# Patient Record
Sex: Male | Born: 1945
Health system: Southern US, Community
[De-identification: ages and names within clinical notes are randomized; demographics above are authoritative.]

## PROBLEM LIST (undated history)

## (undated) DIAGNOSIS — J449 Chronic obstructive pulmonary disease, unspecified: Secondary | ICD-10-CM

## (undated) DIAGNOSIS — I700 Atherosclerosis of aorta: Secondary | ICD-10-CM

## (undated) DIAGNOSIS — M1990 Unspecified osteoarthritis, unspecified site: Secondary | ICD-10-CM

## (undated) DIAGNOSIS — I639 Cerebral infarction, unspecified: Secondary | ICD-10-CM

## (undated) DIAGNOSIS — N189 Chronic kidney disease, unspecified: Secondary | ICD-10-CM

## (undated) DIAGNOSIS — F32A Depression, unspecified: Secondary | ICD-10-CM

## (undated) DIAGNOSIS — E785 Hyperlipidemia, unspecified: Secondary | ICD-10-CM

## (undated) DIAGNOSIS — C801 Malignant (primary) neoplasm, unspecified: Secondary | ICD-10-CM

## (undated) DIAGNOSIS — M549 Dorsalgia, unspecified: Secondary | ICD-10-CM

## (undated) DIAGNOSIS — J189 Pneumonia, unspecified organism: Secondary | ICD-10-CM

## (undated) DIAGNOSIS — M109 Gout, unspecified: Secondary | ICD-10-CM

## (undated) DIAGNOSIS — I10 Essential (primary) hypertension: Secondary | ICD-10-CM

## (undated) HISTORY — PX: CATARACT EXTRACTION W/ INTRAOCULAR LENS IMPLANT: SHX1309

## (undated) HISTORY — PX: OTHER SURGICAL HISTORY: SHX169

## (undated) HISTORY — PX: FRACTURE SURGERY: SHX138

## (undated) HISTORY — DX: Cerebral infarction, unspecified: I63.9

---

## 1999-09-06 ENCOUNTER — Ambulatory Visit (HOSPITAL_COMMUNITY): Admission: RE | Admit: 1999-09-06 | Discharge: 1999-09-06 | Payer: Self-pay | Admitting: *Deleted

## 2003-02-12 ENCOUNTER — Ambulatory Visit: Admission: RE | Admit: 2003-02-12 | Discharge: 2003-02-12 | Payer: Self-pay | Admitting: Family Medicine

## 2003-11-27 ENCOUNTER — Inpatient Hospital Stay (HOSPITAL_COMMUNITY): Admission: EM | Admit: 2003-11-27 | Discharge: 2003-12-06 | Payer: Self-pay

## 2003-12-03 ENCOUNTER — Ambulatory Visit: Payer: Self-pay | Admitting: Physical Medicine & Rehabilitation

## 2004-01-14 ENCOUNTER — Encounter: Admission: RE | Admit: 2004-01-14 | Discharge: 2004-01-14 | Payer: Self-pay | Admitting: Family Medicine

## 2005-07-08 IMAGING — CR DG CHEST 1V PORT
1 series · 1 of 1 positions shown · non-contrast
Comparison: 27 November, 2003.

CLINICAL DATA: Question pneumonia.
 CHEST PORTABLE ? 11/29/03 AT 4942 HOURS

[view not recorded]
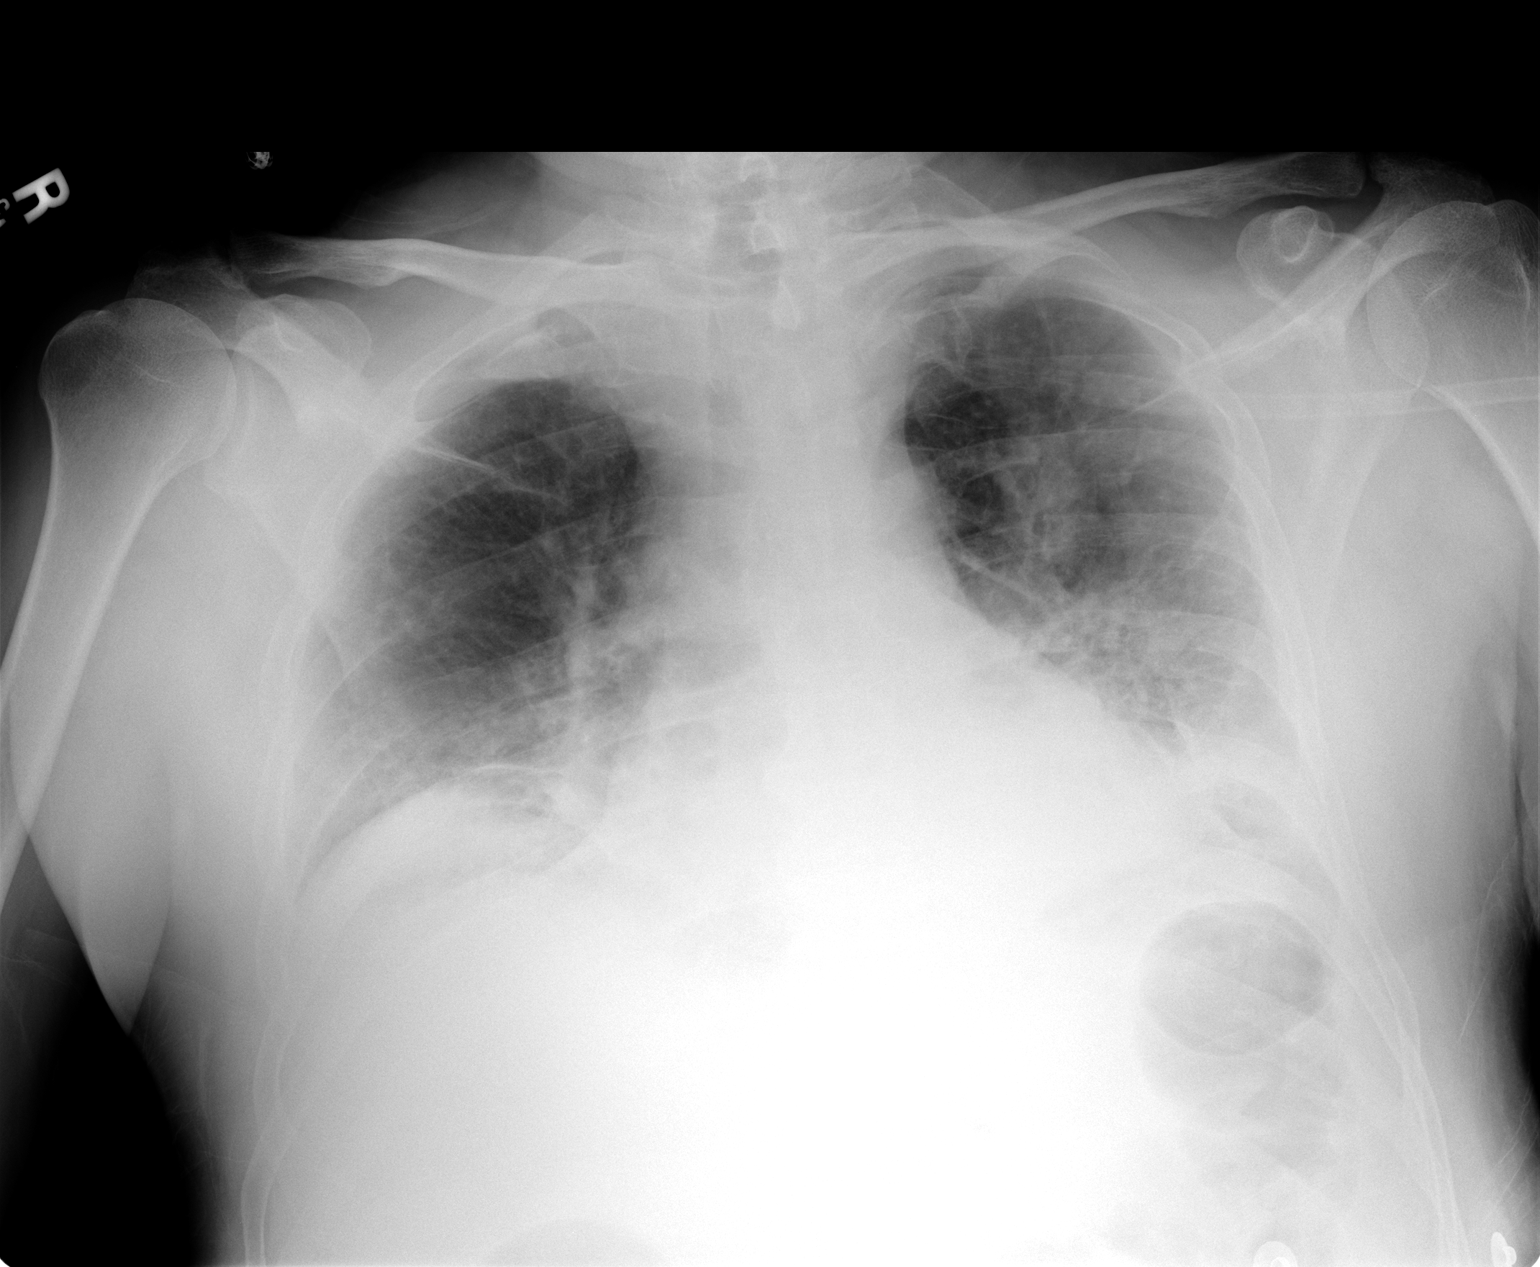

[1 of 1 positions shown; findings below may reference images not displayed]

Decreased lung volume.  Bibasilar atelectasis/infiltrate has developed since the prior study.  There is no heart failure.
 IMPRESSION
 Low lung volume with increase in bibasilar atelectasis/infiltrate.  Pneumonia is a possibility particularly on the left.

## 2005-07-09 IMAGING — CR DG CHEST 1V PORT
1 series · 1 of 1 positions shown · non-contrast
Comparison: none

CLINICAL DATA: 58-year-old with pneumonia. 
 PORTABLE CHEST  
 A single portable view of the chest is compared with prior films from 11/27/03 and 11/29/03.  Persistent low lung volumes with vascular crowding, bibasilar atelectasis, and probable small effusions.  I couldn?t exclude infiltrates.  No pneumothorax is seen. 
 IMPRESSION
 1.  Stable chest x-ray since yesterday?s study with low lung volumes and bibasilar opacity.

[view not recorded]
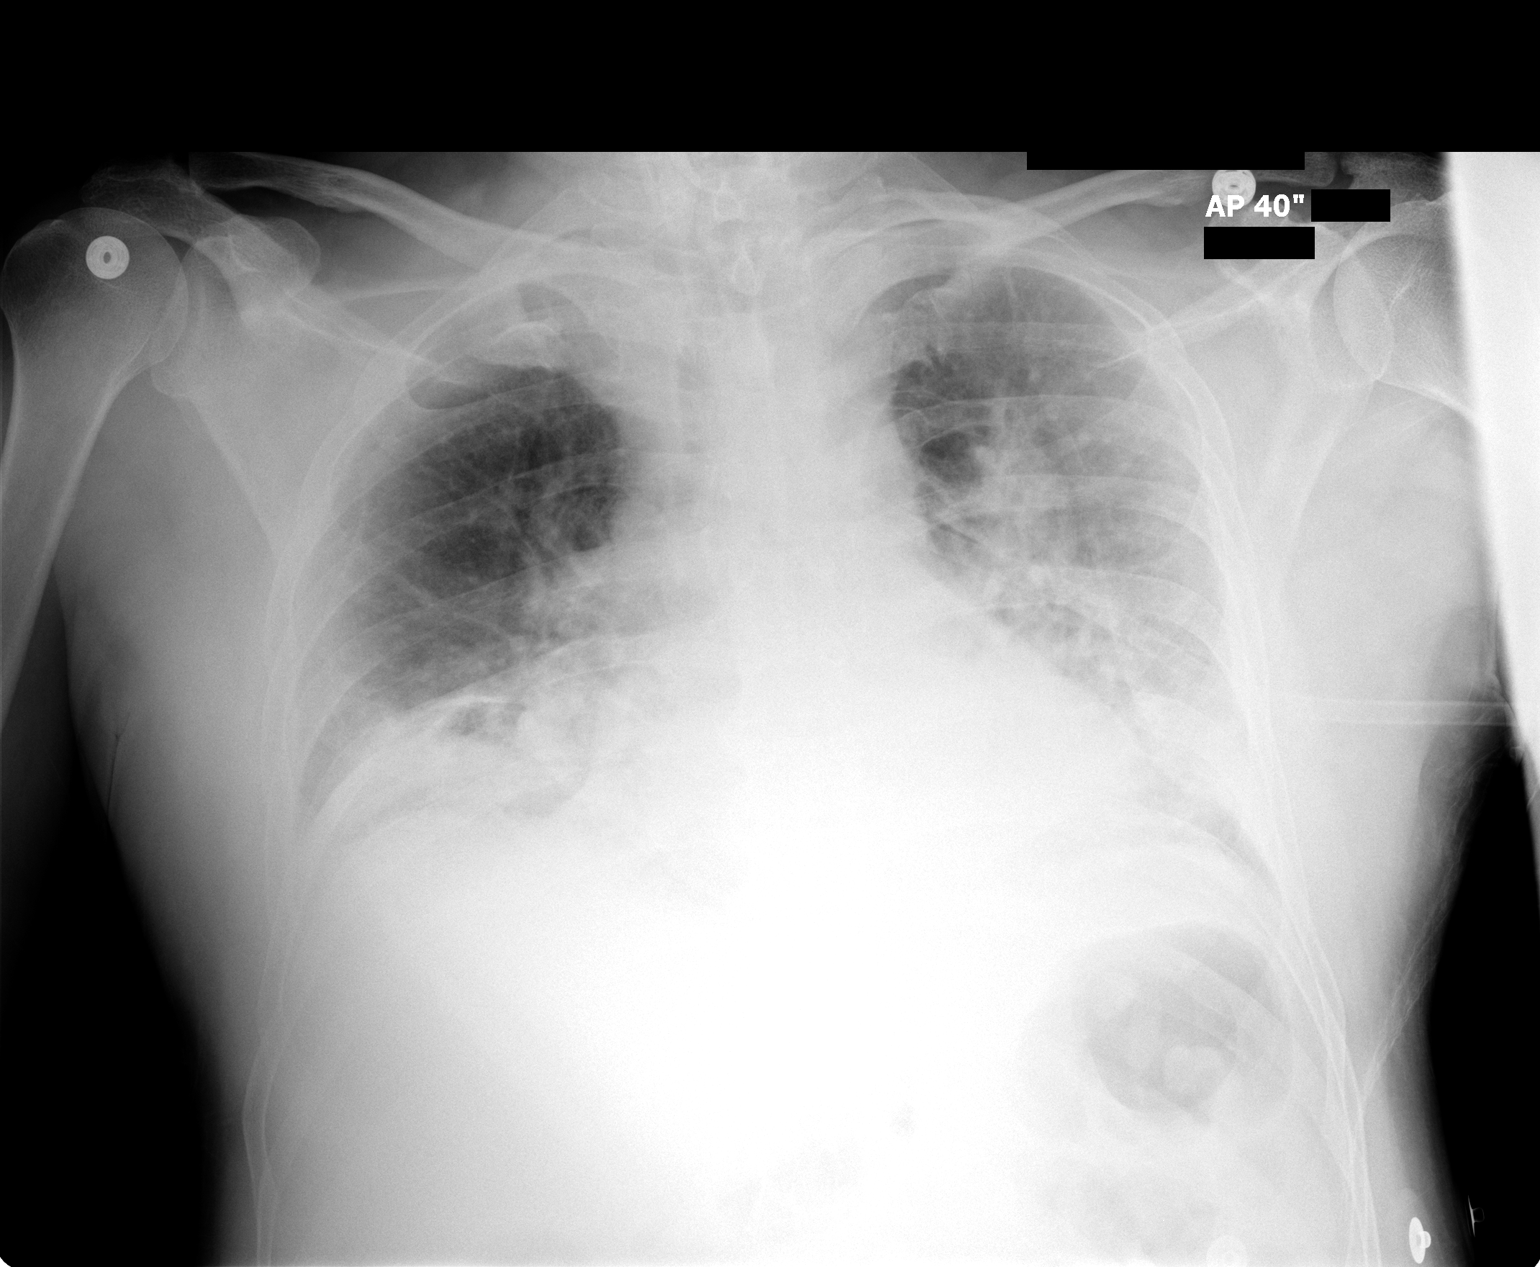

[1 of 1 positions shown; findings below may reference images not displayed]

## 2007-06-19 ENCOUNTER — Emergency Department: Payer: Self-pay | Admitting: Emergency Medicine

## 2008-03-28 HISTORY — PX: BACK SURGERY: SHX140

## 2014-09-16 DIAGNOSIS — I10 Essential (primary) hypertension: Secondary | ICD-10-CM | POA: Diagnosis not present

## 2014-09-16 DIAGNOSIS — M542 Cervicalgia: Secondary | ICD-10-CM | POA: Diagnosis not present

## 2014-09-16 DIAGNOSIS — Z79899 Other long term (current) drug therapy: Secondary | ICD-10-CM | POA: Diagnosis not present

## 2014-09-16 DIAGNOSIS — Z1389 Encounter for screening for other disorder: Secondary | ICD-10-CM | POA: Diagnosis not present

## 2014-09-16 DIAGNOSIS — Z9181 History of falling: Secondary | ICD-10-CM | POA: Diagnosis not present

## 2014-09-16 DIAGNOSIS — M109 Gout, unspecified: Secondary | ICD-10-CM | POA: Diagnosis not present

## 2014-09-16 DIAGNOSIS — E782 Mixed hyperlipidemia: Secondary | ICD-10-CM | POA: Diagnosis not present

## 2014-09-16 DIAGNOSIS — Z125 Encounter for screening for malignant neoplasm of prostate: Secondary | ICD-10-CM | POA: Diagnosis not present

## 2015-04-02 DIAGNOSIS — I10 Essential (primary) hypertension: Secondary | ICD-10-CM | POA: Diagnosis not present

## 2015-04-02 DIAGNOSIS — M542 Cervicalgia: Secondary | ICD-10-CM | POA: Diagnosis not present

## 2015-04-02 DIAGNOSIS — M109 Gout, unspecified: Secondary | ICD-10-CM | POA: Diagnosis not present

## 2015-04-02 DIAGNOSIS — E782 Mixed hyperlipidemia: Secondary | ICD-10-CM | POA: Diagnosis not present

## 2015-04-02 DIAGNOSIS — Z79899 Other long term (current) drug therapy: Secondary | ICD-10-CM | POA: Diagnosis not present

## 2015-04-02 DIAGNOSIS — Z1389 Encounter for screening for other disorder: Secondary | ICD-10-CM | POA: Diagnosis not present

## 2015-07-01 DIAGNOSIS — Z720 Tobacco use: Secondary | ICD-10-CM | POA: Diagnosis not present

## 2015-07-01 DIAGNOSIS — Z1389 Encounter for screening for other disorder: Secondary | ICD-10-CM | POA: Diagnosis not present

## 2015-07-01 DIAGNOSIS — F17200 Nicotine dependence, unspecified, uncomplicated: Secondary | ICD-10-CM | POA: Diagnosis not present

## 2015-07-01 DIAGNOSIS — M542 Cervicalgia: Secondary | ICD-10-CM | POA: Diagnosis not present

## 2015-10-01 DIAGNOSIS — Z79899 Other long term (current) drug therapy: Secondary | ICD-10-CM | POA: Diagnosis not present

## 2015-10-01 DIAGNOSIS — Z9181 History of falling: Secondary | ICD-10-CM | POA: Diagnosis not present

## 2015-10-01 DIAGNOSIS — Z125 Encounter for screening for malignant neoplasm of prostate: Secondary | ICD-10-CM | POA: Diagnosis not present

## 2015-10-01 DIAGNOSIS — I10 Essential (primary) hypertension: Secondary | ICD-10-CM | POA: Diagnosis not present

## 2015-10-01 DIAGNOSIS — M542 Cervicalgia: Secondary | ICD-10-CM | POA: Diagnosis not present

## 2015-10-01 DIAGNOSIS — H6120 Impacted cerumen, unspecified ear: Secondary | ICD-10-CM | POA: Diagnosis not present

## 2015-10-01 DIAGNOSIS — M109 Gout, unspecified: Secondary | ICD-10-CM | POA: Diagnosis not present

## 2015-10-01 DIAGNOSIS — E782 Mixed hyperlipidemia: Secondary | ICD-10-CM | POA: Diagnosis not present

## 2015-10-04 ENCOUNTER — Encounter (HOSPITAL_COMMUNITY): Payer: Self-pay | Admitting: Adult Health

## 2015-10-04 ENCOUNTER — Emergency Department (HOSPITAL_COMMUNITY): Payer: Medicare Other

## 2015-10-04 ENCOUNTER — Inpatient Hospital Stay (HOSPITAL_COMMUNITY)
Admission: EM | Admit: 2015-10-04 | Discharge: 2015-10-06 | DRG: 041 | Disposition: A | Discharge status: Home / Self Care | Payer: Medicare Other | Attending: Family Medicine | Admitting: Family Medicine

## 2015-10-04 DIAGNOSIS — R270 Ataxia, unspecified: Secondary | ICD-10-CM | POA: Diagnosis not present

## 2015-10-04 DIAGNOSIS — R7989 Other specified abnormal findings of blood chemistry: Secondary | ICD-10-CM | POA: Diagnosis present

## 2015-10-04 DIAGNOSIS — R29706 NIHSS score 6: Secondary | ICD-10-CM | POA: Diagnosis present

## 2015-10-04 DIAGNOSIS — F17210 Nicotine dependence, cigarettes, uncomplicated: Secondary | ICD-10-CM | POA: Diagnosis present

## 2015-10-04 DIAGNOSIS — Z981 Arthrodesis status: Secondary | ICD-10-CM | POA: Diagnosis not present

## 2015-10-04 DIAGNOSIS — I10 Essential (primary) hypertension: Secondary | ICD-10-CM | POA: Diagnosis not present

## 2015-10-04 DIAGNOSIS — N289 Disorder of kidney and ureter, unspecified: Secondary | ICD-10-CM | POA: Diagnosis present

## 2015-10-04 DIAGNOSIS — Z538 Procedure and treatment not carried out for other reasons: Secondary | ICD-10-CM | POA: Diagnosis not present

## 2015-10-04 DIAGNOSIS — E785 Hyperlipidemia, unspecified: Secondary | ICD-10-CM | POA: Diagnosis present

## 2015-10-04 DIAGNOSIS — I6340 Cerebral infarction due to embolism of unspecified cerebral artery: Secondary | ICD-10-CM

## 2015-10-04 DIAGNOSIS — R29810 Facial weakness: Secondary | ICD-10-CM | POA: Diagnosis present

## 2015-10-04 DIAGNOSIS — Z8673 Personal history of transient ischemic attack (TIA), and cerebral infarction without residual deficits: Secondary | ICD-10-CM | POA: Diagnosis not present

## 2015-10-04 DIAGNOSIS — G8194 Hemiplegia, unspecified affecting left nondominant side: Secondary | ICD-10-CM | POA: Diagnosis present

## 2015-10-04 DIAGNOSIS — R001 Bradycardia, unspecified: Secondary | ICD-10-CM | POA: Diagnosis present

## 2015-10-04 DIAGNOSIS — I6789 Other cerebrovascular disease: Secondary | ICD-10-CM | POA: Diagnosis not present

## 2015-10-04 DIAGNOSIS — G459 Transient cerebral ischemic attack, unspecified: Secondary | ICD-10-CM | POA: Diagnosis present

## 2015-10-04 DIAGNOSIS — I63511 Cerebral infarction due to unspecified occlusion or stenosis of right middle cerebral artery: Secondary | ICD-10-CM | POA: Diagnosis not present

## 2015-10-04 DIAGNOSIS — G8929 Other chronic pain: Secondary | ICD-10-CM | POA: Diagnosis present

## 2015-10-04 DIAGNOSIS — I63411 Cerebral infarction due to embolism of right middle cerebral artery: Secondary | ICD-10-CM | POA: Diagnosis not present

## 2015-10-04 DIAGNOSIS — R471 Dysarthria and anarthria: Secondary | ICD-10-CM | POA: Diagnosis not present

## 2015-10-04 DIAGNOSIS — I6359 Cerebral infarction due to unspecified occlusion or stenosis of other cerebral artery: Secondary | ICD-10-CM | POA: Diagnosis not present

## 2015-10-04 DIAGNOSIS — M109 Gout, unspecified: Secondary | ICD-10-CM | POA: Diagnosis not present

## 2015-10-04 DIAGNOSIS — R4781 Slurred speech: Secondary | ICD-10-CM | POA: Diagnosis not present

## 2015-10-04 DIAGNOSIS — I639 Cerebral infarction, unspecified: Secondary | ICD-10-CM | POA: Diagnosis not present

## 2015-10-04 HISTORY — DX: Essential (primary) hypertension: I10

## 2015-10-04 HISTORY — DX: Gout, unspecified: M10.9

## 2015-10-04 HISTORY — DX: Hyperlipidemia, unspecified: E78.5

## 2015-10-04 HISTORY — DX: Dorsalgia, unspecified: M54.9

## 2015-10-04 LAB — COMPREHENSIVE METABOLIC PANEL
ALBUMIN: 3.8 g/dL (ref 3.5–5.0)
ALK PHOS: 41 U/L (ref 38–126)
ALT: 8 U/L — ABNORMAL LOW (ref 17–63)
ANION GAP: 8 (ref 5–15)
AST: 20 U/L (ref 15–41)
BILIRUBIN TOTAL: 0.6 mg/dL (ref 0.3–1.2)
BUN: 36 mg/dL — ABNORMAL HIGH (ref 6–20)
CALCIUM: 9.4 mg/dL (ref 8.9–10.3)
CO2: 24 mmol/L (ref 22–32)
Chloride: 105 mmol/L (ref 101–111)
Creatinine, Ser: 1.96 mg/dL — ABNORMAL HIGH (ref 0.61–1.24)
GFR calc Af Amer: 38 mL/min — ABNORMAL LOW (ref >60)
GFR calc non Af Amer: 33 mL/min — ABNORMAL LOW (ref >60)
GLUCOSE: 92 mg/dL (ref 65–99)
Potassium: 4.2 mmol/L (ref 3.5–5.1)
SODIUM: 137 mmol/L (ref 135–145)
TOTAL PROTEIN: 6.7 g/dL (ref 6.5–8.1)

## 2015-10-04 LAB — DIFFERENTIAL
BASOS ABS: 0 10*3/uL (ref 0.0–0.1)
BASOS PCT: 0 %
EOS ABS: 0.4 10*3/uL (ref 0.0–0.7)
EOS PCT: 4 %
LYMPHS ABS: 2.7 10*3/uL (ref 0.7–4.0)
Lymphocytes Relative: 28 %
Monocytes Absolute: 1.2 10*3/uL — ABNORMAL HIGH (ref 0.1–1.0)
Monocytes Relative: 12 %
NEUTROS PCT: 56 %
Neutro Abs: 5.3 10*3/uL (ref 1.7–7.7)

## 2015-10-04 LAB — TSH: TSH: 0.953 u[IU]/mL (ref 0.350–4.500)

## 2015-10-04 LAB — I-STAT CHEM 8, ED
BUN: 38 mg/dL — AB (ref 6–20)
CALCIUM ION: 1.22 mmol/L (ref 1.12–1.23)
CHLORIDE: 104 mmol/L (ref 101–111)
CREATININE: 2 mg/dL — AB (ref 0.61–1.24)
Glucose, Bld: 87 mg/dL (ref 65–99)
HCT: 43 % (ref 39.0–52.0)
Hemoglobin: 14.6 g/dL (ref 13.0–17.0)
Potassium: 4.2 mmol/L (ref 3.5–5.1)
Sodium: 139 mmol/L (ref 135–145)
TCO2: 24 mmol/L (ref 0–100)

## 2015-10-04 LAB — PROTIME-INR
INR: 1.01 (ref 0.00–1.49)
PROTHROMBIN TIME: 13.5 s (ref 11.6–15.2)

## 2015-10-04 LAB — CBC
HCT: 41.4 % (ref 39.0–52.0)
Hemoglobin: 13.8 g/dL (ref 13.0–17.0)
MCH: 32.3 pg (ref 26.0–34.0)
MCHC: 33.3 g/dL (ref 30.0–36.0)
MCV: 97 fL (ref 78.0–100.0)
PLATELETS: 213 10*3/uL (ref 150–400)
RBC: 4.27 MIL/uL (ref 4.22–5.81)
RDW: 14.4 % (ref 11.5–15.5)
WBC: 9.6 10*3/uL (ref 4.0–10.5)

## 2015-10-04 LAB — I-STAT TROPONIN, ED: Troponin i, poc: 0 ng/mL (ref 0.00–0.08)

## 2015-10-04 LAB — APTT: APTT: 30 s (ref 24–37)

## 2015-10-04 LAB — CBG MONITORING, ED: GLUCOSE-CAPILLARY: 94 mg/dL (ref 65–99)

## 2015-10-04 MED ORDER — THIAMINE HCL 100 MG/ML IJ SOLN
100.0000 mg | Freq: Every day | INTRAMUSCULAR | Status: DC
  Filled 2015-10-04: qty 2

## 2015-10-04 MED ORDER — MORPHINE SULFATE ER 15 MG PO TBCR
15.0000 mg | EXTENDED_RELEASE_TABLET | Freq: Two times a day (BID) | ORAL | Status: DC
  Administered 2015-10-04 – 2015-10-05 (×3): 15 mg via ORAL
  Filled 2015-10-04 (×3): qty 1

## 2015-10-04 MED ORDER — LORAZEPAM 2 MG/ML IJ SOLN: 1.0000 mg | Freq: Four times a day (QID) | INTRAMUSCULAR | Status: DC | PRN

## 2015-10-04 MED ORDER — LORAZEPAM 1 MG PO TABS: 1.0000 mg | ORAL_TABLET | Freq: Four times a day (QID) | ORAL | Status: DC | PRN

## 2015-10-04 MED ORDER — ASPIRIN 325 MG PO TABS
325.0000 mg | ORAL_TABLET | Freq: Every day | ORAL | Status: DC
  Administered 2015-10-05: 325 mg via ORAL
  Filled 2015-10-04: qty 1

## 2015-10-04 MED ORDER — HEPARIN SODIUM (PORCINE) 5000 UNIT/ML IJ SOLN
5000.0000 [IU] | Freq: Three times a day (TID) | INTRAMUSCULAR | Status: DC
  Administered 2015-10-04 – 2015-10-05 (×4): 5000 [IU] via SUBCUTANEOUS
  Filled 2015-10-04 (×4): qty 1

## 2015-10-04 MED ORDER — ADULT MULTIVITAMIN W/MINERALS CH
1.0000 | ORAL_TABLET | Freq: Every day | ORAL | Status: DC
  Administered 2015-10-04 – 2015-10-05 (×2): 1 via ORAL
  Filled 2015-10-04 (×2): qty 1

## 2015-10-04 MED ORDER — VITAMIN B-1 100 MG PO TABS
100.0000 mg | ORAL_TABLET | Freq: Every day | ORAL | Status: DC
  Administered 2015-10-04 – 2015-10-05 (×2): 100 mg via ORAL
  Filled 2015-10-04 (×2): qty 1

## 2015-10-04 MED ORDER — SODIUM CHLORIDE 0.9 % IV SOLN
INTRAVENOUS | Status: AC
  Administered 2015-10-04 – 2015-10-05 (×2): via INTRAVENOUS
  Filled 2015-10-04: qty 1000

## 2015-10-04 MED ORDER — NICOTINE 21 MG/24HR TD PT24
21.0000 mg | MEDICATED_PATCH | Freq: Every day | TRANSDERMAL | Status: DC
  Administered 2015-10-04 – 2015-10-05 (×2): 21 mg via TRANSDERMAL
  Filled 2015-10-04 (×2): qty 1

## 2015-10-04 MED ORDER — ASPIRIN 300 MG RE SUPP: 300.0000 mg | Freq: Every day | RECTAL | Status: DC

## 2015-10-04 MED ORDER — STROKE: EARLY STAGES OF RECOVERY BOOK
Freq: Once | Status: AC
  Administered 2015-10-05: 07:00:00

## 2015-10-04 MED ORDER — ASPIRIN 325 MG PO TABS
325.0000 mg | ORAL_TABLET | Freq: Once | ORAL | Status: AC
  Administered 2015-10-04: 325 mg via ORAL
  Filled 2015-10-04: qty 1

## 2015-10-04 MED ORDER — OXYCODONE-ACETAMINOPHEN 5-325 MG PO TABS
1.0000 | ORAL_TABLET | Freq: Three times a day (TID) | ORAL | Status: DC | PRN
  Administered 2015-10-04: 1 via ORAL
  Filled 2015-10-04: qty 1

## 2015-10-04 MED ORDER — SENNOSIDES-DOCUSATE SODIUM 8.6-50 MG PO TABS: 1.0000 | ORAL_TABLET | Freq: Every evening | ORAL | Status: DC | PRN

## 2015-10-04 MED ORDER — FOLIC ACID 1 MG PO TABS
1.0000 mg | ORAL_TABLET | Freq: Every day | ORAL | Status: DC
  Administered 2015-10-04 – 2015-10-05 (×2): 1 mg via ORAL
  Filled 2015-10-04 (×2): qty 1

## 2015-10-04 NOTE — Progress Notes (Signed)
Patient and wife report that patient has metal dental implants as well as left arm plate and screws.  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

## 2015-10-04 NOTE — H&P (Signed)
[facility] Admission History and Physical Service Pager: [phone]  Patient name: Bobby Nguyen Medical record number: [id number] Date of birth: [birth year] Age: 70 y.o. Gender: male  Primary Care Provider: No primary care provider on file. Consultants: Neurology Code Status: Full  Chief Complaint: CVA  Assessment and Plan: Bobby Nguyen is a 70 y.o. male presenting with Right MCA Infarct. PMH is significant for hypertension, hyperlipidemia, tobacco abuse, and gout.   # CVA: Presenting with slurred speech, right and left upper extremity numbness and weakness, left facial weakness and facial droop. Symptoms have slowly been improving since arrival to ED. Troponin negative. INR 1.01. EKG with normal sinus rhythm. CT with acute infarct of right MCA. Passed swallow screen in ED.  - Cardiac Monitoring - A1C, Lipid Panel, TSH - CXR - Echocardiogram - Carotid Dopplers - Repeat CT in 24hr. MRI contraindicated given hardware from spinal fusion. - Neurology consulted in ED. Appreciate recommendations. - Aspirin - Physical Therapy/Occupational Therapy/SLP  # Hypertension: Home medications include Atenolol 50mg  and Lisinopril 40mg . - Hold antihypertensives to allow for permissive hypertension  # Elevated Creatinine: Creatinine 2 at admission. No prior creatinine for comparison. - NS@125cc /hr x12hr - Check BMP in morning - Holding home Lisinopril.  # Hyperlipidemia: - Not currently on statin. Consider initiating during hospitalization. - Lipid Panel  # Chronic Pain: Home medications include Morphine 15mg  q12hr, Percocet 5-325mg  q8hr as needed. - Continue home medications.  # Tobacco Abuse: Smokes 2ppd - Nicotine Patch  # Alcohol Use: Reports 3-4beers or more daily - CIWA  FEN/GI: Heart Healthy, NS@125cc /hr x12hr Prophylaxis: Heparin  Disposition: Home  History of Present Illness:  Bobby Nguyen is a 70 y.o. male presenting with left sided facial  droop, dysarthria, left sided upper and lower extremity numbness and facial droop. Awakened in the middle of the night with left arm numbness, feeling like he "didn't have a hand." Went back to sleep and then noticed mild upper and lower extremity numbness and weakness when he got up around 7:30am. Went to church and then ate lunch before taking a nap; upon awakening from his nap he noticed that his symptoms significantly worsened. Family noticed slurred speech and facial droop. Had difficulty ambulating due to left sided weakness and numbness. Denies chest pain, palpitations, nausea, vomiting. Does note mild headache. Family denies confusion.  Bobby Nguyen and family note significant improvement since arrival to ED although symptoms have not resolved. ED consulted Neurology. Aspirin given.  Reports history of hypertension, hyperlipidemia, and chronic pain from history of spinal fusion of thoracic spine.Still works as a Building control surveyor. Drinks 3-4beers a day and occasionally more. Smokes 2ppd of cigarettes, smoking since he was 70yo. Would like nicotine patch.  Review Of Systems: Per HPI  Otherwise the remainder of the systems were negative.  Patient Active Problem List   Diagnosis Date Noted  . Acute ischemic right middle cerebral artery (MCA) stroke ([place]) 10/04/2015  . TIA (transient ischemic attack) 10/04/2015   Past Medical History: Past Medical History  Diagnosis Date  . Back pain   . Hypertension   . Gout   . Hyperlipidemia     Past Surgical History: Past Surgical History  Procedure Laterality Date  . Arm surgery      Social History: Social History  Substance Use Topics  . Smoking status: Current Every Day Smoker    Types: Cigarettes  . Smokeless tobacco: None  . Alcohol Use: 2.4 oz/week    4 Cans of beer per  week   Please also refer to relevant sections of EMR.  Family History: Father with Cerebral Aneurysm  Allergies and Medications: Allergies  Allergen Reactions  .  Penicillins Itching, Swelling and Rash   Objective: BP 156/55 mmHg  Pulse 57  Temp(Src) 98.9 F (37.2 C) (Oral)  Resp 18  Ht 5' 8.5" (1.74 m)  Wt 169 lb 1.5 oz (76.7 kg)  BMI 25.33 kg/m2  SpO2 100% Exam: General: 70yo male resting comfortably in no apparent distress Eyes: PERRLA, White Sclera, EOM intact ENTM: Moist mucous membranes Neck: Stiff secondary to fusion Cardiovascular: S1 and S2 noted, no murmurs, regular rate and rhythm Respiratory: Bibasilar crackles noted, No increased work of breathing Abdomen: Soft and nondistended, nontender MSK: No edema noted, Muscle strength 5/5 in right upper and lower extremity and 4/5 in left upper and lower extremity. Skin: No rashes noted. Warm. Neuro: Slightly decreased but improving sensation over left upper and lower extremities, CN II-XII normal except for V (decreased facial sensation on left), Slurred and Delayed Speech. Psych: Alert, Oriented x3  Labs and Imaging: CBC BMET   Recent Labs Lab 10/04/15 1840 10/04/15 1850  WBC 9.6  --   HGB 13.8 14.6  HCT 41.4 43.0  PLT 213  --     Recent Labs Lab 10/04/15 1840 10/04/15 1850  NA 137 139  K 4.2 4.2  CL 105 104  CO2 24  --   BUN 36* 38*  CREATININE 1.96* 2.00*  GLUCOSE 92 87  CALCIUM 9.4  --     - Troponin 0  Ct Head Code Stroke W/o Cm  10/04/2015  CLINICAL DATA:  Left-sided facial droop and dysarthria. Left arm numbness. Symptoms began 20 hours ago approximately. Worsening of symptoms during the day. EXAM: CT HEAD WITHOUT CONTRAST TECHNIQUE: Contiguous axial images were obtained from the base of the skull through the vertex without intravenous contrast. COMPARISON:  06/19/2007 FINDINGS: The brainstem and cerebellum are normal. The left cerebral hemispheres normal except for mild chronic small-vessel change of the deep white matter. Right hemisphere shows low density at the frontoparietal junction region measuring about 3-4 cm in size consistent with late acute infarction.  Basal ganglia are spared. Insular brain is spared. No evidence of mass effect or hemorrhage. No hydrocephalus or extra-axial collection. I would consider this to a ball the M2, M3 and M 6 regions in therefore would assigned this aspects 7. IMPRESSION: Late acute infarction in the right MCA territory affecting M2, M3 and M6 regions consistent with aspects score 7. No mass effect or hemorrhage. Electronically Signed   By: Nelson Chimes M.D.   On: 10/04/2015 18:59   Lorna Few, DO 10/04/2015, 9:14 PM PGY-3, [facility] pager: [phone], text pages welcome

## 2015-10-04 NOTE — Progress Notes (Signed)
Patient arrival to room 5M09 via stretcher from ED.  Patient alert and oriented in NAD.  Clear speech with appropriate responses.  Adequate ROM and MOE x4 with mild left arm weakness and mild sensory neglect noted.  Patient denies any pain, discomfort or disturbance at this time.  POC and treatment goals discussed with wife and granddaughter at bedside.  Cardiac monitor placed and verified.  Environmental comforts provided including urinal at right side of bed and reduced lighting and stimuli.  No further needs or concerns.  Will continue to monitor closely.

## 2015-10-04 NOTE — Consult Note (Signed)
Neurology Consultation Reason for Consult: stroke Referring Physician: Lyn Hollingshead  CC: numbness  History is obtained from: Patient  HPI: Bobby Nguyen is a 70 y.o. male who awoke this morning with numbness. He states that he may have had some trouble with blurred vision yesterday at some time, but is not certain. What is certain is that when he awoke this morning his entire left side was numb. He was also slurring his speech and had some difficulty walking. Due to this persisting, he came into the emergency room where a CT was performed which shows a right MCA branch distribution infarct.   LKW: Yesterday, unclear time tpa given?: no, out of window    ROS: A 14 point ROS was performed and is negative except as noted in the HPI.   Past Medical History  Diagnosis Date  . Back pain   . Hypertension   . Gout   . Hyperlipidemia      Family history: Father-aneurysm   Social History:  reports that he has been smoking Cigarettes.  He does not have any smokeless tobacco history on file. He reports that he drinks about 2.4 oz of alcohol per week. He reports that he does not use illicit drugs.   Exam: Current vital signs: BP 140/66 mmHg  Pulse 60  Temp(Src) 98.7 F (37.1 C) (Oral)  Resp 18  Ht 5' 8.5" (1.74 m)  Wt 76.658 kg (169 lb)  BMI 25.32 kg/m2  SpO2 94% Vital signs in last 24 hours: Temp:  [98.7 F (37.1 C)] 98.7 F (37.1 C) (07/09 1814) Pulse Rate:  [58-65] 60 (07/09 1930) Resp:  [11-18] 18 (07/09 1930) BP: (138-150)/(52-66) 140/66 mmHg (07/09 1930) SpO2:  [94 %-100 %] 94 % (07/09 1930) Weight:  [76.658 kg (169 lb)] 76.658 kg (169 lb) (07/09 1814)   Physical Exam  Constitutional: Appears well-developed and well-nourished.  Psych: Affect appropriate to situation Eyes: No scleral injection HENT: No OP obstrucion Head: Normocephalic.  Cardiovascular: Normal rate and regular rhythm.  Respiratory: Effort normal and breath sounds normal to anterior  ascultation GI: Soft.  No distension. There is no tenderness.  Skin: WDI  Neuro: Mental Status: Patient is awake, alert, oriented to person, place, month, year, and situation. Patient is able to give a clear and coherent history. No signs of aphasia. He does have mild neglect to sensory, but not visual Cranial Nerves: II: Visual Fields are full. Pupils are equal, round, and reactive to light.   III,IV, VI: EOMI without ptosis or diploplia.  V: Facial sensation is decreased on the left VII: Facial movement is symmetric.  VIII: hearing is intact to voice X: Uvula elevates symmetrically XI: Shoulder shrug is symmetric. XII: tongue is midline without atrophy or fasciculations.  Motor: Tone is normal. Bulk is normal. 5/5 strength was present in all four extremities.  Sensory: Sensation is decreased throughout the left side, he does extinguish to double simultaneous stimulation Cerebellar: Finger-nose-finger is noticeably slower on the left than right      I have reviewed labs in epic and the results pertinent to this consultation are: Elevated creatinine  I have reviewed the images obtained: CT head-MCA distribution infarct on the right  Impression: 70 year old male with acute infarct that is likely embolic. Given his elevated creatinine, I would not pursue CT angiogram. 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 He will need further workup.  Recommendations: 1. HgbA1c, fasting lipid panel 2. Repeat  CT in 24 hours to see if there are other areas of potential embolus that are not completely visible at this time. 3. Frequent neuro checks 4. Echocardiogram 5. Carotid dopplers 6. Prophylactic therapy-Antiplatelet med: Aspirin - dose 325mg  PO or 300mg  PR 7. Risk factor modification 8. Telemetry monitoring 9. PT consult, OT consult, Speech consult 10. please page stroke NP  Or  PA  Or MD  M-F from 8am -4 pm starting 7/10 as this patient  will be followed by the stroke team at this point.   You can look them up on [email or web address]      Roland Rack, MD [facility] [phone]  If 7pm- 7am, please page neurology on call as listed in [place].

## 2015-10-04 NOTE — ED Provider Notes (Addendum)
CSN: [id number]     Arrival date & time 10/04/15  1805 History   First MD Initiated Contact with Patient 10/04/15 1841     Chief Complaint  Patient presents with  . Stroke Symptoms     (Consider location/radiation/quality/duration/timing/severity/associated sxs/prior Treatment) HPI Patient awakened sometime in the middle of the night with numbness in his left arm. His wife noted at 7:30 AM today that he had difficulty speaking, difficulty walking and the patient complained of numbness in his left arm and left leg and left face. No treatment prior to coming here he is somewhat improved since arrival here without treatment. No other associated symptoms. He denies pain anywhere. No other associated symptoms. Nothing makes symptoms better or worse Past Medical History  Diagnosis Date  . Back pain   . Hypertension   . Gout   . Hyperlipidemia    Past Surgical History  Procedure Laterality Date  . Arm surgery     History reviewed. No pertinent family history. Social History  Substance Use Topics  . Smoking status: Current Every Day Smoker    Types: Cigarettes  . Smokeless tobacco: None  . Alcohol Use: 2.4 oz/week    4 Cans of beer per week    Review of Systems  Constitutional: Negative.   HENT: Negative.   Respiratory: Negative.   Cardiovascular: Negative.   Gastrointestinal: Negative.   Musculoskeletal: Positive for gait problem.  Skin: Negative.   Neurological: Positive for facial asymmetry, speech difficulty and numbness.  Psychiatric/Behavioral: Negative.   All other systems reviewed and are negative.     Allergies  Penicillins  Home Medications   Prior to Admission medications   Not on File   BP 141/52 mmHg  Pulse 58  Temp(Src) 98.7 F (37.1 C) (Oral)  Resp 11  Ht 5' 8.5" (1.74 m)  Wt 169 lb (76.658 kg)  BMI 25.32 kg/m2  SpO2 96% Physical Exam  Constitutional: He is oriented to person, place, and time. He appears well-developed and well-nourished. No  distress.  HENT:  Head: Normocephalic and atraumatic.  Left mouth droop  Eyes: Conjunctivae are normal. Pupils are equal, round, and reactive to light.  Neck: Neck supple. No tracheal deviation present. No thyromegaly present.  Cardiovascular: Normal rate and regular rhythm.   No murmur heard. Pulmonary/Chest: Effort normal and breath sounds normal.  Abdominal: Soft. Bowel sounds are normal. He exhibits no distension. There is no tenderness.  Musculoskeletal: Normal range of motion. He exhibits no edema or tenderness.  Neurological: He is alert and oriented to person, place, and time. Coordination normal.  Left-sided central cranial nerves VII deficit speech slightly slurred. Her strength 4/5 in all 4 extremities.  Skin: Skin is warm and dry. No rash noted.  Psychiatric: He has a normal mood and affect.  Nursing note and vitals reviewed.   ED Course  Procedures (including critical care time) Labs Review Labs Reviewed  PROTIME-INR  APTT  CBC  DIFFERENTIAL  COMPREHENSIVE METABOLIC PANEL  I-STAT TROPOININ, ED  CBG MONITORING, ED  I-STAT CHEM 8, ED    Imaging Review No results found. I have personally reviewed and evaluated these images and lab results as part of my medical decision-making.   EKG Interpretation   Date/Time:  Sunday October 04 2015 18:10:30 EDT Ventricular Rate:  65 PR Interval:    QRS Duration: 105 QT Interval:  417 QTC Calculation: 434 R Axis:   28 Text Interpretation:  Sinus rhythm Consider left atrial enlargement No  significant change since last  tracing Confirmed by Winfred Leeds  MD, [place]  [id number]) on 10/04/2015 6:54:43 PM     NIH stroke scale calculated 6 by nursing Results for orders placed or performed during the hospital encounter of 10/04/15  Protime-INR  Result Value Ref Range   Prothrombin Time 13.5 11.6 - 15.2 seconds   INR 1.01 0.00 - 1.49  APTT  Result Value Ref Range   aPTT 30 24 - 37 seconds  CBC  Result Value Ref Range   WBC 9.6 4.0 -  10.5 K/uL   RBC 4.27 4.22 - 5.81 MIL/uL   Hemoglobin 13.8 13.0 - 17.0 g/dL   HCT 41.4 39.0 - 52.0 %   MCV 97.0 78.0 - 100.0 fL   MCH 32.3 26.0 - 34.0 pg   MCHC 33.3 30.0 - 36.0 g/dL   RDW 14.4 11.5 - 15.5 %   Platelets 213 150 - 400 K/uL  Differential  Result Value Ref Range   Neutrophils Relative % 56 %   Neutro Abs 5.3 1.7 - 7.7 K/uL   Lymphocytes Relative 28 %   Lymphs Abs 2.7 0.7 - 4.0 K/uL   Monocytes Relative 12 %   Monocytes Absolute 1.2 (H) 0.1 - 1.0 K/uL   Eosinophils Relative 4 %   Eosinophils Absolute 0.4 0.0 - 0.7 K/uL   Basophils Relative 0 %   Basophils Absolute 0.0 0.0 - 0.1 K/uL  Comprehensive metabolic panel  Result Value Ref Range   Sodium 137 135 - 145 mmol/L   Potassium 4.2 3.5 - 5.1 mmol/L   Chloride 105 101 - 111 mmol/L   CO2 24 22 - 32 mmol/L   Glucose, Bld 92 65 - 99 mg/dL   BUN 36 (H) 6 - 20 mg/dL   Creatinine, Ser 1.96 (H) 0.61 - 1.24 mg/dL   Calcium 9.4 8.9 - 10.3 mg/dL   Total Protein 6.7 6.5 - 8.1 g/dL   Albumin 3.8 3.5 - 5.0 g/dL   AST 20 15 - 41 U/L   ALT 8 (L) 17 - 63 U/L   Alkaline Phosphatase 41 38 - 126 U/L   Total Bilirubin 0.6 0.3 - 1.2 mg/dL   GFR calc non Af Amer 33 (L) >60 mL/min   GFR calc Af Amer 38 (L) >60 mL/min   Anion gap 8 5 - 15  I-stat troponin, ED  Result Value Ref Range   Troponin i, poc 0.00 0.00 - 0.08 ng/mL   Comment 3          CBG monitoring, ED  Result Value Ref Range   Glucose-Capillary 94 65 - 99 mg/dL  I-Stat Chem 8, ED  Result Value Ref Range   Sodium 139 135 - 145 mmol/L   Potassium 4.2 3.5 - 5.1 mmol/L   Chloride 104 101 - 111 mmol/L   BUN 38 (H) 6 - 20 mg/dL   Creatinine, Ser 2.00 (H) 0.61 - 1.24 mg/dL   Glucose, Bld 87 65 - 99 mg/dL   Calcium, Ion 1.22 1.12 - 1.23 mmol/L   TCO2 24 0 - 100 mmol/L   Hemoglobin 14.6 13.0 - 17.0 g/dL   HCT 43.0 39.0 - 52.0 %   Ct Head Code Stroke W/o Cm  10/04/2015  CLINICAL DATA:  Left-sided facial droop and dysarthria. Left arm numbness. Symptoms began 20 hours  ago approximately. Worsening of symptoms during the day. EXAM: CT HEAD WITHOUT CONTRAST TECHNIQUE: Contiguous axial images were obtained from the base of the skull through the vertex without intravenous contrast. COMPARISON:  06/19/2007  FINDINGS: The brainstem and cerebellum are normal. The left cerebral hemispheres normal except for mild chronic small-vessel change of the deep white matter. Right hemisphere shows low density at the frontoparietal junction region measuring about 3-4 cm in size consistent with late acute infarction. Basal ganglia are spared. Insular brain is spared. No evidence of mass effect or hemorrhage. No hydrocephalus or extra-axial collection. I would consider this to a ball the M2, M3 and M 6 regions in therefore would assigned this aspects 7. IMPRESSION: Late acute infarction in the right MCA territory affecting M2, M3 and M6 regions consistent with aspects score 7. No mass effect or hemorrhage. Electronically Signed   By: Nelson Chimes M.D.   On: 10/04/2015 18:59    MDM  Code stroke not called as patient is beyond timeframe. Patient past stroke swallow screen. Aspirin ordered by me. Dr. Leonel Ramsay from stroke service consulted. He will see patient in ED. I also consulted Dr.Diallo from family medicine service. Patient will be admitted to telemetry floor  Final diagnoses:  None   Dx #1 acute right middle cerebral artery stroke #2 renal insufficiency     Orlie Dakin, MD 10/04/15 [place], MD 10/04/15 1946

## 2015-10-04 NOTE — ED Notes (Signed)
Presents with left sided facial droop, dysarthria, left sided arm numbness and leg numbness and facial numbness began this am around midnight, woke up and throughout the day the symptoms have woprsened. At 2 pm he was resting in a recliner and had ataxia, and worsening numbness and facial droop. Pt is alert, oriented, left arm weak, numb,  Has some resisteance against gravity, left sided facial dropp, slight dysarthria at times, denies headache or pain. BP 150/58. HR 66

## 2015-10-05 ENCOUNTER — Observation Stay (HOSPITAL_COMMUNITY): Payer: Medicare Other

## 2015-10-05 ENCOUNTER — Observation Stay (HOSPITAL_BASED_OUTPATIENT_CLINIC_OR_DEPARTMENT_OTHER): Payer: Medicare Other

## 2015-10-05 DIAGNOSIS — I10 Essential (primary) hypertension: Secondary | ICD-10-CM | POA: Diagnosis present

## 2015-10-05 DIAGNOSIS — I63511 Cerebral infarction due to unspecified occlusion or stenosis of right middle cerebral artery: Secondary | ICD-10-CM

## 2015-10-05 DIAGNOSIS — R29810 Facial weakness: Secondary | ICD-10-CM | POA: Diagnosis present

## 2015-10-05 DIAGNOSIS — I6789 Other cerebrovascular disease: Secondary | ICD-10-CM

## 2015-10-05 DIAGNOSIS — R471 Dysarthria and anarthria: Secondary | ICD-10-CM | POA: Diagnosis present

## 2015-10-05 DIAGNOSIS — R4781 Slurred speech: Secondary | ICD-10-CM | POA: Diagnosis present

## 2015-10-05 DIAGNOSIS — I63411 Cerebral infarction due to embolism of right middle cerebral artery: Secondary | ICD-10-CM | POA: Diagnosis present

## 2015-10-05 DIAGNOSIS — M109 Gout, unspecified: Secondary | ICD-10-CM | POA: Diagnosis present

## 2015-10-05 DIAGNOSIS — R001 Bradycardia, unspecified: Secondary | ICD-10-CM | POA: Diagnosis not present

## 2015-10-05 DIAGNOSIS — I63231 Cerebral infarction due to unspecified occlusion or stenosis of right carotid arteries: Secondary | ICD-10-CM | POA: Diagnosis not present

## 2015-10-05 DIAGNOSIS — F17210 Nicotine dependence, cigarettes, uncomplicated: Secondary | ICD-10-CM | POA: Diagnosis present

## 2015-10-05 DIAGNOSIS — Z981 Arthrodesis status: Secondary | ICD-10-CM | POA: Diagnosis not present

## 2015-10-05 DIAGNOSIS — N289 Disorder of kidney and ureter, unspecified: Secondary | ICD-10-CM | POA: Diagnosis present

## 2015-10-05 DIAGNOSIS — R7989 Other specified abnormal findings of blood chemistry: Secondary | ICD-10-CM | POA: Diagnosis present

## 2015-10-05 DIAGNOSIS — I63232 Cerebral infarction due to unspecified occlusion or stenosis of left carotid arteries: Secondary | ICD-10-CM | POA: Diagnosis not present

## 2015-10-05 DIAGNOSIS — Z8673 Personal history of transient ischemic attack (TIA), and cerebral infarction without residual deficits: Secondary | ICD-10-CM | POA: Diagnosis not present

## 2015-10-05 DIAGNOSIS — G8929 Other chronic pain: Secondary | ICD-10-CM | POA: Diagnosis present

## 2015-10-05 DIAGNOSIS — G8194 Hemiplegia, unspecified affecting left nondominant side: Secondary | ICD-10-CM | POA: Diagnosis present

## 2015-10-05 DIAGNOSIS — I639 Cerebral infarction, unspecified: Secondary | ICD-10-CM | POA: Diagnosis not present

## 2015-10-05 DIAGNOSIS — Z538 Procedure and treatment not carried out for other reasons: Secondary | ICD-10-CM | POA: Diagnosis not present

## 2015-10-05 DIAGNOSIS — R270 Ataxia, unspecified: Secondary | ICD-10-CM | POA: Diagnosis present

## 2015-10-05 DIAGNOSIS — E785 Hyperlipidemia, unspecified: Secondary | ICD-10-CM | POA: Diagnosis present

## 2015-10-05 DIAGNOSIS — R29706 NIHSS score 6: Secondary | ICD-10-CM | POA: Diagnosis present

## 2015-10-05 LAB — VAS US CAROTID
LCCADDIAS: 10 cm/s
LCCAPDIAS: 8 cm/s
LEFT ECA DIAS: -9 cm/s
LEFT VERTEBRAL DIAS: 8 cm/s
LICADDIAS: -25 cm/s
LICADSYS: -140 cm/s
LICAPDIAS: -21 cm/s
LICAPSYS: -111 cm/s
Left CCA dist sys: 93 cm/s
Left CCA prox sys: 132 cm/s
RIGHT ECA DIAS: -14 cm/s
RIGHT VERTEBRAL DIAS: 7 cm/s
Right CCA prox dias: 9 cm/s
Right CCA prox sys: 108 cm/s
Right cca dist sys: -120 cm/s

## 2015-10-05 LAB — LIPID PANEL
CHOLESTEROL: 156 mg/dL (ref 0–200)
HDL: 41 mg/dL (ref >40)
LDL Cholesterol: 84 mg/dL (ref 0–99)
TRIGLYCERIDES: 155 mg/dL — AB (ref <150)
Total CHOL/HDL Ratio: 3.8 RATIO
VLDL: 31 mg/dL (ref 0–40)

## 2015-10-05 LAB — BASIC METABOLIC PANEL
Anion gap: 6 (ref 5–15)
BUN: 28 mg/dL — ABNORMAL HIGH (ref 6–20)
CHLORIDE: 106 mmol/L (ref 101–111)
CO2: 26 mmol/L (ref 22–32)
CREATININE: 1.62 mg/dL — AB (ref 0.61–1.24)
Calcium: 9.3 mg/dL (ref 8.9–10.3)
GFR calc non Af Amer: 41 mL/min — ABNORMAL LOW (ref >60)
GFR, EST AFRICAN AMERICAN: 48 mL/min — AB (ref >60)
Glucose, Bld: 96 mg/dL (ref 65–99)
POTASSIUM: 4 mmol/L (ref 3.5–5.1)
Sodium: 138 mmol/L (ref 135–145)

## 2015-10-05 LAB — ECHOCARDIOGRAM COMPLETE
E decel time: 187 msec
EERAT: 10.45
FS: 43 % (ref 28–44)
Height: 68.5 in
IV/PV OW: 0.74
LA diam end sys: 40 mm
LA diam index: 2.09 cm/m2
LA vol A4C: 66.5 ml
LA vol index: 37.5 mL/m2
LA vol: 71.6 mL
LASIZE: 40 mm
LV e' LATERAL: 9.57 cm/s
LVEEAVG: 10.45
LVEEMED: 10.45
LVOT area: 3.14 cm2
LVOTD: 20 mm
MV Dec: 187
MVPG: 4 mmHg
MVPKAVEL: 77.6 m/s
MVPKEVEL: 100 m/s
PW: 13.1 mm — AB (ref 0.6–1.1)
TDI e' lateral: 9.57
TDI e' medial: 7.29
Weight: 2705.49 oz

## 2015-10-05 MED ORDER — SODIUM CHLORIDE 0.9 % IV SOLN
INTRAVENOUS | Status: DC
  Administered 2015-10-05: 13:00:00 via INTRAVENOUS

## 2015-10-05 MED ORDER — SIMVASTATIN 20 MG PO TABS
20.0000 mg | ORAL_TABLET | Freq: Every day | ORAL | Status: DC
  Administered 2015-10-05: 20 mg via ORAL
  Filled 2015-10-05: qty 1

## 2015-10-05 NOTE — Progress Notes (Signed)
TEE/Loop recorder information given and reviewed with pt and daughter. Consent for TEE in chart. Pt to be NPO after MN.   Ave Filter, RN

## 2015-10-05 NOTE — Progress Notes (Signed)
    [facility] has been requested to perform a transesophageal echocardiogram on 10/06/2015 for CVA.  After careful review of history and examination, the risks and benefits of transesophageal echocardiogram have been explained including risks of esophageal damage, perforation (1:10,000 risk), bleeding, pharyngeal hematoma as well as other potential complications associated with conscious sedation including aspiration, arrhythmia, respiratory failure and death. Alternatives to treatment were discussed, questions were answered. Patient is willing to proceed.   I have reviewed his most recent labs and noted stable Hgb and Platelet level. Blood pressure has been stable without the need for pressers.   Reino Bellis, NP-C 10/05/2015 3:33 PM

## 2015-10-05 NOTE — Evaluation (Signed)
SLP Cancellation Note  Patient Details Name: Bobby Nguyen MRN: [id number] DOB: October 21, 1945   Cancelled treatment:       Reason Eval/Treat Not Completed: Other (comment) (pt just now working on eating a biscuit as he has not had breakfast, will continue efforts)   Luanna Salk, [facility] SLP [phone]

## 2015-10-05 NOTE — Progress Notes (Signed)
STROKE TEAM PROGRESS NOTE   HISTORY OF PRESENT ILLNESS (per record) Bobby Nguyen is a 70 y.o. male who awoke this morning 10/04/2015 (LKW 10/03/2015, time unknown) with numbness. He states that he may have had some trouble with blurred vision yesterday at some time, but is not certain. What is certain is that when he awoke this morning his entire left side was numb. He was also slurring his speech and had some difficulty walking. Due to this persisting, he came into the emergency room where a CT was performed which shows a right MCA branch distribution infarct. Patient was not administered IV t-PA secondary to being out of the window. He was admitted for further evaluation and treatment.   SUBJECTIVE (INTERVAL HISTORY) His family members are at the bedside.  Overall he feels his condition is stable. He is rapidly improving with his speech and strength but numbness persist.  OBJECTIVE Temp:  [97.9 F (36.6 C)-98.9 F (37.2 C)] 98 F (36.7 C) (07/10 1121) Pulse Rate:  [47-65] 51 (07/10 1121) Cardiac Rhythm:  [-] Normal sinus rhythm (07/10 0700) Resp:  [11-18] 16 (07/10 1121) BP: (111-156)/(49-71) 135/53 mmHg (07/10 1121) SpO2:  [94 %-100 %] 95 % (07/10 1121) Weight:  [76.658 kg (169 lb)-76.7 kg (169 lb 1.5 oz)] 76.7 kg (169 lb 1.5 oz) (07/09 2028)  CBC:   Recent Labs Lab 10/04/15 1840 10/04/15 1850  WBC 9.6  --   NEUTROABS 5.3  --   HGB 13.8 14.6  HCT 41.4 43.0  MCV 97.0  --   PLT 213  --     Basic Metabolic Panel:   Recent Labs Lab 10/04/15 1840 10/04/15 1850 10/05/15 1001  NA 137 139 138  K 4.2 4.2 4.0  CL 105 104 106  CO2 24  --  26  GLUCOSE 92 87 96  BUN 36* 38* 28*  CREATININE 1.96* 2.00* 1.62*  CALCIUM 9.4  --  9.3    Lipid Panel:     Component Value Date/Time   CHOL 156 10/05/2015 0658   TRIG 155* 10/05/2015 0658   HDL 41 10/05/2015 0658   CHOLHDL 3.8 10/05/2015 0658   VLDL 31 10/05/2015 0658   LDLCALC 84 10/05/2015 0658   HgbA1c: No results found for:  HGBA1C Urine Drug Screen: No results found for: LABOPIA, COCAINSCRNUR, LABBENZ, AMPHETMU, THCU, LABBARB    IMAGING  Dg Chest 2 View  10/05/2015  CLINICAL DATA:  Cerebral infarction. EXAM: CHEST  2 VIEW COMPARISON:  11/25/2009 FINDINGS: The heart size and mediastinal contours are within normal limits. Stable parenchymal scarring bilaterally. There is no evidence of pulmonary edema, consolidation, pneumothorax, nodule or pleural fluid. Stable appearance of old left-sided rib fractures. IMPRESSION: No active cardiopulmonary disease. Electronically Signed   By: Aletta Edouard M.D.   On: 10/05/2015 07:52   Ct Head Code Stroke W/o Cm  10/04/2015  CLINICAL DATA:  Left-sided facial droop and dysarthria. Left arm numbness. Symptoms began 20 hours ago approximately. Worsening of symptoms during the day. EXAM: CT HEAD WITHOUT CONTRAST TECHNIQUE: Contiguous axial images were obtained from the base of the skull through the vertex without intravenous contrast. COMPARISON:  06/19/2007 FINDINGS: The brainstem and cerebellum are normal. The left cerebral hemispheres normal except for mild chronic small-vessel change of the deep white matter. Right hemisphere shows low density at the frontoparietal junction region measuring about 3-4 cm in size consistent with late acute infarction. Basal ganglia are spared. Insular brain is spared. No evidence of mass effect or hemorrhage. No  hydrocephalus or extra-axial collection. I would consider this to a ball the M2, M3 and M 6 regions in therefore would assigned this aspects 7. IMPRESSION: Late acute infarction in the right MCA territory affecting M2, M3 and M6 regions consistent with aspects score 7. No mass effect or hemorrhage. Electronically Signed   By: Nelson Chimes M.D.   On: 10/04/2015 18:59   Carotid Doppler   There is 1-39% bilateral ICA stenosis. Vertebral artery flow is antegrade.      PHYSICAL EXAM Pleasant elderly Caucasian male currently not in distress. .  Afebrile. Head is nontraumatic. Neck is supple without bruit.    Cardiac exam no murmur or gallop. Lungs are clear to auscultation. Distal pulses are well felt. Neurological Exam :  Awake alert oriented to time place and person. No aphasia, apraxia dysarthria. Follows commands well. Pupils equal reactive. Fundi not visualized. Vision acuity and fields are adequate. Face is symmetric without weakness. Tongue is midline. Motor system exam no upper or lower extremity drift but mild weakness of left grip and intrinsic hand muscles. Orbits right over left upper extremity. Subjective diminished left hemibody sensation to touch and pinprick. Deep tendon reflexes are 2+ symmetric. Plantars downgoing. Gait was not tested. ASSESSMENT/PLAN [name] is a 70 y.o. male with history of chronic back pain, HTN, gout and HLD presenting with slurred speech and difficulty walking. He did not receive IV t-PA due to being out of the window.   Stroke:  Non-dominant right MCA infarct embolic secondary to unknown source  Resultant  Left hemiparesis, dysarthria, L hemisensory deficit  CT old R MCA infarct, aspects 7  MRI  / MRA  Metal dental implats, L arm plate and screws, works as a Building control surveyor  Carotid Doppler  No significant stenosis   2D Echo  pending  TEE to look for embolic source. Arranged with [facility] for tomorrow.  If positive for PFO (patent foramen ovale), check bilateral lower extremity venous dopplers to rule out DVT as possible source of stroke. (I have made patient NPO after midnight tonight).  If TEE negative, a [facility] electrophysiologist will consult and consider placement of an implantable loop recorder to evaluate for atrial fibrillation as etiology of stroke. This has been explained to patient/family by Dr. Leonie Man and they are agreeable.  Check TCD Check CTA head and neck as creatinine is decreasing - continue IVF  LDL 84  HgbA1c  pending  Heparin 5000 units sq tid for VTE prophylaxis Diet Heart Room service appropriate?: Yes; Fluid consistency:: Thin  No antithrombotic prior to admission, now on aspirin 325 mg daily  Patient counseled to be compliant with his antithrombotic medications  Ongoing aggressive stroke risk factor management  Counseled on Healthy diet and active life style  Therapy recommendations:  pending   Disposition:  pending   Hyperlipidemia  Home meds:  No statin  LDL 84, goal < 70  Add zocor 20 mg  Continue statin at discharge  Other Stroke Risk Factors  Advanced age  Cigarette smoker, advised to stop smoking  ETOH use, advised to drink no more than 2 drink(s) a day  Family hx stroke (father - aneurysm)  Other Active Problems  Elevated creatinine 2.0  Chronic pain  Asymptomatic bradycardia  Hospital day # 1  [facility] See Amion for Pager information 10/05/2015 11:27 AM  I have personally examined this patient, reviewed notes, independently viewed imaging studies, participated in medical  decision making and plan of care. I have made any additions or clarifications directly to the above note. Agree with note above. He presented with left sided weakness and numbness secondary to embolic right MCA branch infarct etiology to be determined. She remains at risk for neurological worsening, recurrent stroke, TIA. Recommend check transesophageal echocardiogram tomorrow and if negative loop recorder for paroxysmal atrial fibrillation. I had a long discussion with the patient and wife and daughter and answered questions.Start aspirin. Greater than 50% of time during this 35 minute visit was spent on counseling and coordination of care about stroke risk, evaluation, prevention and treatment   Antony Contras, MD Medical Director [facility] Pager: [phone] 10/05/2015 2:07 PM    To contact Stroke Continuity provider, please refer to  [email or web address] After hours, contact General Neurology

## 2015-10-05 NOTE — Progress Notes (Signed)
*  PRELIMINARY RESULTS* Vascular Ultrasound Carotid Duplex (Doppler) has been completed.  Preliminary findings: Bilateral: No significant (1-39%) ICA stenosis. Antegrade vertebral flow.    Landry Mellow, RDMS, RVT  10/05/2015, 10:56 AM

## 2015-10-05 NOTE — Care Management Note (Signed)
Case Management Note  Patient Details  Name: Bobby Nguyen MRN: [id number] Date of Birth: 12-25-1945  Subjective/Objective:     Patient admitted with CVA. He is from home with his spouse.                Action/Plan: Awaiting PT/OT recommendations. CM following for discharge needs.  Expected Discharge Date:                  Expected Discharge Plan:     In-House Referral:     Discharge planning Services     Post Acute Care Choice:    Choice offered to:     DME Arranged:    DME Agency:     HH Arranged:    HH Agency:     Status of Service:  In process, will continue to follow  If discussed at Long Length of Stay Meetings, dates discussed:    Additional Comments:  Pollie Friar, RN 10/05/2015, 3:57 PM

## 2015-10-05 NOTE — Progress Notes (Signed)
  Vascular Ultrasound Transcranial Doppler has been completed.    Landry Mellow, RDMS, RVT  10/05/2015, 3:20 PM

## 2015-10-05 NOTE — Evaluation (Signed)
Physical Therapy Evaluation Patient Details Name: Bobby Nguyen MRN: [id number] DOB: Jun 02, 1945 Today's Date: 10/05/2015   History of Present Illness  pt is a 70 y/o male with h/o HTN, gout admitted with L sided weakness, numbness and facial droop.  MRI + for R MCA CVA.  Clinical Impression  Pt is at or close to baseline functioning and should be safe at home with available assist . There are no further acute PT needs.  Will sign off at this time.     Follow Up Recommendations No PT follow up    Equipment Recommendations  None recommended by PT    Recommendations for Other Services       Precautions / Restrictions Precautions Precautions: None      Mobility  Bed Mobility Overal bed mobility: Independent                Transfers Overall transfer level: Independent                  Ambulation/Gait Ambulation/Gait assistance: Independent Ambulation Distance (Feet): 600 Feet Assistive device: None Gait Pattern/deviations: Step-through pattern   Gait velocity interpretation: >2.62 ft/sec, indicative of independent community ambulator General Gait Details: steady and fluid even with balance challenge  see DGI  Stairs Stairs: Yes Stairs assistance: Modified independent (Device/Increase time) Stair Management: One rail Right;Alternating pattern;Forwards Number of Stairs: 4 General stair comments: steady and safe  Wheelchair Mobility    Modified Rankin (Stroke Patients Only) Modified Rankin (Stroke Patients Only) Pre-Morbid Rankin Score: No symptoms Modified Rankin: Slight disability     Balance Overall balance assessment: Modified Independent                               Standardized Balance Assessment Standardized Balance Assessment : Dynamic Gait Index   Dynamic Gait Index Level Surface: Normal Change in Gait Speed: Normal Gait with Horizontal Head Turns: Normal Gait with Vertical Head Turns: Normal Gait and Pivot Turn:  Normal Step Over Obstacle: Normal Step Around Obstacles: Normal Steps: Mild Impairment Total Score: 23       Pertinent Vitals/Pain Pain Assessment: No/denies pain    Home Living Family/patient expects to be discharged to:: Private residence Living Arrangements: Spouse/significant other Available Help at Discharge: Family;Available 24 hours/day Type of Home: House Home Access: Stairs to enter Entrance Stairs-Rails: Psychiatric nurse of Steps: several Home Layout: Multi-level Home Equipment: Environmental consultant - 2 wheels;Cane - single point;Bedside commode;Shower seat;Wheelchair - manual      Prior Function Level of Independence: Independent         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 Extremity Assessment: Defer to OT evaluation           Lower Extremity Assessment: Overall WFL for tasks assessed         Communication   Communication: No difficulties;HOH  Cognition Arousal/Alertness: Awake/alert Behavior During Therapy: WFL for tasks assessed/performed Overall Cognitive Status: Within Functional Limits for tasks assessed                      General Comments      Exercises        Assessment/Plan    PT Assessment Patent does not need any further PT services  PT Diagnosis     PT Problem List    PT Treatment Interventions  PT Goals (Current goals can be found in the Care Plan section) Acute Rehab PT Goals PT Goal Formulation: All assessment and education complete, DC therapy    Frequency     Barriers to discharge        Co-evaluation               End of Session   Activity Tolerance: Patient tolerated treatment well Patient left: in bed;with call bell/phone within reach;with family/visitor present Nurse Communication: Mobility status         Time: [id number] PT Time Calculation (min) (ACUTE ONLY): 21 min   Charges:   PT Evaluation $PT Eval Moderate  Complexity: 1 Procedure     PT G Codes:        Arvie Bartholomew, Tessie Fass 10/05/2015, 5:34 PM 10/05/2015  Donnella Sham, PT [phone]  (pager)

## 2015-10-05 NOTE — Progress Notes (Signed)
  Echocardiogram 2D Echocardiogram has been performed.  Diamond Nickel 10/05/2015, 10:57 AM

## 2015-10-05 NOTE — Progress Notes (Signed)
Family Medicine Teaching Service Daily Progress Note Intern Pager: [phone]  Patient name: Bobby Nguyen Medical record number: [id number] Date of birth: [birth year] Age: 70 y.o. Gender: male  Primary Care Provider: No primary care provider on file. Consultants: Neurology Code Status: Full code  Pt Overview and Major Events to Date:  Head CT, Neurology consult, Xray,  Assessment and Plan: SHOTA DUREL is a 70 y.o. male presenting with Right MCA Infarct. PMH is significant for hypertension, hyperlipidemia, tobacco abuse, and gout.   # CVA: This morning patient symptoms continue to improve, increase strength on the left side upper and lower extremity. --Cardiac Monitoring -- F/u on A1C,  -- F/u on echocardiogram -- F/u on Carotid Dopplers -- Repeat CT in 24hr. MRI contraindicated given hardware from spinal fusion. -- Neurology consulted in ED. Appreciate recommendations. -- Aspirin -- Physical Therapy/Occupational Therapy/SLP  # Hypertension: Home medications include Atenolol 50mg  and Lisinopril 40mg . -- Hold antihypertensives to allow for permissive hypertension  # Elevated Creatinine: Creatinine 1.96 at admission. This morning creatinine is 2.0.  -- Continue NS@125cc /hr x12hr -- Holding home Lisinopril.  # Hyperlipidemia: -- Not currently on statin. Consider initiating during hospitalization. -- F/u on lipid Panel  # Chronic Pain: Home medications include Morphine 15mg  q12hr, Percocet 5-325mg  q8hr as needed. -- Continue home medications.  # Tobacco Abuse: Smokes 2ppd -- Nicotine Patch  # Alcohol Use: Reports 3-4 beers or more daily -- CIWA 4 overnight, 0 this am  FEN/GI: Heart Healthy, NS@125cc /hr x12hr Prophylaxis: Heparin  Disposition: Admit to FMTS  Subjective:  Patient doing better this morning, slowly regaining his strength on the left side, speech has improved as well. Numbness still present in left arm but improving.  Objective: Temp:  [97.9 F (36.6  C)-98.9 F (37.2 C)] 98.1 F (36.7 C) (07/10 0630) Pulse Rate:  [47-65] 58 (07/10 0630) Resp:  [11-18] 17 (07/10 0630) BP: (111-156)/(49-71) 112/53 mmHg (07/10 0630) SpO2:  [94 %-100 %] 99 % (07/10 0630) Weight:  [169 lb (76.658 kg)-169 lb 1.5 oz (76.7 kg)] 169 lb 1.5 oz (76.7 kg) (07/09 2028) Physical Exam: General: 70yo male resting comfortably in no apparent distress Eyes: PERRLA, White Sclera, EOM intact ENTM: Moist mucous membranes Neck: Stiff secondary to fusion Cardiovascular: S1 and S2 noted, no murmurs, regular rate and rhythm Respiratory: Bibasilar crackles noted, No increased work of breathing Abdomen: Soft and nondistended, nontender MSK: No edema noted, Muscle strength 5/5 in right upper and lower extremity and 4/5 in left upper and lower extremity. Skin: No rashes noted. Warm. Neuro: Slightly decreased but improving sensation over left upper and lower extremities, CN II-XII normal except for V (decreased facial sensation on left), Slurred and Delayed Speech. Psych: Alert, Oriented x3  Laboratory:  Recent Labs Lab 10/04/15 1840 10/04/15 1850  WBC 9.6  --   HGB 13.8 14.6  HCT 41.4 43.0  PLT 213  --     Recent Labs Lab 10/04/15 1840 10/04/15 1850  NA 137 139  K 4.2 4.2  CL 105 104  CO2 24  --   BUN 36* 38*  CREATININE 1.96* 2.00*  CALCIUM 9.4  --   PROT 6.7  --   BILITOT 0.6  --   ALKPHOS 41  --   ALT 8*  --   AST 20  --   GLUCOSE 92 87     Imaging/Diagnostic Tests:  The heart size and mediastinal contours are within normal limits. Stable parenchymal scarring bilaterally. There is no evidence of pulmonary edema,  consolidation, pneumothorax, nodule or pleural fluid. Stable appearance of old left-sided rib fractures  Marjie Skiff, MD 10/05/2015, 9:12 AM PGY-1, [facility] Intern pager: [phone], text pages welcome

## 2015-10-05 NOTE — Progress Notes (Signed)
OT / PT Cancellation Note  Patient Details Name: Bobby Nguyen MRN: [id number] DOB: 1945-11-23   Cancelled Treatment:    Reason Eval/Treat Not Completed: Patient at procedure or test/ unavailable (Echo)   Vonita Moss   OTR/L Pager: [phone] Office: [phone] .  10/05/2015, 11:05 AM

## 2015-10-05 NOTE — Consult Note (Addendum)
ELECTROPHYSIOLOGY CONSULT NOTE  Patient ID: Bobby Nguyen MRN: [id number], DOB/AGE: [birth year]   Admit date: 10/04/2015 Date of Consult: 10/06/2015  Primary Physician: No primary care provider on file. Primary Cardiologist: new to [facility] Reason for Consultation: Cryptogenic stroke; recommendations regarding Implantable Loop Recorder  History of Present Illness Bobby Nguyen was admitted on 10/04/2015 with left sided numbness. He was asymptomatic prior to going to bed the night before.  In addition to numbness, he had some slurring of speech and ataxia.  Imaging demonstrated non dominant right MCA infarct felt to be embolic secondary to unknown source.  He has undergone workup for stroke including echocardiogram and carotid dopplers.  The patient has been monitored on telemetry which has demonstrated sinus rhythm with no arrhythmias.  Inpatient stroke work-up is to be completed with a TEE.   Echocardiogram this admission demonstrated EF 55-60%, no RWMA.  Lab work is reviewed.  Prior to admission, the patient denies chest pain, shortness of breath, dizziness, palpitations, or syncope.  They are recovering from their stroke with plans to return home at discharge.  EP has been asked to evaluate for placement of an implantable loop recorder to monitor for atrial fibrillation.  Past Medical History  Diagnosis Date  . Back pain   . Hypertension   . Gout   . Hyperlipidemia      Surgical History:  Past Surgical History  Procedure Laterality Date  . Arm surgery       Prescriptions prior to admission  Medication Sig Dispense Refill Last Dose  . allopurinol (ZYLOPRIM) 300 MG tablet Take 300 mg by mouth daily.   10/04/2015 at Unknown time  . atenolol (TENORMIN) 50 MG tablet Take 50 mg by mouth daily.   10/04/2015 at Unknown time  . fenofibrate 160 MG tablet Take 160 mg by mouth daily.   10/04/2015 at Unknown time  . ibuprofen (ADVIL,MOTRIN) 200 MG tablet Take 400 mg by mouth every 6 (six)  hours as needed for moderate pain.   10/04/2015 at Unknown time  . lisinopril (PRINIVIL,ZESTRIL) 40 MG tablet Take 40 mg by mouth daily.   10/04/2015 at Unknown time  . morphine (MS CONTIN) 15 MG 12 hr tablet Take 15 mg by mouth 2 (two) times daily.  0 10/04/2015 at 0830  . oxyCODONE-acetaminophen (PERCOCET/ROXICET) 5-325 MG tablet Take 1 tablet by mouth every 8 (eight) hours as needed for severe pain.   Past Month at Unknown time    Inpatient Medications:  . aspirin  300 mg Rectal Daily   Or  . aspirin  325 mg Oral Daily  . folic acid  1 mg Oral Daily  . heparin  5,000 Units Subcutaneous Q8H  . morphine  15 mg Oral BID  . multivitamin with minerals  1 tablet Oral Daily  . nicotine  21 mg Transdermal Daily  . simvastatin  20 mg Oral q1800  . thiamine  100 mg Oral Daily    Allergies:  Allergies  Allergen Reactions  . Penicillins Itching, Swelling and Rash    Social History   Social History  . Marital Status: Married    Spouse Name: N/A  . Number of Children: N/A  . Years of Education: N/A   Occupational History  . Not on file.   Social History Main Topics  . Smoking status: Current Every Day Smoker    Types: Cigarettes  . Smokeless tobacco: Not on file  . Alcohol Use: 2.4 oz/week    4 Cans of beer  per week  . Drug Use: No  . Sexual Activity: Not on file   Other Topics Concern  . Not on file   Social History Narrative     Family History: No premature CAD    Review of Systems: All other systems reviewed and are otherwise negative except as noted above.  Physical Exam: Filed Vitals:   10/05/15 1820 10/05/15 2029 10/06/15 0031 10/06/15 0428  BP: 148/58 145/55 142/58 139/56  Pulse: 65 67 63 69  Temp: 98.7 F (37.1 C) 98.7 F (37.1 C) 98.5 F (36.9 C) 98.2 F (36.8 C)  TempSrc: Oral Oral Oral Oral  Resp: 18 20 18 16   Height:      Weight:      SpO2: 96% 98% 98% 97%    GEN- The patient is elderly appearing, alert and oriented x 3 today, hard of hearing    Head- normocephalic, atraumatic Eyes-  Sclera clear, conjunctiva pink Ears- hearing intact Oropharynx- clear Neck- supple Lungs- Clear to ausculation bilaterally, normal work of breathing Heart- Bradycardic regular rate and rhythm  GI- soft, NT, ND, + BS Extremities- no clubbing, cyanosis, or edema MS- no significant deformity or atrophy Skin- no rash or lesion Psych- euthymic mood, full affect   Labs:   Lab Results  Component Value Date   WBC 9.6 10/04/2015   HGB 14.6 10/04/2015   HCT 43.0 10/04/2015   MCV 97.0 10/04/2015   PLT 213 10/04/2015    Recent Labs Lab 10/04/15 1840  10/05/15 1001  NA 137  < > 138  K 4.2  < > 4.0  CL 105  < > 106  CO2 24  --  26  BUN 36*  < > 28*  CREATININE 1.96*  < > 1.62*  CALCIUM 9.4  --  9.3  PROT 6.7  --   --   BILITOT 0.6  --   --   ALKPHOS 41  --   --   ALT 8*  --   --   AST 20  --   --   GLUCOSE 92  < > 96  < > = values in this interval not displayed.   Radiology/Studies: Dg Chest 2 View 10/05/2015  CLINICAL DATA:  Cerebral infarction. EXAM: CHEST  2 VIEW COMPARISON:  11/25/2009 FINDINGS: The heart size and mediastinal contours are within normal limits. Stable parenchymal scarring bilaterally. There is no evidence of pulmonary edema, consolidation, pneumothorax, nodule or pleural fluid. Stable appearance of old left-sided rib fractures. IMPRESSION: No active cardiopulmonary disease. Electronically Signed   By: Aletta Edouard M.D.   On: 10/05/2015 07:52   Ct Head Code Stroke W/o Cm 10/04/2015  CLINICAL DATA:  Left-sided facial droop and dysarthria. Left arm numbness. Symptoms began 20 hours ago approximately. Worsening of symptoms during the day. EXAM: CT HEAD WITHOUT CONTRAST TECHNIQUE: Contiguous axial images were obtained from the base of the skull through the vertex without intravenous contrast. COMPARISON:  06/19/2007 FINDINGS: The brainstem and cerebellum are normal. The left cerebral hemispheres normal except for mild chronic  small-vessel change of the deep white matter. Right hemisphere shows low density at the frontoparietal junction region measuring about 3-4 cm in size consistent with late acute infarction. Basal ganglia are spared. Insular brain is spared. No evidence of mass effect or hemorrhage. No hydrocephalus or extra-axial collection. I would consider this to a ball the M2, M3 and M 6 regions in therefore would assigned this aspects 7. IMPRESSION: Late acute infarction in the right MCA territory  affecting M2, M3 and M6 regions consistent with aspects score 7. No mass effect or hemorrhage. Electronically Signed   By: Nelson Chimes M.D.   On: 10/04/2015 18:59    12-lead ECG sinus rhythm, rate 65 No other EKG's in EPIC  Telemetry sinus bradycardia   Assessment and Plan:  1. Cryptogenic stroke The patient presents with cryptogenic stroke.  The patient has a TEE planned for this AM.  I spoke at length with the patient about monitoring for afib with an implantable loop recorder.  Risks, benefits, and alteratives to implantable loop recorder were discussed with the patient today.   At this time, the patient is very clear in their decision to proceed with implantable loop recorder.   Wound care was reviewed with the patient (keep incision clean and dry for 3 days).  Wound check scheduled for 10/21/15 at 11AM   2.  Asymptomatic sinus bradycardia No dizziness, pre-syncope, or syncope No further work up needed  Please call with questions.   Chanetta Marshall, NP 10/06/2015 7:23 AM  I have seen, examined the patient, and reviewed the above assessment and plan.  On exam, brady regular rhythm.  Changes to above are made where necessary.  Will proceed with ILR implant if TEE is unrevealing later today.  Co Sign: Thompson Grayer, MD 10/06/2015 8:04 AM  Addendum: Difficulty with TEE by Dr Radford Pax noted. TEE rescheduled for tomorrow with Anesthesia. Will therefore place implantable loop recorder on hold until after TEE is  performed.  Thompson Grayer MD, [facility] 10/06/2015 10:57 AM  After further discussion with Dr Leonie Man, plan is to forego TEE due to inherent risks and patients wish to decline.   Will proceed with LINQ per Dr Leonie Man and patient's discussions.  Thompson Grayer MD, [facility] 10/06/2015 2:48 PM

## 2015-10-05 NOTE — Progress Notes (Signed)
Patient has been asymptomatic bradycardic throughout night.  HR runs between low 40s - high 50s.  Vital signs have remained stable.  Patient denies any distress, discomfort, or disturbance.  Dr. Erin Hearing in to round on patient and informed of patient status.

## 2015-10-06 ENCOUNTER — Inpatient Hospital Stay (HOSPITAL_COMMUNITY): Payer: Medicare Other

## 2015-10-06 ENCOUNTER — Encounter (HOSPITAL_COMMUNITY)
Admission: EM | Disposition: A | Discharge status: Home / Self Care | Payer: Self-pay | Source: Home / Self Care | Attending: Family Medicine

## 2015-10-06 ENCOUNTER — Encounter (HOSPITAL_COMMUNITY): Payer: Self-pay | Admitting: Radiology

## 2015-10-06 DIAGNOSIS — R001 Bradycardia, unspecified: Secondary | ICD-10-CM

## 2015-10-06 DIAGNOSIS — I639 Cerebral infarction, unspecified: Secondary | ICD-10-CM

## 2015-10-06 HISTORY — PX: EP IMPLANTABLE DEVICE: SHX172B

## 2015-10-06 LAB — BASIC METABOLIC PANEL
ANION GAP: 5 (ref 5–15)
BUN: 23 mg/dL — AB (ref 6–20)
CALCIUM: 9.2 mg/dL (ref 8.9–10.3)
CO2: 26 mmol/L (ref 22–32)
Chloride: 106 mmol/L (ref 101–111)
Creatinine, Ser: 1.39 mg/dL — ABNORMAL HIGH (ref 0.61–1.24)
GFR calc Af Amer: 58 mL/min — ABNORMAL LOW (ref >60)
GFR, EST NON AFRICAN AMERICAN: 50 mL/min — AB (ref >60)
GLUCOSE: 99 mg/dL (ref 65–99)
POTASSIUM: 4.3 mmol/L (ref 3.5–5.1)
SODIUM: 137 mmol/L (ref 135–145)

## 2015-10-06 LAB — HEMOGLOBIN A1C
Hgb A1c MFr Bld: 5.4 % (ref 4.8–5.6)
Mean Plasma Glucose: 108 mg/dL

## 2015-10-06 SURGERY — LOOP RECORDER INSERTION

## 2015-10-06 SURGERY — CANCELLED PROCEDURE

## 2015-10-06 MED ORDER — FENTANYL CITRATE (PF) 100 MCG/2ML IJ SOLN
INTRAMUSCULAR | Status: AC
  Filled 2015-10-06: qty 2

## 2015-10-06 MED ORDER — LIDOCAINE HCL (PF) 1 % IJ SOLN
INTRAMUSCULAR | Status: DC | PRN
  Administered 2015-10-06: 5 mL via INTRADERMAL

## 2015-10-06 MED ORDER — LIDOCAINE-EPINEPHRINE 1 %-1:100000 IJ SOLN
INTRAMUSCULAR | Status: AC
  Filled 2015-10-06: qty 1

## 2015-10-06 MED ORDER — MIDAZOLAM HCL 10 MG/2ML IJ SOLN
INTRAMUSCULAR | Status: DC | PRN
  Administered 2015-10-06 (×3): 1 mg via INTRAVENOUS
  Administered 2015-10-06: 2 mg via INTRAVENOUS

## 2015-10-06 MED ORDER — BUTAMBEN-TETRACAINE-BENZOCAINE 2-2-14 % EX AERO
INHALATION_SPRAY | CUTANEOUS | Status: DC | PRN
  Administered 2015-10-06: 2 via TOPICAL

## 2015-10-06 MED ORDER — ASPIRIN 325 MG PO TABS: 325.0000 mg | ORAL_TABLET | Freq: Every day | ORAL | Status: DC

## 2015-10-06 MED ORDER — DIPHENHYDRAMINE HCL 50 MG/ML IJ SOLN
INTRAMUSCULAR | Status: DC | PRN
  Administered 2015-10-06 (×2): 12.5 mg via INTRAVENOUS

## 2015-10-06 MED ORDER — MIDAZOLAM HCL 5 MG/ML IJ SOLN
INTRAMUSCULAR | Status: AC
  Filled 2015-10-06: qty 2

## 2015-10-06 MED ORDER — DIPHENHYDRAMINE HCL 50 MG/ML IJ SOLN
INTRAMUSCULAR | Status: AC
  Filled 2015-10-06: qty 1

## 2015-10-06 MED ORDER — FENTANYL CITRATE (PF) 100 MCG/2ML IJ SOLN
INTRAMUSCULAR | Status: DC | PRN
  Administered 2015-10-06 (×3): 12.5 ug via INTRAVENOUS
  Administered 2015-10-06: 25 ug via INTRAVENOUS

## 2015-10-06 MED ORDER — IOPAMIDOL (ISOVUE-370) INJECTION 76%
INTRAVENOUS | Status: AC
  Administered 2015-10-06: 50 mL
  Filled 2015-10-06: qty 50

## 2015-10-06 SURGICAL SUPPLY — 2 items
LOOP REVEAL LINQSYS (Prosthesis & Implant Heart) ×2 IMPLANT
PACK LOOP INSERTION (CUSTOM PROCEDURE TRAY) ×3 IMPLANT

## 2015-10-06 NOTE — Evaluation (Signed)
Speech Language Pathology Evaluation Patient Details Name: AZARIEL PAYNTER MRN: [id number] DOB: [birth year] Today's Date: 10/06/2015 Time: 1200-1212 SLP Time Calculation (min) (ACUTE ONLY): 12 min  Problem List:  Patient Active Problem List   Diagnosis Date Noted  . Acute CVA (cerebrovascular accident) ([place]) 10/05/2015  . Acute ischemic right middle cerebral artery (MCA) stroke ([place]) 10/04/2015  . TIA (transient ischemic attack) 10/04/2015   Past Medical History:  Past Medical History  Diagnosis Date  . Back pain   . Hypertension   . Gout   . Hyperlipidemia    Past Surgical History:  Past Surgical History  Procedure Laterality Date  . Arm surgery     HPI:  JOANN SARSOUR is a 70 y.o. male presenting with Right MCA Infarct. PMH is significant for hypertension, hyperlipidemia, tobacco abuse, and gout. CT Late acute infarction in the right MCA territory .   Assessment / Plan / Recommendation Clinical Impression  Pt demonstrated mild difficulty recalling reason for admission, plan of care however he had Versed for attempted TEE this morning. Min prompt for verbal problem solving. Pt's daughter and wife present and denied cognitive differences with CVA. Wife prepares pt's pill box weekly and handles finances and both recall doctor's appointments. No ST recommended at present. Educated pt and family to outpatient ST if pt exhibits difficulty once back to normal activities.     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)   Cognition  Overall Cognitive Status: Within Functional Limits for tasks assessed Arousal/Alertness: Awake/alert Orientation Level: Oriented X4 Attention: Sustained Sustained  Attention: Appears intact Memory: Impaired Memory Impairment: Decreased recall of new information;Storage deficit Awareness: Appears intact Problem Solving: Appears intact Safety/Judgment: Appears intact    Comprehension  Auditory Comprehension Overall Auditory Comprehension: Appears within functional limits for tasks assessed Visual Recognition/Discrimination Discrimination: Not tested Reading Comprehension Reading Status: Not tested    Expression Expression Primary Mode of Expression: Verbal Verbal Expression Overall Verbal Expression: Appears within functional limits for tasks assessed Initiation: No impairment Level of Generative/Spontaneous Verbalization: Conversation Repetition:  (NT) Naming: No impairment Pragmatics: No impairment Written Expression Written Expression: Not tested   Oral / Motor  Oral Motor/Sensory Function Overall Oral Motor/Sensory Function: Within functional limits Motor Speech Overall Motor Speech: Appears within functional limits for tasks assessed Intelligibility: Intelligible Motor Planning: Witnin functional limits   GO                    Houston Siren 10/06/2015, 1:20 PM  Orbie Pyo Hamad Whyte M.Ed [employer] [phone]

## 2015-10-06 NOTE — Progress Notes (Signed)
OT Cancellation Note  Patient Details Name: Bobby Nguyen MRN: [id number] DOB: March 18, 1946   Cancelled Treatment:    Reason Eval/Treat Not Completed: Patient at procedure or test/ unavailable -- Patient currently off unit for procedure; will check back as time permits for OT evaluation.  Laylee Schooley A 10/06/2015, 8:27 AM

## 2015-10-06 NOTE — CV Procedure (Signed)
    PROCEDURE NOTE:  Procedure:  Transesophageal echocardiogram Operator:  Fransico Him, MD Indications:  CVA Complications: None  During this procedure the patient is administered a total of Versed 5 mg, Benadryl 25mg  and Fentanyl 62.5 mg to achieve and maintain moderate conscious sedation.  The patient's heart rate, blood pressure, and oxygen saturation are monitored continuously during the procedure. The period of conscious sedation is 20 minutes, of which I was present face-to-face 100% of this time.  Adequate sedation was obtained and patient snoring and not arousable to being spoken to.  Multiple attempts were made at passing TEE probe but patient would become agitated immediately upon intubation pulling at the tube and combative.  Case was cancelled and will be done tomorrow under anesthesia.  The patient  was transferred back to their room in stable condition.  Signed: Fransico Him, MD [facility]

## 2015-10-06 NOTE — Progress Notes (Signed)
During TEE procedure, pt given adequate sedation, arousable only to voice, but would become wide awake and combative during multiple attempts to pass TEE probe.  Procedure aborted, will be rescheduled with anesthesia later this week.  Family notified.  Vista Lawman, RN

## 2015-10-06 NOTE — Progress Notes (Signed)
Patient returned from ENDO. VSS. Stroke team at bedside with plan for pt to have outpatient TEE.   Ave Filter, RN

## 2015-10-06 NOTE — Interval H&P Note (Signed)
History and Physical Interval Note:  10/06/2015 9:13 AM  Bobby Nguyen  has presented today for surgery, with the diagnosis of STOKE  The various methods of treatment have been discussed with the patient and family. After consideration of risks, benefits and other options for treatment, the patient has consented to  Procedure(s): TRANSESOPHAGEAL ECHOCARDIOGRAM (TEE) (N/A) as a surgical intervention .  The patient's history has been reviewed, patient examined, no change in status, stable for surgery.  I have reviewed the patient's chart and labs.  Questions were answered to the patient's satisfaction.     Fransico Him

## 2015-10-06 NOTE — H&P (View-Only) (Signed)
ELECTROPHYSIOLOGY CONSULT NOTE  Patient ID: Bobby Nguyen MRN: [id number], DOB/AGE: Feb 21, 1946   Admit date: 10/04/2015 Date of Consult: 10/06/2015  Primary Physician: No primary care provider on file. Primary Cardiologist: new to [place] Reason for Consultation: Cryptogenic stroke; recommendations regarding Implantable Loop Recorder  History of Present Illness Bobby Nguyen was admitted on 10/04/2015 with left sided numbness. He was asymptomatic prior to going to bed the night before.  In addition to numbness, he had some slurring of speech and ataxia.  Imaging demonstrated non dominant right MCA infarct felt to be embolic secondary to unknown source.  He has undergone workup for stroke including echocardiogram and carotid dopplers.  The patient has been monitored on telemetry which has demonstrated sinus rhythm with no arrhythmias.  Inpatient stroke work-up is to be completed with a TEE.   Echocardiogram this admission demonstrated EF 55-60%, no RWMA.  Lab work is reviewed.  Prior to admission, the patient denies chest pain, shortness of breath, dizziness, palpitations, or syncope.  They are recovering from their stroke with plans to return home at discharge.  EP has been asked to evaluate for placement of an implantable loop recorder to monitor for atrial fibrillation.  Past Medical History  Diagnosis Date  . Back pain   . Hypertension   . Gout   . Hyperlipidemia      Surgical History:  Past Surgical History  Procedure Laterality Date  . Arm surgery       Prescriptions prior to admission  Medication Sig Dispense Refill Last Dose  . allopurinol (ZYLOPRIM) 300 MG tablet Take 300 mg by mouth daily.   10/04/2015 at Unknown time  . atenolol (TENORMIN) 50 MG tablet Take 50 mg by mouth daily.   10/04/2015 at Unknown time  . fenofibrate 160 MG tablet Take 160 mg by mouth daily.   10/04/2015 at Unknown time  . ibuprofen (ADVIL,MOTRIN) 200 MG tablet Take 400 mg by mouth every 6 (six)  hours as needed for moderate pain.   10/04/2015 at Unknown time  . lisinopril (PRINIVIL,ZESTRIL) 40 MG tablet Take 40 mg by mouth daily.   10/04/2015 at Unknown time  . morphine (MS CONTIN) 15 MG 12 hr tablet Take 15 mg by mouth 2 (two) times daily.  0 10/04/2015 at 0830  . oxyCODONE-acetaminophen (PERCOCET/ROXICET) 5-325 MG tablet Take 1 tablet by mouth every 8 (eight) hours as needed for severe pain.   Past Month at Unknown time    Inpatient Medications:  . aspirin  300 mg Rectal Daily   Or  . aspirin  325 mg Oral Daily  . folic acid  1 mg Oral Daily  . heparin  5,000 Units Subcutaneous Q8H  . morphine  15 mg Oral BID  . multivitamin with minerals  1 tablet Oral Daily  . nicotine  21 mg Transdermal Daily  . simvastatin  20 mg Oral q1800  . thiamine  100 mg Oral Daily    Allergies:  Allergies  Allergen Reactions  . Penicillins Itching, Swelling and Rash    Social History   Social History  . Marital Status: Married    Spouse Name: N/A  . Number of Children: N/A  . Years of Education: N/A   Occupational History  . Not on file.   Social History Main Topics  . Smoking status: Current Every Day Smoker    Types: Cigarettes  . Smokeless tobacco: Not on file  . Alcohol Use: 2.4 oz/week    4 Cans of beer  per week  . Drug Use: No  . Sexual Activity: Not on file   Other Topics Concern  . Not on file   Social History Narrative     Family History: No premature CAD    Review of Systems: All other systems reviewed and are otherwise negative except as noted above.  Physical Exam: Filed Vitals:   10/05/15 1820 10/05/15 2029 10/06/15 0031 10/06/15 0428  BP: 148/58 145/55 142/58 139/56  Pulse: 65 67 63 69  Temp: 98.7 F (37.1 C) 98.7 F (37.1 C) 98.5 F (36.9 C) 98.2 F (36.8 C)  TempSrc: Oral Oral Oral Oral  Resp: 18 20 18 16   Height:      Weight:      SpO2: 96% 98% 98% 97%    GEN- The patient is elderly appearing, alert and oriented x 3 today, hard of hearing    Head- normocephalic, atraumatic Eyes-  Sclera clear, conjunctiva pink Ears- hearing intact Oropharynx- clear Neck- supple Lungs- Clear to ausculation bilaterally, normal work of breathing Heart- Bradycardic regular rate and rhythm  GI- soft, NT, ND, + BS Extremities- no clubbing, cyanosis, or edema MS- no significant deformity or atrophy Skin- no rash or lesion Psych- euthymic mood, full affect   Labs:   Lab Results  Component Value Date   WBC 9.6 10/04/2015   HGB 14.6 10/04/2015   HCT 43.0 10/04/2015   MCV 97.0 10/04/2015   PLT 213 10/04/2015    Recent Labs Lab 10/04/15 1840  10/05/15 1001  NA 137  < > 138  K 4.2  < > 4.0  CL 105  < > 106  CO2 24  --  26  BUN 36*  < > 28*  CREATININE 1.96*  < > 1.62*  CALCIUM 9.4  --  9.3  PROT 6.7  --   --   BILITOT 0.6  --   --   ALKPHOS 41  --   --   ALT 8*  --   --   AST 20  --   --   GLUCOSE 92  < > 96  < > = values in this interval not displayed.   Radiology/Studies: Dg Chest 2 View 10/05/2015  CLINICAL DATA:  Cerebral infarction. EXAM: CHEST  2 VIEW COMPARISON:  11/25/2009 FINDINGS: The heart size and mediastinal contours are within normal limits. Stable parenchymal scarring bilaterally. There is no evidence of pulmonary edema, consolidation, pneumothorax, nodule or pleural fluid. Stable appearance of old left-sided rib fractures. IMPRESSION: No active cardiopulmonary disease. Electronically Signed   By: Aletta Edouard M.D.   On: 10/05/2015 07:52   Ct Head Code Stroke W/o Cm 10/04/2015  CLINICAL DATA:  Left-sided facial droop and dysarthria. Left arm numbness. Symptoms began 20 hours ago approximately. Worsening of symptoms during the day. EXAM: CT HEAD WITHOUT CONTRAST TECHNIQUE: Contiguous axial images were obtained from the base of the skull through the vertex without intravenous contrast. COMPARISON:  06/19/2007 FINDINGS: The brainstem and cerebellum are normal. The left cerebral hemispheres normal except for mild chronic  small-vessel change of the deep white matter. Right hemisphere shows low density at the frontoparietal junction region measuring about 3-4 cm in size consistent with late acute infarction. Basal ganglia are spared. Insular brain is spared. No evidence of mass effect or hemorrhage. No hydrocephalus or extra-axial collection. I would consider this to a ball the M2, M3 and M 6 regions in therefore would assigned this aspects 7. IMPRESSION: Late acute infarction in the right MCA territory  affecting M2, M3 and M6 regions consistent with aspects score 7. No mass effect or hemorrhage. Electronically Signed   By: Nelson Chimes M.D.   On: 10/04/2015 18:59    12-lead ECG sinus rhythm, rate 65 No other EKG's in EPIC  Telemetry sinus bradycardia   Assessment and Plan:  1. Cryptogenic stroke The patient presents with cryptogenic stroke.  The patient has a TEE planned for this AM.  I spoke at length with the patient about monitoring for afib with an implantable loop recorder.  Risks, benefits, and alteratives to implantable loop recorder were discussed with the patient today.   At this time, the patient is very clear in their decision to proceed with implantable loop recorder.   Wound care was reviewed with the patient (keep incision clean and dry for 3 days).  Wound check scheduled for 10/21/15 at 11AM   2.  Asymptomatic sinus bradycardia No dizziness, pre-syncope, or syncope No further work up needed  Please call with questions.   Chanetta Marshall, NP 10/06/2015 7:23 AM  I have seen, examined the patient, and reviewed the above assessment and plan.  On exam, brady regular rhythm.  Changes to above are made where necessary.  Will proceed with ILR implant if TEE is unrevealing later today.  Co Sign: Thompson Grayer, MD 10/06/2015 8:04 AM

## 2015-10-06 NOTE — Interval H&P Note (Signed)
History and Physical Interval Note:  10/06/2015 8:05 AM  Bobby Nguyen  has presented today for surgery, with the diagnosis of afib  The various methods of treatment have been discussed with the patient and family. After consideration of risks, benefits and other options for treatment, the patient has consented to  Procedure(s): Loop Recorder Insertion (N/A) as a surgical intervention .  The patient's history has been reviewed, patient examined, no change in status, stable for surgery.  I have reviewed the patient's chart and labs.  Questions were answered to the patient's satisfaction.     Thompson Grayer

## 2015-10-06 NOTE — Progress Notes (Signed)
STROKE TEAM PROGRESS NOTE   SUBJECTIVE (INTERVAL HISTORY) His wife and daughter are at the bedside. He is just back from attempted TEE - the could not do - plan to reattempt under GA on Thurs. Patient currently upset "that was a damn waste of time". Can consider discharge and doing as an OP on Thurs. Dr. Leonie Man to discuss with attending.    OBJECTIVE Temp:  [97.5 F (36.4 C)-98.7 F (37.1 C)] 98.4 F (36.9 C) (07/11 0825) Pulse Rate:  [49-72] 56 (07/11 0952) Cardiac Rhythm:  [-] Sinus bradycardia (07/11 0945) Resp:  [10-24] 13 (07/11 0952) BP: (121-181)/(51-72) 154/51 mmHg (07/11 0952) SpO2:  [95 %-100 %] 99 % (07/11 0952)  CBC:   Recent Labs Lab 10/04/15 1840 10/04/15 1850  WBC 9.6  --   NEUTROABS 5.3  --   HGB 13.8 14.6  HCT 41.4 43.0  MCV 97.0  --   PLT 213  --     Basic Metabolic Panel:   Recent Labs Lab 10/05/15 1001 10/06/15 0747  NA 138 137  K 4.0 4.3  CL 106 106  CO2 26 26  GLUCOSE 96 99  BUN 28* 23*  CREATININE 1.62* 1.39*  CALCIUM 9.3 9.2    Lipid Panel:     Component Value Date/Time   CHOL 156 10/05/2015 0658   TRIG 155* 10/05/2015 0658   HDL 41 10/05/2015 0658   CHOLHDL 3.8 10/05/2015 0658   VLDL 31 10/05/2015 0658   LDLCALC 84 10/05/2015 0658   HgbA1c:  Lab Results  Component Value Date   HGBA1C 5.4 10/05/2015   Urine Drug Screen: No results found for: LABOPIA, COCAINSCRNUR, LABBENZ, AMPHETMU, THCU, LABBARB    IMAGING  Ct Angio Head W Or Wo Contrast  10/06/2015  CLINICAL DATA:  Follow-up embolic infarct. EXAM: CT ANGIOGRAPHY HEAD AND NECK TECHNIQUE: Multidetector CT imaging of the head and neck was performed using the standard protocol during bolus administration of intravenous contrast. Multiplanar CT image reconstructions and MIPs were obtained to evaluate the vascular anatomy. Carotid stenosis measurements (when applicable) are obtained utilizing NASCET criteria, using the distal internal carotid diameter as the denominator. CONTRAST:   50 cc Isovue 370 history of hypertension hyperlipidemia. COMPARISON:  CT HEAD October 04, 2015 FINDINGS: CT HEAD INTRACRANIAL CONTENTS: Evolving RIGHT frontal parietal insular/operculum or wedge-like hypodensity without further propagation. No hemorrhagic conversion. Ventricles and sulci are overall normal for patient's age. No midline shift. No abnormal extra-axial fluid collections. Mild white matter changes most compatible with chronic small vessel ischemic disease are less than expected for age. ORBITS: The included ocular globes and orbital contents are non-suspicious. SINUSES: The mastoid aircells and included paranasal sinuses are well-aerated. SKULL/SOFT TISSUES: No skull fracture. No significant soft tissue swelling. CTA NECK AORTIC ARCH: Normal appearance of the thoracic arch, normal branch pattern. Mild calcific atherosclerosis aortic arch. The origins of the innominate, left Common carotid artery and subclavian artery are widely patent. RIGHT CAROTID SYSTEM: Common carotid artery is widely patent, coursing in a straight line fashion. Normal appearance of the carotid bifurcation without hemodynamically significant stenosis by NASCET criteria. Mild eccentric calcific atherosclerosis. Normal appearance of the included internal carotid artery. LEFT CAROTID SYSTEM: Common carotid artery is widely patent, coursing in a straight line fashion. Normal appearance of the carotid bifurcation without hemodynamically significant stenosis by NASCET criteria. Mild eccentric calcific atherosclerosis. Normal appearance of the included internal carotid artery. VERTEBRAL ARTERIES:RIGHT vertebral artery is dominant. Normal appearance of the vertebral arteries, which appear widely patent. SKELETON: No acute  osseous process though bone windows have not been submitted. Mild degenerative change of the cervical spine. Old T1 and T2 spinous process fractures with nonunion. OTHER NECK: Soft tissues of the neck are non-acute though,  not tailored for evaluation. Included apical heterogeneous lung attenuation most compatible with small airway disease associated bronchial wall thickening, centrilobular and paraseptal emphysema predominately on the RIGHT. CTA HEAD ANTERIOR CIRCULATION: Normal appearance of the cervical internal carotid arteries, petrous, cavernous and supra clinoid internal carotid arteries. Widely patent anterior communicating artery. Patent of the anterior and LEFT middle cerebral arteries. Distal RIGHT M2 occlusion, best seen on sagittal 83/250. No hemodynamically significant stenosis, dissection, contrast extravasation or aneurysm. Mild luminal irregularity of the middle and anterior cerebral arteries. POSTERIOR CIRCULATION: Normal appearance of the vertebral arteries, vertebrobasilar junction and basilar artery, as well as main branch vessels. Normal appearance of the posterior cerebral arteries. Robust RIGHT posterior communicating artery. No large vessel occlusion, hemodynamically significant stenosis, dissection, contrast extravasation or aneurysm. Mild luminal irregularity of posterior cerebral arteries. VENOUS SINUSES: Major dural venous sinuses are patent though not tailored for evaluation on this angiographic examination. ANATOMIC VARIANTS: None. DELAYED PHASE: No abnormal intracranial enhancement. IMPRESSION: CTA HEAD: Evolving small to moderate RIGHT MCA territory infarct without hemorrhagic conversion. Otherwise negative CT HEAD for age. CTA NECK: Atherosclerosis without hemodynamically significant stenosis or acute vascular process. CTA HEAD: Distal RIGHT M2 occlusion corresponding to known late acute MCA territory infarct. Mild intracranial atherosclerosis. Electronically Signed   By: Elon Alas M.D.   On: 10/06/2015 03:10   Dg Chest 2 View  10/05/2015  CLINICAL DATA:  Cerebral infarction. EXAM: CHEST  2 VIEW COMPARISON:  11/25/2009 FINDINGS: The heart size and mediastinal contours are within normal  limits. Stable parenchymal scarring bilaterally. There is no evidence of pulmonary edema, consolidation, pneumothorax, nodule or pleural fluid. Stable appearance of old left-sided rib fractures. IMPRESSION: No active cardiopulmonary disease. Electronically Signed   By: Aletta Edouard M.D.   On: 10/05/2015 07:52   Ct Angio Neck W Or Wo Contrast  10/06/2015  CLINICAL DATA:  Follow-up embolic infarct. EXAM: CT ANGIOGRAPHY HEAD AND NECK TECHNIQUE: Multidetector CT imaging of the head and neck was performed using the standard protocol during bolus administration of intravenous contrast. Multiplanar CT image reconstructions and MIPs were obtained to evaluate the vascular anatomy. Carotid stenosis measurements (when applicable) are obtained utilizing NASCET criteria, using the distal internal carotid diameter as the denominator. CONTRAST:  50 cc Isovue 370 history of hypertension hyperlipidemia. COMPARISON:  CT HEAD October 04, 2015 FINDINGS: CT HEAD INTRACRANIAL CONTENTS: Evolving RIGHT frontal parietal insular/operculum or wedge-like hypodensity without further propagation. No hemorrhagic conversion. Ventricles and sulci are overall normal for patient's age. No midline shift. No abnormal extra-axial fluid collections. Mild white matter changes most compatible with chronic small vessel ischemic disease are less than expected for age. ORBITS: The included ocular globes and orbital contents are non-suspicious. SINUSES: The mastoid aircells and included paranasal sinuses are well-aerated. SKULL/SOFT TISSUES: No skull fracture. No significant soft tissue swelling. CTA NECK AORTIC ARCH: Normal appearance of the thoracic arch, normal branch pattern. Mild calcific atherosclerosis aortic arch. The origins of the innominate, left Common carotid artery and subclavian artery are widely patent. RIGHT CAROTID SYSTEM: Common carotid artery is widely patent, coursing in a straight line fashion. Normal appearance of the carotid  bifurcation without hemodynamically significant stenosis by NASCET criteria. Mild eccentric calcific atherosclerosis. Normal appearance of the included internal carotid artery. LEFT CAROTID SYSTEM: Common carotid artery is widely  patent, coursing in a straight line fashion. Normal appearance of the carotid bifurcation without hemodynamically significant stenosis by NASCET criteria. Mild eccentric calcific atherosclerosis. Normal appearance of the included internal carotid artery. VERTEBRAL ARTERIES:RIGHT vertebral artery is dominant. Normal appearance of the vertebral arteries, which appear widely patent. SKELETON: No acute osseous process though bone windows have not been submitted. Mild degenerative change of the cervical spine. Old T1 and T2 spinous process fractures with nonunion. OTHER NECK: Soft tissues of the neck are non-acute though, not tailored for evaluation. Included apical heterogeneous lung attenuation most compatible with small airway disease associated bronchial wall thickening, centrilobular and paraseptal emphysema predominately on the RIGHT. CTA HEAD ANTERIOR CIRCULATION: Normal appearance of the cervical internal carotid arteries, petrous, cavernous and supra clinoid internal carotid arteries. Widely patent anterior communicating artery. Patent of the anterior and LEFT middle cerebral arteries. Distal RIGHT M2 occlusion, best seen on sagittal 83/250. No hemodynamically significant stenosis, dissection, contrast extravasation or aneurysm. Mild luminal irregularity of the middle and anterior cerebral arteries. POSTERIOR CIRCULATION: Normal appearance of the vertebral arteries, vertebrobasilar junction and basilar artery, as well as main branch vessels. Normal appearance of the posterior cerebral arteries. Robust RIGHT posterior communicating artery. No large vessel occlusion, hemodynamically significant stenosis, dissection, contrast extravasation or aneurysm. Mild luminal irregularity of  posterior cerebral arteries. VENOUS SINUSES: Major dural venous sinuses are patent though not tailored for evaluation on this angiographic examination. ANATOMIC VARIANTS: None. DELAYED PHASE: No abnormal intracranial enhancement. IMPRESSION: CTA HEAD: Evolving small to moderate RIGHT MCA territory infarct without hemorrhagic conversion. Otherwise negative CT HEAD for age. CTA NECK: Atherosclerosis without hemodynamically significant stenosis or acute vascular process. CTA HEAD: Distal RIGHT M2 occlusion corresponding to known late acute MCA territory infarct. Mild intracranial atherosclerosis. Electronically Signed   By: Elon Alas M.D.   On: 10/06/2015 03:10   Ct Head Code Stroke W/o Cm  10/04/2015  CLINICAL DATA:  Left-sided facial droop and dysarthria. Left arm numbness. Symptoms began 20 hours ago approximately. Worsening of symptoms during the day. EXAM: CT HEAD WITHOUT CONTRAST TECHNIQUE: Contiguous axial images were obtained from the base of the skull through the vertex without intravenous contrast. COMPARISON:  06/19/2007 FINDINGS: The brainstem and cerebellum are normal. The left cerebral hemispheres normal except for mild chronic small-vessel change of the deep white matter. Right hemisphere shows low density at the frontoparietal junction region measuring about 3-4 cm in size consistent with late acute infarction. Basal ganglia are spared. Insular brain is spared. No evidence of mass effect or hemorrhage. No hydrocephalus or extra-axial collection. I would consider this to a ball the M2, M3 and M 6 regions in therefore would assigned this aspects 7. IMPRESSION: Late acute infarction in the right MCA territory affecting M2, M3 and M6 regions consistent with aspects score 7. No mass effect or hemorrhage. Electronically Signed   By: Nelson Chimes M.D.   On: 10/04/2015 18:59   Carotid Doppler   There is 1-39% bilateral ICA stenosis. Vertebral artery flow is antegrade.    TEE 10/06/2015   attempted  Transcranial Doppler  This was a normal transcranial Doppler study, with normal flow direction and velocity of all identified vessels of the anterior and posterior circulations, with no evidence of stenosis, vasospasm or occlusion. Globally elevated pulsatility indexes suggestive diffuse intracranial atherosclerosis.   PHYSICAL EXAM Pleasant elderly Caucasian male currently not in distress. . Afebrile. Head is nontraumatic. Neck is supple without bruit.    Cardiac exam no murmur or gallop. Lungs are clear  to auscultation. Distal pulses are well felt. Neurological Exam :  Awake alert oriented to time place and person. No aphasia, apraxia dysarthria. Follows commands well. Pupils equal reactive. Fundi not visualized. Vision acuity and fields are adequate. Face is symmetric without weakness. Tongue is midline. Motor system exam no upper or lower extremity drift but mild weakness of left grip and intrinsic hand muscles. Orbits right over left upper extremity. Subjective diminished left hemibody sensation to touch and pinprick. Deep tendon reflexes are 2+ symmetric. Plantars downgoing. Gait was not tested.   ASSESSMENT/PLAN [name] is a 70 y.o. male with history of chronic back pain, HTN, gout and HLD presenting with slurred speech and difficulty walking. He did not receive IV t-PA due to being out of the window.   Stroke:  Non-dominant right MCA infarct embolic secondary to unknown source  Resultant  Left hemiparesis, dysarthria, L hemisensory deficit  CT old R MCA infarct, aspects 7  MRI  / MRA  Metal dental implats, L arm plate and screws, works as a Building control surveyor  Carotid Doppler  No significant stenosis   2D Echo  pending   Unable to tolerated TEE. Cardiology planning repeat TEE under general on Thursday - patient upset and wants to go home - anticipate he may not come back  [place] electrophysiologist place implantable loop recorder  to evaluate for atrial fibrillation without TEE given need for GA. This was discussed with Dr. Rayann Heman and his team. They will place loop today.  TCD Diffuse intracranial atherosclerosis, otherwise normal  Check CTA head and neck evolving R MCA territory infarct. Distal R M2 occlusion corresponding with acute stroke  LDL 84  HgbA1c 5.4  Heparin 5000 units sq tid for VTE prophylaxis    No antithrombotic prior to admission, now on aspirin 325 mg daily  Patient counseled to be compliant with his antithrombotic medications  Ongoing aggressive stroke risk factor management  Counseled on Healthy diet and active life style  Therapy recommendations: no PT, OT pending  Disposition:  Return home  Okay for discharge home after loop placement from stroke standpoint  Follow-up with Dr. Leonie Man in 2 months, order written  Hyperlipidemia  Home meds:  No statin  LDL 84, goal < 70  Add zocor 20 mg  Continue statin at discharge  Other Stroke Risk Factors  Advanced age  Cigarette smoker, advised to stop smoking  ETOH use, advised to drink no more than 2 drink(s) a day  Family hx stroke (father - aneurysm)  Other Active Problems  Elevated creatinine 2.0  Chronic pain  Asymptomatic bradycardia  Hospital day # 2  Radene Journey [facility] for Pager information 10/06/2015 10:25 AM  I have personally examined this patient, reviewed notes, independently viewed imaging studies, participated in medical decision making and plan of care. I have made any additions or clarifications directly to the above note. Agree with note above.  Recommend outpatient TEE later patient is willing. Loop recorder insertion today prior to discharge. Follow-up as an outpatient in stroke clinic in 2 months. Stroke team will sign off. Discussed with family practice service medical resident.  Antony Contras, MD Medical Director [facility] Pager: [phone] 10/06/2015  1:52 PM     To contact Stroke Continuity provider, please refer to [email or web address] After hours, contact General Neurology

## 2015-10-06 NOTE — Progress Notes (Signed)
Family Medicine Teaching Service Daily Progress Note Intern Pager: [phone]  Patient name: Bobby Nguyen Medical record number: [id number] Date of birth: 09/16/1945 Age: 70 y.o. Gender: male  Primary Care Provider: No primary care provider on file. Consultants: Neurology Code Status: Full code  Pt Overview and Major Events to Date:  Head CT, Neurology consult, Xray,  Assessment and Plan: Bobby Nguyen is a 70 y.o. male presenting with Right MCA Infarct. PMH is significant for hypertension, hyperlipidemia, tobacco abuse, and gout.   # CVA: This morning patient symptoms continue to improve. A1c is 5.4, Echocardiogram shows EF 55-60% with normal wall motion and no cardiac emboli. CTA neck overnight show some artherosclerosis with no major stenosis. CTA head shows evolving MCA infarct without hemorrhagic conversion.  --Cardiac Monitoring -- TEE was canceled this morning due to agitation prior to procedure   -- Consider duplex US of lower extremities if Patent Foramen Ovale is not close on TEE -- Neurology consulted in ED. Appreciate recommendations. -- Aspirin   # Hypertension: Home medications include Atenolol 50mg  and Lisinopril 40mg . BP this morning 145/63  -- Restart  home antihypertensives   # Elevated Creatinine: Creatinine 1.39 this morning improve from 2.0 yesterday -- Continue NS@125cc /hr x12hr -- Holding home Lisinopril.  # Hyperlipidemia: Patient on fenofibrate, patient has myalgias with statins. Patient lipid panel appropriate for age and comorbidities. --Will continue Fenofibrate 160 once daily   # Chronic Pain: Home medications include Morphine 15mg  q12hr, Percocet 5-325mg  q8hr as needed. -- Continue home medications.  # Tobacco Abuse: Smokes 2ppd -- Nicotine Patch  # Alcohol Use: Reports 3-4 beers or more daily -- On CIWA protocol  FEN/GI: Heart Healthy, NS@125cc /hr x12hr Prophylaxis: Heparin  Disposition: Admit to FMTS  Subjective:  Patient feeling  better this morning, continue to improve. Patient is ambulating on his own with no assistance, close to baseline. Feels he is ready to go home. Denies, nausea, vomiting, headaches, diarrhea, vision troubles, sluredr speech  Objective: Temp:  [97.5 F (36.4 C)-98.7 F (37.1 C)] 98.4 F (36.9 C) (07/11 0825) Pulse Rate:  [49-69] 59 (07/11 0920) Resp:  [10-24] 14 (07/11 0920) BP: (121-181)/(52-72) 136/57 mmHg (07/11 0920) SpO2:  [95 %-100 %] 100 % (07/11 0920) Physical Exam: General: 70yo male resting comfortably in no apparent distress Eyes: PERRLA, White Sclera, EOM intact ENTM: Moist mucous membranes Neck: Stiff secondary to fusion Cardiovascular: S1 and S2 noted, no murmurs, regular rate and rhythm Respiratory: Bibasilar crackles noted, No increased work of breathing Abdomen: Soft and nondistended, nontender MSK: No edema noted, Muscle strength 5/5 in right upper and lower extremity and 4/5 in left upper and lower extremity. Skin: No rashes noted. Warm. Neuro: Slightly decreased but improving sensation over left upper and lower extremities, CN II-XII normal except for V (decreased facial sensation on left), Slurred and Delayed Speech. Psych: Alert, Oriented x3  Laboratory:  Recent Labs Lab 10/04/15 1840 10/04/15 1850  WBC 9.6  --   HGB 13.8 14.6  HCT 41.4 43.0  PLT 213  --     Recent Labs Lab 10/04/15 1840 10/04/15 1850 10/05/15 1001 10/06/15 0747  NA 137 139 138 137  K 4.2 4.2 4.0 4.3  CL 105 104 106 106  CO2 24  --  26 26  BUN 36* 38* 28* 23*  CREATININE 1.96* 2.00* 1.62* 1.39*  CALCIUM 9.4  --  9.3 9.2  PROT 6.7  --   --   --   BILITOT 0.6  --   --   --  ALKPHOS 41  --   --   --   ALT 8*  --   --   --   AST 20  --   --   --   GLUCOSE 92 87 96 99     Imaging/Diagnostic Tests:  The heart size and mediastinal contours are within normal limits. Stable parenchymal scarring bilaterally. There is no evidence of pulmonary edema, consolidation, pneumothorax,  nodule or pleural fluid. Stable appearance of old left-sided rib fractures  CTA HEAD and NECK  IMPRESSION: CTA HEAD: Evolving small to moderate RIGHT MCA territory infarct without hemorrhagic conversion.  Otherwise negative CT HEAD for age.  CTA NECK: Atherosclerosis without hemodynamically significant stenosis or acute vascular process.  CTA HEAD: Distal RIGHT M2 occlusion corresponding to known late acute MCA territory infarct.  Mild intracranial atherosclerosis   Marjie Skiff, MD 10/06/2015, 9:38 AM PGY-1, [place] Intern pager: [phone], text pages welcome

## 2015-10-06 NOTE — Progress Notes (Signed)
Discharge instruction reviewed with patient family. [employer] team came and reviewed loop recorder device with patient/family. All other questions answered. Transport home by family.  Ave Filter, RN

## 2015-10-06 NOTE — Progress Notes (Signed)
Patient return from loop recorder placement. VSS. Attending resident paged and notified. [employer] will come and review with pt/family r/t loop recorder per Endo staff. No other distress voice at this time.    Ave Filter, RN

## 2015-10-07 ENCOUNTER — Encounter (HOSPITAL_COMMUNITY): Payer: Self-pay | Admitting: Internal Medicine

## 2015-10-07 ENCOUNTER — Encounter: Payer: Self-pay | Admitting: Internal Medicine

## 2015-10-07 ENCOUNTER — Telehealth: Payer: Self-pay | Admitting: Internal Medicine

## 2015-10-07 NOTE — Telephone Encounter (Signed)
New Message  P wife calling to speak w/ RN- stated that she was to call today and resch TEE- stated taht Dr Cletis [place] office told her to call here and resch. Please call back and discuss.

## 2015-10-07 NOTE — Telephone Encounter (Signed)
TEE is scheduled for 10am.  Be at the [facility] at 8:30am

## 2015-10-08 ENCOUNTER — Ambulatory Visit (HOSPITAL_COMMUNITY): Payer: Medicare Other | Admitting: Certified Registered Nurse Anesthetist

## 2015-10-08 ENCOUNTER — Ambulatory Visit (HOSPITAL_BASED_OUTPATIENT_CLINIC_OR_DEPARTMENT_OTHER): Payer: Medicare Other

## 2015-10-08 ENCOUNTER — Ambulatory Visit (HOSPITAL_COMMUNITY)
Admission: RE | Admit: 2015-10-08 | Discharge: 2015-10-08 | Disposition: A | Discharge status: Home / Self Care | Payer: Medicare Other | Source: Ambulatory Visit | Attending: Internal Medicine | Admitting: Internal Medicine

## 2015-10-08 ENCOUNTER — Encounter (HOSPITAL_COMMUNITY)
Admission: RE | Disposition: A | Discharge status: Home / Self Care | Payer: Self-pay | Source: Ambulatory Visit | Attending: Internal Medicine

## 2015-10-08 ENCOUNTER — Encounter (HOSPITAL_COMMUNITY): Payer: Self-pay | Admitting: *Deleted

## 2015-10-08 DIAGNOSIS — I361 Nonrheumatic tricuspid (valve) insufficiency: Secondary | ICD-10-CM | POA: Diagnosis not present

## 2015-10-08 DIAGNOSIS — Z09 Encounter for follow-up examination after completed treatment for conditions other than malignant neoplasm: Secondary | ICD-10-CM | POA: Insufficient documentation

## 2015-10-08 DIAGNOSIS — F17210 Nicotine dependence, cigarettes, uncomplicated: Secondary | ICD-10-CM | POA: Diagnosis not present

## 2015-10-08 DIAGNOSIS — I69322 Dysarthria following cerebral infarction: Secondary | ICD-10-CM | POA: Insufficient documentation

## 2015-10-08 DIAGNOSIS — I69398 Other sequelae of cerebral infarction: Secondary | ICD-10-CM | POA: Diagnosis not present

## 2015-10-08 DIAGNOSIS — G8929 Other chronic pain: Secondary | ICD-10-CM | POA: Insufficient documentation

## 2015-10-08 DIAGNOSIS — Z79899 Other long term (current) drug therapy: Secondary | ICD-10-CM | POA: Insufficient documentation

## 2015-10-08 DIAGNOSIS — I340 Nonrheumatic mitral (valve) insufficiency: Secondary | ICD-10-CM | POA: Diagnosis not present

## 2015-10-08 DIAGNOSIS — E785 Hyperlipidemia, unspecified: Secondary | ICD-10-CM | POA: Diagnosis not present

## 2015-10-08 DIAGNOSIS — I69354 Hemiplegia and hemiparesis following cerebral infarction affecting left non-dominant side: Secondary | ICD-10-CM | POA: Diagnosis not present

## 2015-10-08 DIAGNOSIS — J984 Other disorders of lung: Secondary | ICD-10-CM | POA: Diagnosis not present

## 2015-10-08 DIAGNOSIS — I639 Cerebral infarction, unspecified: Secondary | ICD-10-CM | POA: Insufficient documentation

## 2015-10-08 DIAGNOSIS — Q211 Atrial septal defect: Secondary | ICD-10-CM | POA: Diagnosis not present

## 2015-10-08 DIAGNOSIS — I10 Essential (primary) hypertension: Secondary | ICD-10-CM | POA: Diagnosis not present

## 2015-10-08 HISTORY — PX: TEE WITHOUT CARDIOVERSION: SHX5443

## 2015-10-08 SURGERY — ECHOCARDIOGRAM, TRANSESOPHAGEAL
Anesthesia: Monitor Anesthesia Care

## 2015-10-08 SURGERY — Surgical Case
Anesthesia: *Unknown

## 2015-10-08 MED ORDER — LIDOCAINE HCL (CARDIAC) 20 MG/ML IV SOLN
INTRAVENOUS | Status: DC | PRN
  Administered 2015-10-08: 30 mg via INTRATRACHEAL

## 2015-10-08 MED ORDER — PROPOFOL 500 MG/50ML IV EMUL
INTRAVENOUS | Status: DC | PRN
  Administered 2015-10-08: 75 ug/kg/min via INTRAVENOUS

## 2015-10-08 MED ORDER — SODIUM CHLORIDE 0.9 % IV SOLN
INTRAVENOUS | Status: DC
  Administered 2015-10-08: 09:00:00 via INTRAVENOUS

## 2015-10-08 MED ORDER — LIDOCAINE VISCOUS 2 % MT SOLN
OROMUCOSAL | Status: DC | PRN
Start: 2015-10-08 — End: 2015-10-08
  Administered 2015-10-08: 1 via OROMUCOSAL

## 2015-10-08 MED ORDER — PROPOFOL 10 MG/ML IV BOLUS
INTRAVENOUS | Status: DC | PRN
  Administered 2015-10-08 (×3): 20 mg via INTRAVENOUS

## 2015-10-08 MED ORDER — LIDOCAINE VISCOUS 2 % MT SOLN
OROMUCOSAL | Status: AC
  Filled 2015-10-08: qty 15

## 2015-10-08 NOTE — Anesthesia Postprocedure Evaluation (Signed)
Anesthesia Post Note  Patient: Bobby Nguyen  Procedure(s) Performed: Procedure(s) (LRB): TRANSESOPHAGEAL ECHOCARDIOGRAM (TEE) (N/A)  Patient location during evaluation: PACU Anesthesia Type: General Level of consciousness: sedated and patient cooperative Pain management: pain level controlled Vital Signs Assessment: post-procedure vital signs reviewed and stable Respiratory status: spontaneous breathing Cardiovascular status: stable Anesthetic complications: no    Last Vitals:  Filed Vitals:   10/08/15 1030 10/08/15 1040  BP: 122/104   Pulse: 56 54  Temp: 36.8 C   Resp: 20 16    Last Pain: There were no vitals filed for this visit.               Nolon Nations

## 2015-10-08 NOTE — Progress Notes (Signed)
Echocardiogram Echocardiogram Transesophageal has been performed.  Talmadge, Louque 10/08/2015, 10:44 AM

## 2015-10-08 NOTE — Op Note (Signed)
LA, LAA without masses Tiny PFO as tested by color doppler with rare bubble seen in LA TV normal  Trace TR MV normal  No MR AV mlldly thickened  No AI PV normal  Trace TR LVEF is normal  MIld fixed atherosclerotic plaquing in the thoracic aorta

## 2015-10-08 NOTE — Transfer of Care (Signed)
Immediate Anesthesia Transfer of Care Note  Patient: Bobby Nguyen  Procedure(s) Performed: Procedure(s): TRANSESOPHAGEAL ECHOCARDIOGRAM (TEE) (N/A)  Patient Location: Endoscopy Unit  Anesthesia Type:MAC  Level of Consciousness: awake, alert  and oriented  Airway & Oxygen Therapy: Patient Spontanous Breathing  Post-op Assessment: Report given to RN and Post -op Vital signs reviewed and stable  Post vital signs: Reviewed and stable  Last Vitals:  Filed Vitals:   10/08/15 0819 10/08/15 1019  BP: 155/62   Pulse: 50 58  Temp: 36.7 C   Resp: 12 12    Last Pain: There were no vitals filed for this visit.       Complications: No apparent anesthesia complications

## 2015-10-08 NOTE — H&P (View-Only) (Signed)
STROKE TEAM PROGRESS NOTE   SUBJECTIVE (INTERVAL HISTORY) His wife and daughter are at the bedside. He is just back from attempted TEE - the could not do - plan to reattempt under GA on Thurs. Patient currently upset "that was a damn waste of time". Can consider discharge and doing as an OP on Thurs. Dr. Leonie Man to discuss with attending.    OBJECTIVE Temp:  [97.5 F (36.4 C)-98.7 F (37.1 C)] 98.4 F (36.9 C) (07/11 0825) Pulse Rate:  [49-72] 56 (07/11 0952) Cardiac Rhythm:  [-] Sinus bradycardia (07/11 0945) Resp:  [10-24] 13 (07/11 0952) BP: (121-181)/(51-72) 154/51 mmHg (07/11 0952) SpO2:  [95 %-100 %] 99 % (07/11 0952)  CBC:   Recent Labs Lab 10/04/15 1840 10/04/15 1850  WBC 9.6  --   NEUTROABS 5.3  --   HGB 13.8 14.6  HCT 41.4 43.0  MCV 97.0  --   PLT 213  --     Basic Metabolic Panel:   Recent Labs Lab 10/05/15 1001 10/06/15 0747  NA 138 137  K 4.0 4.3  CL 106 106  CO2 26 26  GLUCOSE 96 99  BUN 28* 23*  CREATININE 1.62* 1.39*  CALCIUM 9.3 9.2    Lipid Panel:     Component Value Date/Time   CHOL 156 10/05/2015 0658   TRIG 155* 10/05/2015 0658   HDL 41 10/05/2015 0658   CHOLHDL 3.8 10/05/2015 0658   VLDL 31 10/05/2015 0658   LDLCALC 84 10/05/2015 0658   HgbA1c:  Lab Results  Component Value Date   HGBA1C 5.4 10/05/2015   Urine Drug Screen: No results found for: LABOPIA, COCAINSCRNUR, LABBENZ, AMPHETMU, THCU, LABBARB    IMAGING  Ct Angio Head W Or Wo Contrast  10/06/2015  CLINICAL DATA:  Follow-up embolic infarct. EXAM: CT ANGIOGRAPHY HEAD AND NECK TECHNIQUE: Multidetector CT imaging of the head and neck was performed using the standard protocol during bolus administration of intravenous contrast. Multiplanar CT image reconstructions and MIPs were obtained to evaluate the vascular anatomy. Carotid stenosis measurements (when applicable) are obtained utilizing NASCET criteria, using the distal internal carotid diameter as the denominator. CONTRAST:   50 cc Isovue 370 history of hypertension hyperlipidemia. COMPARISON:  CT HEAD October 04, 2015 FINDINGS: CT HEAD INTRACRANIAL CONTENTS: Evolving RIGHT frontal parietal insular/operculum or wedge-like hypodensity without further propagation. No hemorrhagic conversion. Ventricles and sulci are overall normal for patient's age. No midline shift. No abnormal extra-axial fluid collections. Mild white matter changes most compatible with chronic small vessel ischemic disease are less than expected for age. ORBITS: The included ocular globes and orbital contents are non-suspicious. SINUSES: The mastoid aircells and included paranasal sinuses are well-aerated. SKULL/SOFT TISSUES: No skull fracture. No significant soft tissue swelling. CTA NECK AORTIC ARCH: Normal appearance of the thoracic arch, normal branch pattern. Mild calcific atherosclerosis aortic arch. The origins of the innominate, left Common carotid artery and subclavian artery are widely patent. RIGHT CAROTID SYSTEM: Common carotid artery is widely patent, coursing in a straight line fashion. Normal appearance of the carotid bifurcation without hemodynamically significant stenosis by NASCET criteria. Mild eccentric calcific atherosclerosis. Normal appearance of the included internal carotid artery. LEFT CAROTID SYSTEM: Common carotid artery is widely patent, coursing in a straight line fashion. Normal appearance of the carotid bifurcation without hemodynamically significant stenosis by NASCET criteria. Mild eccentric calcific atherosclerosis. Normal appearance of the included internal carotid artery. VERTEBRAL ARTERIES:RIGHT vertebral artery is dominant. Normal appearance of the vertebral arteries, which appear widely patent. SKELETON: No acute  osseous process though bone windows have not been submitted. Mild degenerative change of the cervical spine. Old T1 and T2 spinous process fractures with nonunion. OTHER NECK: Soft tissues of the neck are non-acute though,  not tailored for evaluation. Included apical heterogeneous lung attenuation most compatible with small airway disease associated bronchial wall thickening, centrilobular and paraseptal emphysema predominately on the RIGHT. CTA HEAD ANTERIOR CIRCULATION: Normal appearance of the cervical internal carotid arteries, petrous, cavernous and supra clinoid internal carotid arteries. Widely patent anterior communicating artery. Patent of the anterior and LEFT middle cerebral arteries. Distal RIGHT M2 occlusion, best seen on sagittal 83/250. No hemodynamically significant stenosis, dissection, contrast extravasation or aneurysm. Mild luminal irregularity of the middle and anterior cerebral arteries. POSTERIOR CIRCULATION: Normal appearance of the vertebral arteries, vertebrobasilar junction and basilar artery, as well as main branch vessels. Normal appearance of the posterior cerebral arteries. Robust RIGHT posterior communicating artery. No large vessel occlusion, hemodynamically significant stenosis, dissection, contrast extravasation or aneurysm. Mild luminal irregularity of posterior cerebral arteries. VENOUS SINUSES: Major dural venous sinuses are patent though not tailored for evaluation on this angiographic examination. ANATOMIC VARIANTS: None. DELAYED PHASE: No abnormal intracranial enhancement. IMPRESSION: CTA HEAD: Evolving small to moderate RIGHT MCA territory infarct without hemorrhagic conversion. Otherwise negative CT HEAD for age. CTA NECK: Atherosclerosis without hemodynamically significant stenosis or acute vascular process. CTA HEAD: Distal RIGHT M2 occlusion corresponding to known late acute MCA territory infarct. Mild intracranial atherosclerosis. Electronically Signed   By: Elon Alas M.D.   On: 10/06/2015 03:10   Dg Chest 2 View  10/05/2015  CLINICAL DATA:  Cerebral infarction. EXAM: CHEST  2 VIEW COMPARISON:  11/25/2009 FINDINGS: The heart size and mediastinal contours are within normal  limits. Stable parenchymal scarring bilaterally. There is no evidence of pulmonary edema, consolidation, pneumothorax, nodule or pleural fluid. Stable appearance of old left-sided rib fractures. IMPRESSION: No active cardiopulmonary disease. Electronically Signed   By: Aletta Edouard M.D.   On: 10/05/2015 07:52   Ct Angio Neck W Or Wo Contrast  10/06/2015  CLINICAL DATA:  Follow-up embolic infarct. EXAM: CT ANGIOGRAPHY HEAD AND NECK TECHNIQUE: Multidetector CT imaging of the head and neck was performed using the standard protocol during bolus administration of intravenous contrast. Multiplanar CT image reconstructions and MIPs were obtained to evaluate the vascular anatomy. Carotid stenosis measurements (when applicable) are obtained utilizing NASCET criteria, using the distal internal carotid diameter as the denominator. CONTRAST:  50 cc Isovue 370 history of hypertension hyperlipidemia. COMPARISON:  CT HEAD October 04, 2015 FINDINGS: CT HEAD INTRACRANIAL CONTENTS: Evolving RIGHT frontal parietal insular/operculum or wedge-like hypodensity without further propagation. No hemorrhagic conversion. Ventricles and sulci are overall normal for patient's age. No midline shift. No abnormal extra-axial fluid collections. Mild white matter changes most compatible with chronic small vessel ischemic disease are less than expected for age. ORBITS: The included ocular globes and orbital contents are non-suspicious. SINUSES: The mastoid aircells and included paranasal sinuses are well-aerated. SKULL/SOFT TISSUES: No skull fracture. No significant soft tissue swelling. CTA NECK AORTIC ARCH: Normal appearance of the thoracic arch, normal branch pattern. Mild calcific atherosclerosis aortic arch. The origins of the innominate, left Common carotid artery and subclavian artery are widely patent. RIGHT CAROTID SYSTEM: Common carotid artery is widely patent, coursing in a straight line fashion. Normal appearance of the carotid  bifurcation without hemodynamically significant stenosis by NASCET criteria. Mild eccentric calcific atherosclerosis. Normal appearance of the included internal carotid artery. LEFT CAROTID SYSTEM: Common carotid artery is widely  patent, coursing in a straight line fashion. Normal appearance of the carotid bifurcation without hemodynamically significant stenosis by NASCET criteria. Mild eccentric calcific atherosclerosis. Normal appearance of the included internal carotid artery. VERTEBRAL ARTERIES:RIGHT vertebral artery is dominant. Normal appearance of the vertebral arteries, which appear widely patent. SKELETON: No acute osseous process though bone windows have not been submitted. Mild degenerative change of the cervical spine. Old T1 and T2 spinous process fractures with nonunion. OTHER NECK: Soft tissues of the neck are non-acute though, not tailored for evaluation. Included apical heterogeneous lung attenuation most compatible with small airway disease associated bronchial wall thickening, centrilobular and paraseptal emphysema predominately on the RIGHT. CTA HEAD ANTERIOR CIRCULATION: Normal appearance of the cervical internal carotid arteries, petrous, cavernous and supra clinoid internal carotid arteries. Widely patent anterior communicating artery. Patent of the anterior and LEFT middle cerebral arteries. Distal RIGHT M2 occlusion, best seen on sagittal 83/250. No hemodynamically significant stenosis, dissection, contrast extravasation or aneurysm. Mild luminal irregularity of the middle and anterior cerebral arteries. POSTERIOR CIRCULATION: Normal appearance of the vertebral arteries, vertebrobasilar junction and basilar artery, as well as main branch vessels. Normal appearance of the posterior cerebral arteries. Robust RIGHT posterior communicating artery. No large vessel occlusion, hemodynamically significant stenosis, dissection, contrast extravasation or aneurysm. Mild luminal irregularity of  posterior cerebral arteries. VENOUS SINUSES: Major dural venous sinuses are patent though not tailored for evaluation on this angiographic examination. ANATOMIC VARIANTS: None. DELAYED PHASE: No abnormal intracranial enhancement. IMPRESSION: CTA HEAD: Evolving small to moderate RIGHT MCA territory infarct without hemorrhagic conversion. Otherwise negative CT HEAD for age. CTA NECK: Atherosclerosis without hemodynamically significant stenosis or acute vascular process. CTA HEAD: Distal RIGHT M2 occlusion corresponding to known late acute MCA territory infarct. Mild intracranial atherosclerosis. Electronically Signed   By: Elon Alas M.D.   On: 10/06/2015 03:10   Ct Head Code Stroke W/o Cm  10/04/2015  CLINICAL DATA:  Left-sided facial droop and dysarthria. Left arm numbness. Symptoms began 20 hours ago approximately. Worsening of symptoms during the day. EXAM: CT HEAD WITHOUT CONTRAST TECHNIQUE: Contiguous axial images were obtained from the base of the skull through the vertex without intravenous contrast. COMPARISON:  06/19/2007 FINDINGS: The brainstem and cerebellum are normal. The left cerebral hemispheres normal except for mild chronic small-vessel change of the deep white matter. Right hemisphere shows low density at the frontoparietal junction region measuring about 3-4 cm in size consistent with late acute infarction. Basal ganglia are spared. Insular brain is spared. No evidence of mass effect or hemorrhage. No hydrocephalus or extra-axial collection. I would consider this to a ball the M2, M3 and M 6 regions in therefore would assigned this aspects 7. IMPRESSION: Late acute infarction in the right MCA territory affecting M2, M3 and M6 regions consistent with aspects score 7. No mass effect or hemorrhage. Electronically Signed   By: Nelson Chimes M.D.   On: 10/04/2015 18:59   Carotid Doppler   There is 1-39% bilateral ICA stenosis. Vertebral artery flow is antegrade.    TEE 10/06/2015   attempted  Transcranial Doppler  This was a normal transcranial Doppler study, with normal flow direction and velocity of all identified vessels of the anterior and posterior circulations, with no evidence of stenosis, vasospasm or occlusion. Globally elevated pulsatility indexes suggestive diffuse intracranial atherosclerosis.   PHYSICAL EXAM Pleasant elderly Caucasian male currently not in distress. . Afebrile. Head is nontraumatic. Neck is supple without bruit.    Cardiac exam no murmur or gallop. Lungs are clear  to auscultation. Distal pulses are well felt. Neurological Exam :  Awake alert oriented to time place and person. No aphasia, apraxia dysarthria. Follows commands well. Pupils equal reactive. Fundi not visualized. Vision acuity and fields are adequate. Face is symmetric without weakness. Tongue is midline. Motor system exam no upper or lower extremity drift but mild weakness of left grip and intrinsic hand muscles. Orbits right over left upper extremity. Subjective diminished left hemibody sensation to touch and pinprick. Deep tendon reflexes are 2+ symmetric. Plantars downgoing. Gait was not tested.   ASSESSMENT/PLAN Bobby Nguyen is a 70 y.o. male with history of chronic back pain, HTN, gout and HLD presenting with slurred speech and difficulty walking. He did not receive IV t-PA due to being out of the window.   Stroke:  Non-dominant right MCA infarct embolic secondary to unknown source  Resultant  Left hemiparesis, dysarthria, L hemisensory deficit  CT old R MCA infarct, aspects 7  MRI  / MRA  Metal dental implats, L arm plate and screws, works as a Building control surveyor  Carotid Doppler  No significant stenosis   2D Echo  pending   Unable to tolerated TEE. Cardiology planning repeat TEE under general on Thursday - patient upset and wants to go home - anticipate he may not come back  [place] electrophysiologist place implantable loop recorder  to evaluate for atrial fibrillation without TEE given need for GA. This was discussed with Dr. Rayann Heman and his team. They will place loop today.  TCD Diffuse intracranial atherosclerosis, otherwise normal  Check CTA head and neck evolving R MCA territory infarct. Distal R M2 occlusion corresponding with acute stroke  LDL 84  HgbA1c 5.4  Heparin 5000 units sq tid for VTE prophylaxis    No antithrombotic prior to admission, now on aspirin 325 mg daily  Patient counseled to be compliant with his antithrombotic medications  Ongoing aggressive stroke risk factor management  Counseled on Healthy diet and active life style  Therapy recommendations: no PT, OT pending  Disposition:  Return home  Okay for discharge home after loop placement from stroke standpoint  Follow-up with Dr. Leonie Man in 2 months, order written  Hyperlipidemia  Home meds:  No statin  LDL 84, goal < 70  Add zocor 20 mg  Continue statin at discharge  Other Stroke Risk Factors  Advanced age  Cigarette smoker, advised to stop smoking  ETOH use, advised to drink no more than 2 drink(s) a day  Family hx stroke (father - aneurysm)  Other Active Problems  Elevated creatinine 2.0  Chronic pain  Asymptomatic bradycardia  Hospital day # 2  Radene Journey [facility] for Pager information 10/06/2015 10:25 AM  I have personally examined this patient, reviewed notes, independently viewed imaging studies, participated in medical decision making and plan of care. I have made any additions or clarifications directly to the above note. Agree with note above.  Recommend outpatient TEE later patient is willing. Loop recorder insertion today prior to discharge. Follow-up as an outpatient in stroke clinic in 2 months. Stroke team will sign off. Discussed with family practice service medical resident.  Antony Contras, MD Medical Director [facility] Pager: [phone] 10/06/2015  1:52 PM     To contact Stroke Continuity provider, please refer to [email or web address] After hours, contact General Neurology

## 2015-10-08 NOTE — Anesthesia Preprocedure Evaluation (Addendum)
Anesthesia Evaluation  Patient identified by MRN, date of birth, ID band Patient awake    Reviewed: Allergy & Precautions, NPO status , Patient's Chart, lab work & pertinent test results  Airway Mallampati: II  TM Distance: >3 FB Neck ROM: Full    Dental no notable dental hx.    Pulmonary Current Smoker,    Pulmonary exam normal breath sounds clear to auscultation       Cardiovascular hypertension, Pt. on medications Normal cardiovascular exam Rhythm:Regular Rate:Normal  Echo 10/05/2015 ------------------------------------------------------------------- Study Conclusions - Left ventricle: The cavity size was normal. Wall thickness was increased in a pattern of mild LVH. Systolic function was normal. The estimated ejection fraction was in the range of 55% to 60%. Wall motion was normal; there were no regional wall motion abnormalities. There was no evidence of elevated ventricular filling pressure by Doppler parameters.  Impressions: - No cardiac source of emboli was indentified.   Neuro/Psych TIACVA negative psych ROS   GI/Hepatic negative GI ROS, Neg liver ROS,   Endo/Other  negative endocrine ROS  Renal/GU negative Renal ROS     Musculoskeletal negative musculoskeletal ROS (+)   Abdominal   Peds  Hematology negative hematology ROS (+)   Anesthesia Other Findings   Reproductive/Obstetrics                           Anesthesia Physical Anesthesia Plan  ASA: III  Anesthesia Plan: MAC   Post-op Pain Management:    Induction: Intravenous  Airway Management Planned:   Additional Equipment:   Intra-op Plan:   Post-operative Plan:   Informed Consent: I have reviewed the patients History and Physical, chart, labs and discussed the procedure including the risks, benefits and alternatives for the proposed anesthesia with the patient or authorized representative who has indicated  his/her understanding and acceptance.   Dental advisory given  Plan Discussed with: CRNA  Anesthesia Plan Comments:         Anesthesia Quick Evaluation

## 2015-10-08 NOTE — Interval H&P Note (Signed)
History and Physical Interval Note:  10/08/2015 8:11 AM  Bobby Nguyen  has presented today for surgery, with the diagnosis of STROKE  The various methods of treatment have been discussed with the patient and family. After consideration of risks, benefits and other options for treatment, the patient has consented to  Procedure(s): TRANSESOPHAGEAL ECHOCARDIOGRAM (TEE) (N/A) as a surgical intervention .  The patient's history has been reviewed, patient examined, no change in status, stable for surgery.  I have reviewed the patient's chart and labs.  Questions were answered to the patient's satisfaction.     Dorris Carnes

## 2015-10-08 NOTE — Discharge Summary (Signed)
[facility] Discharge Summary  Patient name: Bobby Nguyen Medical record number: [id number] Date of birth: [birth year] Age: 70 y.o. Gender: male Date of Admission: 10/04/2015  Date of Discharge: 10/06/2015 Admitting Physician: Lind Covert, MD  Primary Care Provider: Daiva Eves Consultants: Neurology, Cardiology  Indication for Hospitalization: Stroke rule out  Discharge Diagnoses/Problem List:  Stroke  Disposition: Home  Discharge Condition: Stable  Discharge Exam: General: 70yo male resting comfortably in no apparent distress Eyes: PERRLA, White Sclera, EOM intact ENTM: Moist mucous membranes Neck: Stiff secondary to fusion Cardiovascular: S1 and S2 noted, no murmurs, regular rate and rhythm Respiratory: Bibasilar crackles noted, No increased work of breathing Abdomen: Soft and nondistended, nontender MSK: No edema noted, Muscle strength 5/5 in right upper and lower extremity and 4/5 in left upper and lower extremity. Skin: No rashes noted. Warm. Neuro: Slightly decreased but improving sensation over left upper and lower extremities, CN II-XII normal except for V (decreased facial sensation on left), Slurred and Delayed Speech. Psych: Alert, Oriented x3   Brief Hospital Course:  Bobby Nguyen is a 70 y.o. male presenting with Right MCA Infarct. PMH is significant for hypertension, hyperlipidemia, tobacco abuse, and gout.   # CVA:  Patient admitted for left sided weakness and slurred speech on 7/9. CT Head showed a right MCA infarct without hemorrhagic conversion. Patient was seen by neurology and had a full workup which included CTA neck showing some atherosclerosis with no major stenosis. Patient also had an echocardiogram which showed an EF of 55-60%. Patient was schedule for TEE and subsequent placement of a loop recorder, but patient was unable to tolerate TEE and procedure was cancelled and reschedule for 10/08/2015. -Follow up on  TEE and loop recorder placement as an outpatient.  # Hypertension: Home medications include Atenolol 86m and Lisinopril 436m BP this morning 145/63  -- Restart home antihypertensives   # Elevated Creatinine: Creatinine on discharge day is 1.39 down from 2.0  -- Resume home Lisinopril.  # Hyperlipidemia: Patient on fenofibrate, patient has myalgias with statins. Patient lipid panel appropriate for age and comorbidities. --Will continue Fenofibrate 160 once daily  # Chronic Pain: Home medications include Morphine 1559m12hr, Percocet 5-325m11mhr as needed. -- Continue home medications.  # Tobacco Abuse: Smokes 2ppd -- Nicotine Patch  Issues for Follow Up:  1. Outpatient TEE scheduled for 10/08/2015 2. Loop recorder placement follow up with cardiology  Significant Procedures: Echo, CT head and spine, CT angio Head and Neck, Carotid US  [place] Labs and Imaging:  Recent Results (from the past 2160 hour(s))  CBG monitoring, ED     Status: None   Collection Time: 10/04/15  6:38 PM  Result Value Ref Range   Glucose-Capillary 94 65 - 99 mg/dL  Protime-INR     Status: None   Collection Time: 10/04/15  6:40 PM  Result Value Ref Range   Prothrombin Time 13.5 11.6 - 15.2 seconds   INR 1.01 0.00 - 1.49  APTT     Status: None   Collection Time: 10/04/15  6:40 PM  Result Value Ref Range   aPTT 30 24 - 37 seconds  CBC     Status: None   Collection Time: 10/04/15  6:40 PM  Result Value Ref Range   WBC 9.6 4.0 - 10.5 K/uL   RBC 4.27 4.22 - 5.81 MIL/uL   Hemoglobin 13.8 13.0 - 17.0 g/dL   HCT 41.4 39.0 - 52.0 %   MCV 97.0  78.0 - 100.0 fL   MCH 32.3 26.0 - 34.0 pg   MCHC 33.3 30.0 - 36.0 g/dL   RDW 14.4 11.5 - 15.5 %   Platelets 213 150 - 400 K/uL  Differential     Status: Abnormal   Collection Time: 10/04/15  6:40 PM  Result Value Ref Range   Neutrophils Relative % 56 %   Neutro Abs 5.3 1.7 - 7.7 K/uL   Lymphocytes Relative 28 %   Lymphs Abs 2.7 0.7 - 4.0 K/uL    Monocytes Relative 12 %   Monocytes Absolute 1.2 (H) 0.1 - 1.0 K/uL   Eosinophils Relative 4 %   Eosinophils Absolute 0.4 0.0 - 0.7 K/uL   Basophils Relative 0 %   Basophils Absolute 0.0 0.0 - 0.1 K/uL  Comprehensive metabolic panel     Status: Abnormal   Collection Time: 10/04/15  6:40 PM  Result Value Ref Range   Sodium 137 135 - 145 mmol/L   Potassium 4.2 3.5 - 5.1 mmol/L   Chloride 105 101 - 111 mmol/L   CO2 24 22 - 32 mmol/L   Glucose, Bld 92 65 - 99 mg/dL   BUN 36 (H) 6 - 20 mg/dL   Creatinine, Ser 1.96 (H) 0.61 - 1.24 mg/dL   Calcium 9.4 8.9 - 10.3 mg/dL   Total Protein 6.7 6.5 - 8.1 g/dL   Albumin 3.8 3.5 - 5.0 g/dL   AST 20 15 - 41 U/L   ALT 8 (L) 17 - 63 U/L   Alkaline Phosphatase 41 38 - 126 U/L   Total Bilirubin 0.6 0.3 - 1.2 mg/dL   GFR calc non Af Amer 33 (L) >60 mL/min   GFR calc Af Amer 38 (L) >60 mL/min    Comment: (NOTE) The eGFR has been calculated using the CKD EPI equation. This calculation has not been validated in all clinical situations. eGFR's persistently <60 mL/min signify possible Chronic Kidney Disease.    Anion gap 8 5 - 15  I-stat troponin, ED     Status: None   Collection Time: 10/04/15  6:49 PM  Result Value Ref Range   Troponin i, poc 0.00 0.00 - 0.08 ng/mL   Comment 3            Comment: Due to the release kinetics of cTnI, a negative result within the first hours of the onset of symptoms does not rule out myocardial infarction with certainty. If myocardial infarction is still suspected, repeat the test at appropriate intervals.   I-Stat Chem 8, ED     Status: Abnormal   Collection Time: 10/04/15  6:50 PM  Result Value Ref Range   Sodium 139 135 - 145 mmol/L   Potassium 4.2 3.5 - 5.1 mmol/L   Chloride 104 101 - 111 mmol/L   BUN 38 (H) 6 - 20 mg/dL   Creatinine, Ser 2.00 (H) 0.61 - 1.24 mg/dL   Glucose, Bld 87 65 - 99 mg/dL   Calcium, Ion 1.22 1.12 - 1.23 mmol/L   TCO2 24 0 - 100 mmol/L   Hemoglobin 14.6 13.0 - 17.0 g/dL   HCT  43.0 39.0 - 52.0 %  TSH     Status: None   Collection Time: 10/04/15  9:38 PM  Result Value Ref Range   TSH 0.953 0.350 - 4.500 uIU/mL  Hemoglobin A1c     Status: None   Collection Time: 10/05/15  6:58 AM  Result Value Ref Range   Hgb A1c MFr Bld 5.4 4.8 -  5.6 %    Comment: (NOTE)         Pre-diabetes: 5.7 - 6.4         Diabetes: >6.4         Glycemic control for adults with diabetes: <7.0    Mean Plasma Glucose 108 mg/dL    Comment: (NOTE) Performed At: [facility] [place], [place] [id number] Lindon Romp MD OJ:[id number]   Lipid panel     Status: Abnormal   Collection Time: 10/05/15  6:58 AM  Result Value Ref Range   Cholesterol 156 0 - 200 mg/dL   Triglycerides 155 (H) <150 mg/dL   HDL 41 >40 mg/dL   Total CHOL/HDL Ratio 3.8 RATIO   VLDL 31 0 - 40 mg/dL   LDL Cholesterol 84 0 - 99 mg/dL    Comment:        Total Cholesterol/HDL:CHD Risk Coronary Heart Disease Risk Table                     Men   Women  1/2 Average Risk   3.4   3.3  Average Risk       5.0   4.4  2 X Average Risk   9.6   7.1  3 X Average Risk  23.4   11.0        Use the calculated Patient Ratio above and the CHD Risk Table to determine the patient's CHD Risk.        ATP III CLASSIFICATION (LDL):  <100     mg/dL   Optimal  100-129  mg/dL   Near or Above                    Optimal  130-159  mg/dL   Borderline  160-189  mg/dL   High  >190     mg/dL   Very High   Basic metabolic panel     Status: Abnormal   Collection Time: 10/05/15 10:01 AM  Result Value Ref Range   Sodium 138 135 - 145 mmol/L   Potassium 4.0 3.5 - 5.1 mmol/L   Chloride 106 101 - 111 mmol/L   CO2 26 22 - 32 mmol/L   Glucose, Bld 96 65 - 99 mg/dL   BUN 28 (H) 6 - 20 mg/dL   Creatinine, Ser 1.62 (H) 0.61 - 1.24 mg/dL   Calcium 9.3 8.9 - 10.3 mg/dL   GFR calc non Af Amer 41 (L) >60 mL/min   GFR calc Af Amer 48 (L) >60 mL/min    Comment: (NOTE) The eGFR has been calculated using the CKD EPI  equation. This calculation has not been validated in all clinical situations. eGFR's persistently <60 mL/min signify possible Chronic Kidney Disease.    Anion gap 6 5 - 15  Echocardiogram     Status: Abnormal   Collection Time: 10/05/15 10:58 AM  Result Value Ref Range   Weight 2705.49 oz   Height 68.5 in   BP 112/53 mmHg   LV PW d 13.1 (A) 0.6 - 1.1 mm   FS 43 28 - 44 %   LA vol 71.6 mL   LA ID, A-P, ES 40 mm   IVS/LV PW RATIO, ED .74    LV e' LATERAL 9.57 cm/s   LV E/e' medial 10.45    LV E/e'average 10.45    LA diam index 2.09 cm/m2   LA vol A4C 66.5 ml   E decel time 187 msec  LVOT diameter 20 mm   LVOT area 3.14 cm2   Peak grad 4 mmHg   E/e' ratio 10.45    MV pk E vel 100 m/s   MV pk A vel 77.6 m/s   LA vol index 37.5 mL/m2   MV Dec 187    LA diam end sys 40 mm   TDI e' medial 7.29    TDI e' lateral 9.57   VAS US CAROTID     Status: None   Collection Time: 10/05/15 11:13 AM  Result Value Ref Range   Right CCA prox sys 108 cm/s   Right CCA prox dias 9 cm/s   Right cca dist sys -120 cm/s   Left CCA prox sys 132 cm/s   Left CCA prox dias 8 cm/s   Left CCA dist sys 93 cm/s   Left CCA dist dias 10 cm/s   Left ICA prox sys -111 cm/s   Left ICA prox dias -21 cm/s   Left ICA dist sys -140 cm/s   Left ICA dist dias -25 cm/s   RIGHT ECA DIAS -14 cm/s   RIGHT VERTEBRAL DIAS 7 cm/s   LEFT ECA DIAS -9 cm/s   LEFT VERTEBRAL DIAS 8 cm/s  Basic metabolic panel     Status: Abnormal   Collection Time: 10/06/15  7:47 AM  Result Value Ref Range   Sodium 137 135 - 145 mmol/L   Potassium 4.3 3.5 - 5.1 mmol/L   Chloride 106 101 - 111 mmol/L   CO2 26 22 - 32 mmol/L   Glucose, Bld 99 65 - 99 mg/dL   BUN 23 (H) 6 - 20 mg/dL   Creatinine, Ser 1.39 (H) 0.61 - 1.24 mg/dL   Calcium 9.2 8.9 - 10.3 mg/dL   GFR calc non Af Amer 50 (L) >60 mL/min   GFR calc Af Amer 58 (L) >60 mL/min    Comment: (NOTE) The eGFR has been calculated using the CKD EPI equation. This calculation has  not been validated in all clinical situations. eGFR's persistently <60 mL/min signify possible Chronic Kidney Disease.    Anion gap 5 5 - 15    Imaging: Ct Angio Head W Or Wo Contrast  10/06/2015  CLINICAL DATA:  Follow-up embolic infarct. EXAM: CT ANGIOGRAPHY HEAD AND NECK TECHNIQUE: Multidetector CT imaging of the head and neck was performed using the standard protocol during bolus administration of intravenous contrast. Multiplanar CT image reconstructions and MIPs were obtained to evaluate the vascular anatomy. Carotid stenosis measurements (when applicable) are obtained utilizing NASCET criteria, using the distal internal carotid diameter as the denominator. CONTRAST:  50 cc Isovue 370 history of hypertension hyperlipidemia. COMPARISON:  CT HEAD October 04, 2015 FINDINGS: CT HEAD INTRACRANIAL CONTENTS: Evolving RIGHT frontal parietal insular/operculum or wedge-like hypodensity without further propagation. No hemorrhagic conversion. Ventricles and sulci are overall normal for patient's age. No midline shift. No abnormal extra-axial fluid collections. Mild white matter changes most compatible with chronic small vessel ischemic disease are less than expected for age. ORBITS: The included ocular globes and orbital contents are non-suspicious. SINUSES: The mastoid aircells and included paranasal sinuses are well-aerated. SKULL/SOFT TISSUES: No skull fracture. No significant soft tissue swelling. CTA NECK AORTIC ARCH: Normal appearance of the thoracic arch, normal branch pattern. Mild calcific atherosclerosis aortic arch. The origins of the innominate, left Common carotid artery and subclavian artery are widely patent. RIGHT CAROTID SYSTEM: Common carotid artery is widely patent, coursing in a straight line fashion. Normal appearance of the carotid  bifurcation without hemodynamically significant stenosis by NASCET criteria. Mild eccentric calcific atherosclerosis. Normal appearance of the included internal  carotid artery. LEFT CAROTID SYSTEM: Common carotid artery is widely patent, coursing in a straight line fashion. Normal appearance of the carotid bifurcation without hemodynamically significant stenosis by NASCET criteria. Mild eccentric calcific atherosclerosis. Normal appearance of the included internal carotid artery. VERTEBRAL ARTERIES:RIGHT vertebral artery is dominant. Normal appearance of the vertebral arteries, which appear widely patent. SKELETON: No acute osseous process though bone windows have not been submitted. Mild degenerative change of the cervical spine. Old T1 and T2 spinous process fractures with nonunion. OTHER NECK: Soft tissues of the neck are non-acute though, not tailored for evaluation. Included apical heterogeneous lung attenuation most compatible with small airway disease associated bronchial wall thickening, centrilobular and paraseptal emphysema predominately on the RIGHT. CTA HEAD ANTERIOR CIRCULATION: Normal appearance of the cervical internal carotid arteries, petrous, cavernous and supra clinoid internal carotid arteries. Widely patent anterior communicating artery. Patent of the anterior and LEFT middle cerebral arteries. Distal RIGHT M2 occlusion, best seen on sagittal 83/250. No hemodynamically significant stenosis, dissection, contrast extravasation or aneurysm. Mild luminal irregularity of the middle and anterior cerebral arteries. POSTERIOR CIRCULATION: Normal appearance of the vertebral arteries, vertebrobasilar junction and basilar artery, as well as main branch vessels. Normal appearance of the posterior cerebral arteries. Robust RIGHT posterior communicating artery. No large vessel occlusion, hemodynamically significant stenosis, dissection, contrast extravasation or aneurysm. Mild luminal irregularity of posterior cerebral arteries. VENOUS SINUSES: Major dural venous sinuses are patent though not tailored for evaluation on this angiographic examination. ANATOMIC  VARIANTS: None. DELAYED PHASE: No abnormal intracranial enhancement. IMPRESSION: CTA HEAD: Evolving small to moderate RIGHT MCA territory infarct without hemorrhagic conversion. Otherwise negative CT HEAD for age. CTA NECK: Atherosclerosis without hemodynamically significant stenosis or acute vascular process. CTA HEAD: Distal RIGHT M2 occlusion corresponding to known late acute MCA territory infarct. Mild intracranial atherosclerosis. Electronically Signed   By: Elon Alas M.D.   On: 10/06/2015 03:10   Dg Chest 2 View  10/05/2015  CLINICAL DATA:  Cerebral infarction. EXAM: CHEST  2 VIEW COMPARISON:  11/25/2009 FINDINGS: The heart size and mediastinal contours are within normal limits. Stable parenchymal scarring bilaterally. There is no evidence of pulmonary edema, consolidation, pneumothorax, nodule or pleural fluid. Stable appearance of old left-sided rib fractures. IMPRESSION: No active cardiopulmonary disease. Electronically Signed   By: Aletta Edouard M.D.   On: 10/05/2015 07:52   Ct Angio Neck W Or Wo Contrast  10/06/2015  CLINICAL DATA:  Follow-up embolic infarct. EXAM: CT ANGIOGRAPHY HEAD AND NECK TECHNIQUE: Multidetector CT imaging of the head and neck was performed using the standard protocol during bolus administration of intravenous contrast. Multiplanar CT image reconstructions and MIPs were obtained to evaluate the vascular anatomy. Carotid stenosis measurements (when applicable) are obtained utilizing NASCET criteria, using the distal internal carotid diameter as the denominator. CONTRAST:  50 cc Isovue 370 history of hypertension hyperlipidemia. COMPARISON:  CT HEAD October 04, 2015 FINDINGS: CT HEAD INTRACRANIAL CONTENTS: Evolving RIGHT frontal parietal insular/operculum or wedge-like hypodensity without further propagation. No hemorrhagic conversion. Ventricles and sulci are overall normal for patient's age. No midline shift. No abnormal extra-axial fluid collections. Mild white matter  changes most compatible with chronic small vessel ischemic disease are less than expected for age. ORBITS: The included ocular globes and orbital contents are non-suspicious. SINUSES: The mastoid aircells and included paranasal sinuses are well-aerated. SKULL/SOFT TISSUES: No skull fracture. No significant soft tissue swelling. CTA NECK  AORTIC ARCH: Normal appearance of the thoracic arch, normal branch pattern. Mild calcific atherosclerosis aortic arch. The origins of the innominate, left Common carotid artery and subclavian artery are widely patent. RIGHT CAROTID SYSTEM: Common carotid artery is widely patent, coursing in a straight line fashion. Normal appearance of the carotid bifurcation without hemodynamically significant stenosis by NASCET criteria. Mild eccentric calcific atherosclerosis. Normal appearance of the included internal carotid artery. LEFT CAROTID SYSTEM: Common carotid artery is widely patent, coursing in a straight line fashion. Normal appearance of the carotid bifurcation without hemodynamically significant stenosis by NASCET criteria. Mild eccentric calcific atherosclerosis. Normal appearance of the included internal carotid artery. VERTEBRAL ARTERIES:RIGHT vertebral artery is dominant. Normal appearance of the vertebral arteries, which appear widely patent. SKELETON: No acute osseous process though bone windows have not been submitted. Mild degenerative change of the cervical spine. Old T1 and T2 spinous process fractures with nonunion. OTHER NECK: Soft tissues of the neck are non-acute though, not tailored for evaluation. Included apical heterogeneous lung attenuation most compatible with small airway disease associated bronchial wall thickening, centrilobular and paraseptal emphysema predominately on the RIGHT. CTA HEAD ANTERIOR CIRCULATION: Normal appearance of the cervical internal carotid arteries, petrous, cavernous and supra clinoid internal carotid arteries. Widely patent anterior  communicating artery. Patent of the anterior and LEFT middle cerebral arteries. Distal RIGHT M2 occlusion, best seen on sagittal 83/250. No hemodynamically significant stenosis, dissection, contrast extravasation or aneurysm. Mild luminal irregularity of the middle and anterior cerebral arteries. POSTERIOR CIRCULATION: Normal appearance of the vertebral arteries, vertebrobasilar junction and basilar artery, as well as main branch vessels. Normal appearance of the posterior cerebral arteries. Robust RIGHT posterior communicating artery. No large vessel occlusion, hemodynamically significant stenosis, dissection, contrast extravasation or aneurysm. Mild luminal irregularity of posterior cerebral arteries. VENOUS SINUSES: Major dural venous sinuses are patent though not tailored for evaluation on this angiographic examination. ANATOMIC VARIANTS: None. DELAYED PHASE: No abnormal intracranial enhancement. IMPRESSION: CTA HEAD: Evolving small to moderate RIGHT MCA territory infarct without hemorrhagic conversion. Otherwise negative CT HEAD for age. CTA NECK: Atherosclerosis without hemodynamically significant stenosis or acute vascular process. CTA HEAD: Distal RIGHT M2 occlusion corresponding to known late acute MCA territory infarct. Mild intracranial atherosclerosis. Electronically Signed   By: Elon Alas M.D.   On: 10/06/2015 03:10   Ct Head Code Stroke W/o Cm  10/04/2015  CLINICAL DATA:  Left-sided facial droop and dysarthria. Left arm numbness. Symptoms began 20 hours ago approximately. Worsening of symptoms during the day. EXAM: CT HEAD WITHOUT CONTRAST TECHNIQUE: Contiguous axial images were obtained from the base of the skull through the vertex without intravenous contrast. COMPARISON:  06/19/2007 FINDINGS: The brainstem and cerebellum are normal. The left cerebral hemispheres normal except for mild chronic small-vessel change of the deep white matter. Right hemisphere shows low density at the  frontoparietal junction region measuring about 3-4 cm in size consistent with late acute infarction. Basal ganglia are spared. Insular brain is spared. No evidence of mass effect or hemorrhage. No hydrocephalus or extra-axial collection. I would consider this to a ball the M2, M3 and M 6 regions in therefore would assigned this aspects 7. IMPRESSION: Late acute infarction in the right MCA territory affecting M2, M3 and M6 regions consistent with aspects score 7. No mass effect or hemorrhage. Electronically Signed   By: Nelson Chimes M.D.   On: 10/04/2015 18:59      Results/Tests Pending at Time of Discharge: None  Discharge Medications:    Medication List  TAKE these medications        allopurinol 300 MG tablet  Commonly known as:  ZYLOPRIM  Take 300 mg by mouth daily.     aspirin 325 MG tablet  Take 1 tablet (325 mg total) by mouth daily.     atenolol 50 MG tablet  Commonly known as:  TENORMIN  Take 50 mg by mouth daily.     fenofibrate 160 MG tablet  Take 160 mg by mouth daily.     ibuprofen 200 MG tablet  Commonly known as:  ADVIL,MOTRIN  Take 400 mg by mouth every 6 (six) hours as needed for moderate pain.     lisinopril 40 MG tablet  Commonly known as:  PRINIVIL,ZESTRIL  Take 40 mg by mouth daily.     morphine 15 MG 12 hr tablet  Commonly known as:  MS CONTIN  Take 15 mg by mouth 2 (two) times daily.     oxyCODONE-acetaminophen 5-325 MG tablet  Commonly known as:  PERCOCET/ROXICET  Take 1 tablet by mouth every 8 (eight) hours as needed for severe pain.        Discharge Instructions: Please refer to Patient Instructions section of EMR for full details.  Patient was counseled important signs and symptoms that should prompt return to medical care, changes in medications, dietary instructions, activity restrictions, and follow up appointments.   Follow-Up Appointments:     Follow-up Information    Follow up with [facility] On 10/21/2015.    Specialty:  Cardiology   Why:  at 11AM for wound check    Contact information:   [address], [place] [phone]      Follow up with Antony Contras, MD In 2 months.   Specialties:  Neurology, Radiology   Why:  Stroke Clinic, Office will call you with appointment date & time   Contact information:   [address] [place] [address] [phone]       Marjie Skiff, MD 10/08/2015, 5:18 PM PGY-1, [place]

## 2015-10-08 NOTE — Discharge Instructions (Signed)
Transesophageal Echocardiogram °Transesophageal echocardiography (TEE) is a special type of test that produces images of the heart by using sound waves (echocardiogram). This type of echocardiography can obtain better images of the heart than standard echocardiography. TEE is done by passing a flexible tube down the esophagus. The heart is located in front of the esophagus. Because the heart and esophagus are close to one another, your health care provider can take very clear, detailed pictures of the heart via ultrasound waves. °TEE may be done: °· If your health care provider needs more information based on standard echocardiography findings. °· If you had a stroke. This might have happened because a clot formed in your heart. TEE can visualize different areas of the heart and check for clots. °· To check valve anatomy and function. °· To check for infection on the inside of your heart (endocarditis). °· To evaluate the dividing wall (septum) of the heart and presence of a hole that did not close after birth (patent foramen ovale or atrial septal defect). °· To help diagnose a tear in the wall of the aorta (aortic dissection). °· During cardiac valve surgery. This allows the surgeon to assess the valve repair before closing the chest. °· During a variety of other cardiac procedures to guide positioning of catheters. °· Sometimes before a cardioversion, which is a shock to convert heart rhythm back to normal. °LET YOUR HEALTH CARE PROVIDER KNOW ABOUT:  °· Any allergies you have. °· All medicines you are taking, including vitamins, herbs, eye drops, creams, and over-the-counter medicines. °· Previous problems you or members of your family have had with the use of anesthetics. °· Any blood disorders you have. °· Previous surgeries you have had. °· Medical conditions you have. °· Swallowing difficulties. °· An esophageal obstruction. °RISKS AND COMPLICATIONS  °Generally, TEE is a safe procedure. However, as with any  procedure, complications can occur. Possible complications include an esophageal tear (rupture). °BEFORE THE PROCEDURE  °· Do not eat or drink for 6 hours before the procedure or as directed by your health care provider. °· Arrange for someone to drive you home after the procedure. Do not drive yourself home. During the procedure, you will be given medicines that can continue to make you feel drowsy and can impair your reflexes. °· An IV access tube will be started in the arm. °PROCEDURE  °· A medicine to help you relax (sedative) will be given through the IV access tube. °· A medicine may be sprayed or gargled to numb the back of the throat. °· Your blood pressure, heart rate, and breathing (vital signs) will be monitored during the procedure. °· The TEE probe is a long, flexible tube. The tip of the probe is placed into the back of the mouth, and you will be asked to swallow. This helps to pass the tip of the probe into the esophagus. Once the tip of the probe is in the correct area, your health care provider can take pictures of the heart. °· TEE is usually not a painful procedure. You may feel the probe press against the back of the throat. The probe does not enter the trachea and does not affect your breathing. °AFTER THE PROCEDURE  °· You will be in bed, resting, until you have fully returned to consciousness. °· When you first awaken, your throat may feel slightly sore and will probably still feel numb. This will improve slowly over time. °· You will not be allowed to eat or drink until it   is clear that the numbness has improved.  Once you have been able to drink, urinate, and sit on the edge of the bed without feeling sick to your stomach (nausea) or dizzy, you may be cleared to go home.  You should have a friend or family member with you for the next 24 hours after your procedure.   This information is not intended to replace advice given to you by your health care provider. Make sure you discuss any  questions you have with your health care provider.   Document Released: 06/04/2002 Document Revised: 03/19/2013 Document Reviewed: 09/13/2012 Elsevier Interactive Patient Education 2016 [place] Monitored anesthesia care is an anesthesia service for a medical procedure. Anesthesia is the loss of the ability to feel pain. It is produced by medicines called anesthetics. It may affect a small area of your body (local anesthesia), a large area of your body (regional anesthesia), or your entire body (general anesthesia). The need for monitored anesthesia care depends your procedure, your condition, and the potential need for regional or general anesthesia. It is often provided during procedures where:   General anesthesia may be needed if there are complications. This is because you need special care when you are under general anesthesia.   You will be under local or regional anesthesia. This is so that you are able to have higher levels of anesthesia if needed.   You will receive calming medicines (sedatives). This is especially the case if sedatives are given to put you in a semi-conscious state of relaxation (deep sedation). This is because the amount of sedative needed to produce this state can be hard to predict. Too much of a sedative can produce general anesthesia. Monitored anesthesia care is performed by one or more health care providers who have special training in all types of anesthesia. You will need to meet with these health care providers before your procedure. During this meeting, they will ask you about your medical history. They will also give you instructions to follow. (For example, you will need to stop eating and drinking before your procedure. You may also need to stop or change medicines you are taking.) During your procedure, your health care providers will stay with you. They will:   Watch your condition. This includes watching your blood pressure,  breathing, and level of pain.   Diagnose and treat problems that occur.   Give medicines if they are needed. These may include calming medicines (sedatives) and anesthetics.   Make sure you are comfortable.  Having monitored anesthesia care does not necessarily mean that you will be under anesthesia. It does mean that your health care providers will be able to manage anesthesia if you need it or if it occurs. It also means that you will be able to have a different type of anesthesia than you are having if you need it. When your procedure is complete, your health care providers will continue to watch your condition. They will make sure any medicines wear off before you are allowed to go home.    This information is not intended to replace advice given to you by your health care provider. Make sure you discuss any questions you have with your health care provider.   Document Released: 12/08/2004 Document Revised: 04/04/2014 Document Reviewed: 04/25/2012 Elsevier Interactive Patient Education [employer].

## 2015-10-21 ENCOUNTER — Ambulatory Visit (INDEPENDENT_AMBULATORY_CARE_PROVIDER_SITE_OTHER): Payer: Medicare Other | Admitting: *Deleted

## 2015-10-21 DIAGNOSIS — Z95818 Presence of other cardiac implants and grafts: Secondary | ICD-10-CM

## 2015-10-21 LAB — CUP PACEART INCLINIC DEVICE CHECK: Date Time Interrogation Session: 20170726112046

## 2015-10-21 NOTE — Progress Notes (Signed)
Loop wound check in clinic. Steri strips removed, incision edges approximated. No redness, swelling, drainage. Patient educated about incision care and remote monitoring. Battery status: good. R-waves 0.55mV. No episodes. Monthly summary reports and ROV with JA PRN.

## 2015-10-26 ENCOUNTER — Encounter: Payer: Self-pay | Admitting: Internal Medicine

## 2015-11-05 ENCOUNTER — Ambulatory Visit (INDEPENDENT_AMBULATORY_CARE_PROVIDER_SITE_OTHER): Payer: Medicare Other | Admitting: *Deleted

## 2015-11-05 DIAGNOSIS — Z95818 Presence of other cardiac implants and grafts: Secondary | ICD-10-CM | POA: Diagnosis not present

## 2015-11-09 NOTE — Progress Notes (Signed)
Carelink Summary Report / Loop Recorder 

## 2015-11-10 DIAGNOSIS — H25813 Combined forms of age-related cataract, bilateral: Secondary | ICD-10-CM | POA: Diagnosis not present

## 2015-11-10 DIAGNOSIS — H524 Presbyopia: Secondary | ICD-10-CM | POA: Diagnosis not present

## 2015-11-22 ENCOUNTER — Telehealth: Payer: Self-pay | Admitting: Diagnostic Neuroimaging

## 2015-11-22 DIAGNOSIS — M79642 Pain in left hand: Secondary | ICD-10-CM | POA: Diagnosis not present

## 2015-11-22 DIAGNOSIS — M25549 Pain in joints of unspecified hand: Secondary | ICD-10-CM | POA: Diagnosis not present

## 2015-11-22 DIAGNOSIS — M109 Gout, unspecified: Secondary | ICD-10-CM | POA: Diagnosis not present

## 2015-11-22 NOTE — Telephone Encounter (Signed)
Pt wife called in. Yesterday started with left hand swelling and "knot" on the knuckle, in the arm affected by the stroke in July 2017. Went to urgent care and dx'd with gout vs arthritis. Advised them to follow up with PCP.    Penni Bombard, MD [id number], [id number] PM Certified in Neurology, Neurophysiology and Neuroimaging  [facility] [address] [place], [place] [address] [phone]

## 2015-11-23 NOTE — Telephone Encounter (Signed)
Thanks

## 2015-11-24 DIAGNOSIS — M79642 Pain in left hand: Secondary | ICD-10-CM | POA: Diagnosis not present

## 2015-11-26 LAB — CUP PACEART REMOTE DEVICE CHECK: MDC IDC SESS DTM: 20170810183708

## 2015-11-26 NOTE — Progress Notes (Signed)
Carelink summary report received. Battery status OK. Normal device function. No new symptom episodes, tachy episodes, brady, or pause episodes. No new AF episodes. Monthly summary reports and ROV/PRN 

## 2015-12-07 ENCOUNTER — Ambulatory Visit (INDEPENDENT_AMBULATORY_CARE_PROVIDER_SITE_OTHER): Payer: Medicare Other | Admitting: *Deleted

## 2015-12-07 DIAGNOSIS — Z95818 Presence of other cardiac implants and grafts: Secondary | ICD-10-CM

## 2015-12-07 NOTE — Progress Notes (Signed)
Carelink Summary Report / Loop Recorder 

## 2015-12-09 ENCOUNTER — Encounter: Payer: Self-pay | Admitting: Neurology

## 2015-12-09 ENCOUNTER — Ambulatory Visit (INDEPENDENT_AMBULATORY_CARE_PROVIDER_SITE_OTHER): Payer: Medicare Other | Admitting: Neurology

## 2015-12-09 VITALS — BP 110/50 | HR 64 | Ht 69.0 in | Wt 171.8 lb

## 2015-12-09 DIAGNOSIS — I639 Cerebral infarction, unspecified: Secondary | ICD-10-CM

## 2015-12-09 NOTE — Progress Notes (Signed)
[facility] [address] [place]. [place] [address] [phone]       OFFICE FOLLOW-UP NOTE  Bobby. Bobby Nguyen Date of Birth:  May 06, 1945 Medical Record Number:  [id number]   HPI: Bobby Nguyen is a 52 year Caucasian male who is seen today for first office follow-up visit for hospital admission in July 2017 with stroke. He is accompanied by his wife and daughter.Bobby Nguyen is a 70 y.o. male who awoke this morning 10/04/2015 (LKW 10/03/2015, time unknown) with numbness. He states that he may have had some trouble with blurred vision yesterday at some time, but is not certain. What is certain is that when he awoke this morning his entire left side was numb. He was also slurring his speech and had some difficulty walking. Due to this persisting, he came into the emergency room where a CT was performed which shows a right MCA branch distribution infarct. Patient was not administered IV t-PA secondary to being out of the window. He was admitted for further evaluation and treatment. MRI scan of the brain could not be obtained since patient had metallic plate, screws and dental implants. CT scan showed right MCA infarcts both old and new. Carotid Doppler showed no significant extracranial stenosis. Transthoracic echo showed normal ejection fraction without cardiac source of embolism. Transcranial Doppler showed evidence of diffuse intracranial atherosclerosis. LDL cholesterol was 84 g percent. Hemoglobin A1c was 5.4. CT angiogram of the brain showed right frontoparietal insular/opercular wedge-shaped hypodensity consistent with an acute infarct. CT brain showed distal right M2 occlusion and CTA neck showed no significant extracranial stenosis. Patient was seen by physical outpatient therapy and felt to be stable enough to discharge him. He states is doing well and his regained most of his strength back on the left but his left hand and fine motor skills. Diminished. He in fact was seen in  urgent care for pain and swelling on the dorsal aspect of his left hand 10 days ago due to cellulitis and gout. He was treated with a course of steroids and antibiotics with good improvement. Is currently at home and independent in all activities of daily living. He has slight difficulty with buttoning his shirt and fine motor skills involving his hand. He starting aspirin well without bleeding but does have minor bruising. He states his blood pressure is well controlled and today it is 110/50. He is still smoking but is ready to quit. ROS:   14 system review of systems is positive for  appetite change activity change, X excessive eating, constipation, easy bruising, numbness, joint swelling, back pain, daytime sleepiness, and weakness and all other systems negative  PMH:  Past Medical History:  Diagnosis Date  . Back pain   . Gout   . Hyperlipidemia   . Hypertension   . Stroke [facility])     Social History:  Social History   Social History  . Marital status: Married    Spouse name: N/A  . Number of children: N/A  . Years of education: N/A   Occupational History  . Not on file.   Social History Main Topics  . Smoking status: Current Every Day Smoker    Packs/day: 2.00    Types: Cigarettes  . Smokeless tobacco: Never Used  . Alcohol use 2.4 oz/week    4 Cans of beer per week  . Drug use: No  . Sexual activity: Not on file   Other Topics Concern  . Not on file   Social History  Narrative  . No narrative on file    Medications:   Current Outpatient Prescriptions on File Prior to Visit  Medication Sig Dispense Refill  . allopurinol (ZYLOPRIM) 300 MG tablet Take 300 mg by mouth daily.    Marland Kitchen aspirin 325 MG tablet Take 1 tablet (325 mg total) by mouth daily. 30 tablet 0  . atenolol (TENORMIN) 50 MG tablet Take 50 mg by mouth daily.    . fenofibrate 160 MG tablet Take 160 mg by mouth daily.    Marland Kitchen ibuprofen (ADVIL,MOTRIN) 200 MG tablet Take 400 mg by mouth every 6 (six) hours as  needed for moderate pain.    Marland Kitchen lisinopril (PRINIVIL,ZESTRIL) 40 MG tablet Take 40 mg by mouth daily.    Marland Kitchen morphine (MS CONTIN) 15 MG 12 hr tablet Take 15 mg by mouth 2 (two) times daily.  0  . oxyCODONE-acetaminophen (PERCOCET/ROXICET) 5-325 MG tablet Take 1 tablet by mouth every 8 (eight) hours as needed for severe pain.     No current facility-administered medications on file prior to visit.     Allergies:   Allergies  Allergen Reactions  . Penicillins Itching, Swelling and Rash    Physical Exam General: well developed, well nourished middle-age Caucasian male, seated, in no evident distress Head: head normocephalic and atraumatic.  Neck: supple with no carotid or supraclavicular bruits Cardiovascular: regular rate and rhythm, no murmurs Musculoskeletal: no deformity Skin:  no rash/petichiae Vascular:  Normal pulses all extremities Vitals:   12/09/15 1043  BP: (!) 110/50  Pulse: 64   Neurologic Exam Mental Status: Awake and fully alert. Oriented to place and time. Recent and remote memory intact. Attention span, concentration and fund of knowledge appropriate. Mood and affect appropriate.  Cranial Nerves: Fundoscopic exam reveals sharp disc margins. Pupils equal, briskly reactive to light. Extraocular movements full without nystagmus. Visual fields full to confrontation. Hearing intact. Facial sensation intact. Face, tongue, palate moves normally and symmetrically.  Motor: Normal bulk and tone. Normal strength in all tested extremity muscles.Mild weakness of left grip and intrinsic hand muscles. Orbits right over left upper extremity. Mild weakness of left hip flexor and ankle dorsiflexors also. Sensory.: intact to touch ,pinprick .position and vibratory sensation.  Coordination: Rapid alternating movements normal in all extremities. Finger-to-nose and heel-to-shin performed accurately bilaterally. Gait and Station: Arises from chair without difficulty. Stance is normal. Gait  demonstrates normal stride length and balance . Unable to heel, toe and tandem walk without difficulty.  Reflexes: 1+ and symmetric. Toes downgoing.   NIHSS  0 Modified Rankin  2   ASSESSMENT: 79 year Occasion male with right MCA branch infarct of cryptogenic etiology in July 2017 with vascular risk factors of hypertension. His drinking quite well with only mild hand weakness    PLAN: I had a long d/w patient, wife and daughter about his recent  Cryptogenic stroke, risk for recurrent stroke/TIAs, personally independently reviewed imaging studies and stroke evaluation results and answered questions.Continue aspirin 325 mg daily  for secondary stroke prevention and maintain strict control of hypertension with blood pressure goal below 130/90, diabetes with hemoglobin A1c goal below 6.5% and lipids with LDL cholesterol goal below 70 mg/dL. I also advised the patient to eat a healthy diet with plenty of whole grains, cereals, fruits and vegetables, exercise regularly and maintain ideal body weight. I have also counseled him to quit smoking completely. He was advised to seek help from primary care physician for this Consider possible participation in the [place] trial if  interested. Followup in the future with me in 6 months or call earlier if necessary Greater than 50% of time during this 25 minute visit was spent on counseling,explanation of diagnosis, planning of further management, discussion with patient and family and coordination of care Antony Contras, MD  [facility] [address] [place], [place] [address]  Phone [phone] Fax [phone] Note: This document was prepared with digital dictation and possible smart phrase technology. Any transcriptional errors that result from this process are unintentional

## 2015-12-09 NOTE — Patient Instructions (Signed)
I had a long d/w patient, wife and daughter about his recent  Cryptogenic stroke, risk for recurrent stroke/TIAs, personally independently reviewed imaging studies and stroke evaluation results and answered questions.Continue aspirin 325 mg daily  for secondary stroke prevention and maintain strict control of hypertension with blood pressure goal below 130/90, diabetes with hemoglobin A1c goal below 6.5% and lipids with LDL cholesterol goal below 70 mg/dL. I also advised the patient to eat a healthy diet with plenty of whole grains, cereals, fruits and vegetables, exercise regularly and maintain ideal body weight. Consider possible participation in the [place] trial if interested. Followup in the future with me in 6 months or call earlier if necessary  Stroke Prevention Some medical conditions and behaviors are associated with an increased chance of having a stroke. You may prevent a stroke by making healthy choices and managing medical conditions. HOW CAN I REDUCE MY RISK OF HAVING A STROKE?   Stay physically active. Get at least 30 minutes of activity on most or all days.  Do not smoke. It may also be helpful to avoid exposure to secondhand smoke.  Limit alcohol use. Moderate alcohol use is considered to be:  No more than 2 drinks per day for men.  No more than 1 drink per day for nonpregnant women.  Eat healthy foods. This involves:  Eating 5 or more servings of fruits and vegetables a day.  Making dietary changes that address high blood pressure (hypertension), high cholesterol, diabetes, or obesity.  Manage your cholesterol levels.  Making food choices that are high in fiber and low in saturated fat, trans fat, and cholesterol may control cholesterol levels.  Take any prescribed medicines to control cholesterol as directed by your health care provider.  Manage your diabetes.  Controlling your carbohydrate and sugar intake is recommended to manage diabetes.  Take any  prescribed medicines to control diabetes as directed by your health care provider.  Control your hypertension.  Making food choices that are low in salt (sodium), saturated fat, trans fat, and cholesterol is recommended to manage hypertension.  Ask your health care provider if you need treatment to lower your blood pressure. Take any prescribed medicines to control hypertension as directed by your health care provider.  If you are 45-27 years of age, have your blood pressure checked every 3-5 years. If you are 31 years of age or older, have your blood pressure checked every year.  Maintain a healthy weight.  Reducing calorie intake and making food choices that are low in sodium, saturated fat, trans fat, and cholesterol are recommended to manage weight.  Stop drug abuse.  Avoid taking birth control pills.  Talk to your health care provider about the risks of taking birth control pills if you are over 36 years old, smoke, get migraines, or have ever had a blood clot.  Get evaluated for sleep disorders (sleep apnea).  Talk to your health care provider about getting a sleep evaluation if you snore a lot or have excessive sleepiness.  Take medicines only as directed by your health care provider.  For some people, aspirin or blood thinners (anticoagulants) are helpful in reducing the risk of forming abnormal blood clots that can lead to stroke. If you have the irregular heart rhythm of atrial fibrillation, you should be on a blood thinner unless there is a good reason you cannot take them.  Understand all your medicine instructions.  Make sure that other conditions (such as anemia or atherosclerosis) are addressed. SEEK IMMEDIATE  MEDICAL CARE IF:   You have sudden weakness or numbness of the face, arm, or leg, especially on one side of the body.  Your face or eyelid droops to one side.  You have sudden confusion.  You have trouble speaking (aphasia) or understanding.  You have  sudden trouble seeing in one or both eyes.  You have sudden trouble walking.  You have dizziness.  You have a loss of balance or coordination.  You have a sudden, severe headache with no known cause.  You have new chest pain or an irregular heartbeat. Any of these symptoms may represent a serious problem that is an emergency. Do not wait to see if the symptoms will go away. Get medical help at once. Call your local emergency services (911 in [place] Do not drive yourself to the hospital.   This information is not intended to replace advice given to you by your health care provider. Make sure you discuss any questions you have with your health care provider.   Document Released: 04/21/2004 Document Revised: 04/04/2014 Document Reviewed: 09/14/2012 Elsevier Interactive Patient Education [employer].

## 2016-01-02 LAB — CUP PACEART REMOTE DEVICE CHECK: Date Time Interrogation Session: 20170909190653

## 2016-01-02 NOTE — Progress Notes (Signed)
Carelink summary report received. Battery status OK. Normal device function. No new symptom episodes, tachy episodes, brady, or pause episodes. No new AF episodes. Monthly summary reports and ROV/PRN 

## 2016-01-04 ENCOUNTER — Ambulatory Visit (INDEPENDENT_AMBULATORY_CARE_PROVIDER_SITE_OTHER): Payer: Medicare Other | Admitting: *Deleted

## 2016-01-04 DIAGNOSIS — Z95818 Presence of other cardiac implants and grafts: Secondary | ICD-10-CM

## 2016-01-05 NOTE — Progress Notes (Signed)
Carelink Summary Report / Loop Recorder 

## 2016-01-06 DIAGNOSIS — E782 Mixed hyperlipidemia: Secondary | ICD-10-CM | POA: Diagnosis not present

## 2016-01-06 DIAGNOSIS — Z79899 Other long term (current) drug therapy: Secondary | ICD-10-CM | POA: Diagnosis not present

## 2016-01-06 DIAGNOSIS — M542 Cervicalgia: Secondary | ICD-10-CM | POA: Diagnosis not present

## 2016-01-06 DIAGNOSIS — I69359 Hemiplegia and hemiparesis following cerebral infarction affecting unspecified side: Secondary | ICD-10-CM | POA: Diagnosis not present

## 2016-01-06 DIAGNOSIS — I10 Essential (primary) hypertension: Secondary | ICD-10-CM | POA: Diagnosis not present

## 2016-01-13 DIAGNOSIS — E875 Hyperkalemia: Secondary | ICD-10-CM | POA: Diagnosis not present

## 2016-02-03 ENCOUNTER — Ambulatory Visit (INDEPENDENT_AMBULATORY_CARE_PROVIDER_SITE_OTHER): Payer: Medicare Other | Admitting: *Deleted

## 2016-02-03 DIAGNOSIS — Z95818 Presence of other cardiac implants and grafts: Secondary | ICD-10-CM

## 2016-02-04 NOTE — Progress Notes (Signed)
Carelink Summary Report / Loop Recorder 

## 2016-02-07 LAB — CUP PACEART REMOTE DEVICE CHECK
Implantable Pulse Generator Implant Date: 20170711
MDC IDC SESS DTM: 20171009203852

## 2016-02-07 NOTE — Progress Notes (Signed)
Carelink summary report received. Battery status OK. Normal device function. No new symptom episodes, tachy episodes, brady, or pause episodes. No new AF episodes. Monthly summary reports and ROV/PRN 

## 2016-03-04 ENCOUNTER — Ambulatory Visit (INDEPENDENT_AMBULATORY_CARE_PROVIDER_SITE_OTHER): Payer: Medicare Other | Admitting: *Deleted

## 2016-03-04 DIAGNOSIS — Z95818 Presence of other cardiac implants and grafts: Secondary | ICD-10-CM | POA: Diagnosis not present

## 2016-03-07 NOTE — Progress Notes (Signed)
Carelink Summary Report / Loop Recorder 

## 2016-03-19 LAB — CUP PACEART REMOTE DEVICE CHECK
MDC IDC PG IMPLANT DT: 20170711
MDC IDC SESS DTM: 20171108213936

## 2016-03-19 NOTE — Progress Notes (Signed)
Carelink summary report received. Battery status OK. Normal device function. No new symptom episodes, tachy episodes, brady, or pause episodes. No new AF episodes. Monthly summary reports and ROV/PRN 

## 2016-04-04 ENCOUNTER — Ambulatory Visit (INDEPENDENT_AMBULATORY_CARE_PROVIDER_SITE_OTHER): Payer: Medicare Other | Admitting: *Deleted

## 2016-04-04 DIAGNOSIS — Z95818 Presence of other cardiac implants and grafts: Secondary | ICD-10-CM | POA: Diagnosis not present

## 2016-04-05 NOTE — Progress Notes (Signed)
Carelink Summary Report / Loop Recorder 

## 2016-04-23 LAB — CUP PACEART REMOTE DEVICE CHECK
Implantable Pulse Generator Implant Date: 20170711
MDC IDC SESS DTM: 20171208214026

## 2016-04-23 NOTE — Progress Notes (Signed)
Carelink summary report received. Battery status OK. Normal device function. No new symptom episodes, tachy episodes, brady, or pause episodes. No new AF episodes. Monthly summary reports and ROV/PRN 

## 2016-05-03 ENCOUNTER — Ambulatory Visit (INDEPENDENT_AMBULATORY_CARE_PROVIDER_SITE_OTHER): Payer: Medicare Other | Admitting: *Deleted

## 2016-05-03 DIAGNOSIS — Z95818 Presence of other cardiac implants and grafts: Secondary | ICD-10-CM

## 2016-05-04 NOTE — Progress Notes (Signed)
Carelink Summary Report / Loop Recorder 

## 2016-05-17 LAB — CUP PACEART REMOTE DEVICE CHECK
Date Time Interrogation Session: 20180107224002
MDC IDC PG IMPLANT DT: 20170711

## 2016-05-26 LAB — CUP PACEART REMOTE DEVICE CHECK
Implantable Pulse Generator Implant Date: 20170711
MDC IDC SESS DTM: 20180206224321

## 2016-05-26 NOTE — Progress Notes (Signed)
Carelink summary report received. Battery status OK. Normal device function. No new symptom episodes, tachy episodes, brady, or pause episodes. No new AF episodes. Monthly summary reports and ROV/PRN 

## 2016-06-02 ENCOUNTER — Ambulatory Visit (INDEPENDENT_AMBULATORY_CARE_PROVIDER_SITE_OTHER): Payer: Medicare Other | Admitting: *Deleted

## 2016-06-02 DIAGNOSIS — I639 Cerebral infarction, unspecified: Secondary | ICD-10-CM

## 2016-06-03 NOTE — Progress Notes (Signed)
Carelink Summary Report / Loop Recorder 

## 2016-06-07 ENCOUNTER — Ambulatory Visit: Payer: Medicare Other | Admitting: Neurology

## 2016-06-09 ENCOUNTER — Ambulatory Visit (INDEPENDENT_AMBULATORY_CARE_PROVIDER_SITE_OTHER): Payer: Medicare Other | Admitting: Neurology

## 2016-06-09 ENCOUNTER — Telehealth: Payer: Self-pay | Admitting: Neurology

## 2016-06-09 ENCOUNTER — Encounter: Payer: Self-pay | Admitting: Neurology

## 2016-06-09 VITALS — BP 109/56 | HR 66 | Wt 176.6 lb

## 2016-06-09 DIAGNOSIS — I639 Cerebral infarction, unspecified: Secondary | ICD-10-CM | POA: Diagnosis not present

## 2016-06-09 NOTE — Progress Notes (Signed)
[facility] [address] [place]. [place] [address] [phone]       OFFICE FOLLOW-UP NOTE  Bobby. Bobby Nguyen Date of Birth:  06-03-1945 Medical Record Number:  [id number]   HPI:  Initial visit 12/09/15 :Bobby Nguyen is a 78 year Caucasian male who is seen today for first office follow-up visit for hospital admission in July 2017 with stroke. He is accompanied by his wife and daughter.Bobby Nguyen is a 71 y.o. male who awoke this morning 10/04/2015 (LKW 10/03/2015, time unknown) with numbness. He states that he may have had some trouble with blurred vision yesterday at some time, but is not certain. What is certain is that when he awoke this morning his entire left side was numb. He was also slurring his speech and had some difficulty walking. Due to this persisting, he came into the emergency room where a CT was performed which shows a right MCA branch distribution infarct. Patient was not administered IV t-PA secondary to being out of the window. He was admitted for further evaluation and treatment. MRI scan of the brain could not be obtained since patient had metallic plate, screws and dental implants. CT scan showed right MCA infarcts both old and new. Carotid Doppler showed no significant extracranial stenosis. Transthoracic echo showed normal ejection fraction without cardiac source of embolism. Transcranial Doppler showed evidence of diffuse intracranial atherosclerosis. LDL cholesterol was 84 g percent. Hemoglobin A1c was 5.4. CT angiogram of the brain showed right frontoparietal insular/opercular wedge-shaped hypodensity consistent with an acute infarct. CT brain showed distal right M2 occlusion and CTA neck showed no significant extracranial stenosis. Patient was seen by physical outpatient therapy and felt to be stable enough to discharge him. He states is doing well and his regained most of his strength back on the left but his left hand and fine motor skills. Diminished. He  in fact was seen in urgent care for pain and swelling on the dorsal aspect of his left hand 10 days ago due to cellulitis and gout. He was treated with a course of steroids and antibiotics with good improvement. Is currently at home and independent in all activities of daily living. He has slight difficulty with buttoning his shirt and fine motor skills involving his hand. He starting aspirin well without bleeding but does have minor bruising. He states his blood pressure is well controlled and today it is 110/50. He is still smoking but is ready to quit. Update 06/09/2016 : He returns for follow-up after last visit 6 months ago. Is accompanied by his wife. He remains on aspirin and is tolerating well without bleeding but does get bruised easily. His blood pressure is well controlled and today it is 109/56. His tolerating fenofibrate well without side effects and had lipid profile checked a month ago which was satisfactory but I do not have the report. He has not yet been found to have paroxysmal A. fib and loop recorder. Patient continues to smoke but states has back and is trying to quit. He has no other new complaints today. ROS:   14 system review of systems is positive for  no complaints today and all other systems negative  PMH:  Past Medical History:  Diagnosis Date  . Back pain   . Gout   . Hyperlipidemia   . Hypertension   . Stroke [facility])     Social History:  Social History   Social History  . Marital status: Married    Spouse name: N/A  .  Number of children: N/A  . Years of education: N/A   Occupational History  . Not on file.   Social History Main Topics  . Smoking status: Current Every Day Smoker    Packs/day: 2.00    Types: Cigarettes  . Smokeless tobacco: Never Used  . Alcohol use 2.4 oz/week    4 Cans of beer per week  . Drug use: No  . Sexual activity: Not on file   Other Topics Concern  . Not on file   Social History Narrative  . No narrative on file     Medications:   Current Outpatient Prescriptions on File Prior to Visit  Medication Sig Dispense Refill  . allopurinol (ZYLOPRIM) 300 MG tablet Take 300 mg by mouth daily.    Marland Kitchen aspirin 325 MG tablet Take 1 tablet (325 mg total) by mouth daily. 30 tablet 0  . atenolol (TENORMIN) 50 MG tablet Take 50 mg by mouth daily.    . fenofibrate 160 MG tablet Take 160 mg by mouth daily.    Marland Kitchen ibuprofen (ADVIL,MOTRIN) 200 MG tablet Take 400 mg by mouth every 6 (six) hours as needed for moderate pain.    Marland Kitchen lisinopril (PRINIVIL,ZESTRIL) 40 MG tablet Take 40 mg by mouth daily.    Marland Kitchen morphine (MS CONTIN) 15 MG 12 hr tablet Take 15 mg by mouth 2 (two) times daily.  0  . oxyCODONE-acetaminophen (PERCOCET/ROXICET) 5-325 MG tablet Take 1 tablet by mouth every 8 (eight) hours as needed for severe pain.     No current facility-administered medications on file prior to visit.     Allergies:   Allergies  Allergen Reactions  . Penicillins Itching, Swelling and Rash    Physical Exam General: well developed, well nourished middle-age Caucasian male, seated, in no evident distress Head: head normocephalic and atraumatic.  Neck: supple with no carotid or supraclavicular bruits Cardiovascular: regular rate and rhythm, no murmurs Musculoskeletal: no deformity Skin:  no rash/petichiae Vascular:  Normal pulses all extremities Vitals:   06/09/16 1122  BP: (!) 109/56  Pulse: 66   Neurologic Exam Mental Status: Awake and fully alert. Oriented to place and time. Recent and remote memory intact. Attention span, concentration and fund of knowledge appropriate. Mood and affect appropriate.  Cranial Nerves: Fundoscopic exam reveals sharp disc margins. Pupils equal, briskly reactive to light. Extraocular movements full without nystagmus. Visual fields full to confrontation. Hearing intact. Facial sensation intact. Face, tongue, palate moves normally and symmetrically.  Motor: Normal bulk and tone. Normal strength in  all tested extremity muscles.Mild weakness of left grip and intrinsic hand muscles. Orbits right over left upper extremity. Mild weakness of left hip flexor and ankle dorsiflexors also. Sensory.: intact to touch ,pinprick .position and vibratory sensation.  Coordination: Rapid alternating movements normal in all extremities. Finger-to-nose and heel-to-shin performed accurately bilaterally. Gait and Station: Arises from chair without difficulty. Stance is normal. Gait demonstrates normal stride length and balance . Unable to heel, toe and tandem walk without difficulty.  Reflexes: 1+ and symmetric. Toes downgoing.   NIHSS  0 Modified Rankin  2   ASSESSMENT: 24 year Occasion male with right MCA branch infarct of cryptogenic etiology in July 2017 with vascular risk factors of hypertension. And smoking He is doing quite well with only mild hand weakness    PLAN: I had a long d/w patient and his wife about his recent cryptogenic stroke, risk for recurrent stroke/TIAs, personally independently reviewed imaging studies and stroke evaluation results and answered questions.Continue aspirin  325 mg daily  for secondary stroke prevention and maintain strict control of hypertension with blood pressure goal below 130/90, diabetes with hemoglobin A1c goal below 6.5% and lipids with LDL cholesterol goal below 70 mg/dL. I also advised the patient to eat a healthy diet with plenty of whole grains, cereals, fruits and vegetables, exercise regularly and maintain ideal body weight .I strongly counseled him to quit smoking completely and he says he'll try. Check follow-up screening carotid ultrasound study. Followup in the future with me in one year or call earlier if necessary Greater than 50% of time during this 25 minute visit was spent on counseling,explanation of diagnosis of cryptogenic stroke, counseling about smoking cessation, planning of further management, discussion with patient and family and coordination of  care Antony Contras, MD  [facility] [address] [place], [place] [address]  Phone [phone] Fax [phone] Note: This document was prepared with digital dictation and possible smart phrase technology. Any transcriptional errors that result from this process are unintentional

## 2016-06-09 NOTE — Telephone Encounter (Signed)
Per Leonie Man pt needs US carotid doppler. Call pt [phone]

## 2016-06-09 NOTE — Patient Instructions (Signed)
I had a long d/w patient and his wife about his recent cryptogenic stroke, risk for recurrent stroke/TIAs, personally independently reviewed imaging studies and stroke evaluation results and answered questions.Continue aspirin 325 mg daily  for secondary stroke prevention and maintain strict control of hypertension with blood pressure goal below 130/90, diabetes with hemoglobin A1c goal below 6.5% and lipids with LDL cholesterol goal below 70 mg/dL. I also advised the patient to eat a healthy diet with plenty of whole grains, cereals, fruits and vegetables, exercise regularly and maintain ideal body weight .I strongly counseled him to quit smoking completely and he says he'll try. Check follow-up screening carotid ultrasound study. Followup in the future with me in one year or call earlier if necessary

## 2016-06-16 ENCOUNTER — Telehealth: Payer: Self-pay | Admitting: Cardiology

## 2016-06-16 LAB — CUP PACEART REMOTE DEVICE CHECK
Date Time Interrogation Session: 20180308230724
MDC IDC PG IMPLANT DT: 20170711

## 2016-06-16 NOTE — Telephone Encounter (Signed)
Spoke w/ pt wife and requested that pt send a remote transmission w/ his home monitor b/c his monitor has not updated in the last 14 days.

## 2016-06-21 NOTE — Telephone Encounter (Signed)
Patient is scheduled   

## 2016-06-22 ENCOUNTER — Ambulatory Visit (INDEPENDENT_AMBULATORY_CARE_PROVIDER_SITE_OTHER): Payer: Medicare Other

## 2016-06-22 DIAGNOSIS — I639 Cerebral infarction, unspecified: Secondary | ICD-10-CM

## 2016-07-04 ENCOUNTER — Ambulatory Visit (INDEPENDENT_AMBULATORY_CARE_PROVIDER_SITE_OTHER): Payer: Medicare Other | Admitting: *Deleted

## 2016-07-04 DIAGNOSIS — I639 Cerebral infarction, unspecified: Secondary | ICD-10-CM | POA: Diagnosis not present

## 2016-07-04 NOTE — Progress Notes (Signed)
Carelink Summary Report / Loop Recorder 

## 2016-07-06 ENCOUNTER — Telehealth: Payer: Self-pay

## 2016-07-06 NOTE — Telephone Encounter (Signed)
RN call patient and spoke with wife on dpr form. Rn stated the carotid doppler shows mild age related hardening of the arteries but no major blockages to worry about. Pts wife verbalized understanding.

## 2016-07-06 NOTE — Telephone Encounter (Signed)
-----   Message from Garvin Fila, MD sent at 07/06/2016  9:02 AM EDT ----- Bobby Nguyen inform the patient had carotid Doppler study shows mild age-related hardening of the arteries but no major blockages to worry about

## 2016-07-11 LAB — CUP PACEART REMOTE DEVICE CHECK
Date Time Interrogation Session: 20180407234130
Implantable Pulse Generator Implant Date: 20170711

## 2016-07-11 NOTE — Progress Notes (Signed)
Carelink summary report received. Battery status OK. Normal device function. No new symptom episodes, tachy episodes, brady, or pause episodes. No new AF episodes. Monthly summary reports and ROV/PRN 

## 2016-08-01 ENCOUNTER — Ambulatory Visit (INDEPENDENT_AMBULATORY_CARE_PROVIDER_SITE_OTHER): Payer: Medicare Other | Admitting: *Deleted

## 2016-08-01 DIAGNOSIS — Z79899 Other long term (current) drug therapy: Secondary | ICD-10-CM | POA: Diagnosis not present

## 2016-08-01 DIAGNOSIS — M542 Cervicalgia: Secondary | ICD-10-CM | POA: Diagnosis not present

## 2016-08-01 DIAGNOSIS — Z1389 Encounter for screening for other disorder: Secondary | ICD-10-CM | POA: Diagnosis not present

## 2016-08-01 DIAGNOSIS — I10 Essential (primary) hypertension: Secondary | ICD-10-CM | POA: Diagnosis not present

## 2016-08-01 DIAGNOSIS — I639 Cerebral infarction, unspecified: Secondary | ICD-10-CM | POA: Diagnosis not present

## 2016-08-02 NOTE — Progress Notes (Signed)
Carelink Summary Report / Loop Recorder 

## 2016-08-16 LAB — CUP PACEART REMOTE DEVICE CHECK
Date Time Interrogation Session: 20180507233915
Implantable Pulse Generator Implant Date: 20170711

## 2016-08-31 ENCOUNTER — Ambulatory Visit (INDEPENDENT_AMBULATORY_CARE_PROVIDER_SITE_OTHER): Payer: Medicare Other | Admitting: *Deleted

## 2016-08-31 DIAGNOSIS — I639 Cerebral infarction, unspecified: Secondary | ICD-10-CM | POA: Diagnosis not present

## 2016-09-02 NOTE — Progress Notes (Signed)
Carelink Summary Report / Loop Recorder 

## 2016-09-05 LAB — CUP PACEART REMOTE DEVICE CHECK
Implantable Pulse Generator Implant Date: 20170711
MDC IDC SESS DTM: 20180606234406

## 2016-09-05 NOTE — Progress Notes (Signed)
Carelink summary report received. Battery status OK. Normal device function. No new symptom episodes, tachy episodes, brady, or pause episodes. No new AF episodes. Monthly summary reports and ROV/PRN 

## 2016-09-30 ENCOUNTER — Ambulatory Visit (INDEPENDENT_AMBULATORY_CARE_PROVIDER_SITE_OTHER): Payer: Medicare Other | Admitting: *Deleted

## 2016-09-30 DIAGNOSIS — I639 Cerebral infarction, unspecified: Secondary | ICD-10-CM

## 2016-10-03 NOTE — Progress Notes (Signed)
Carelink Summary Report / Loop Recorder 

## 2016-10-06 LAB — CUP PACEART REMOTE DEVICE CHECK
Implantable Pulse Generator Implant Date: 20170711
MDC IDC SESS DTM: 20180707000933

## 2016-10-26 ENCOUNTER — Telehealth: Payer: Self-pay | Admitting: Cardiology

## 2016-10-26 NOTE — Telephone Encounter (Signed)
LMOVM requesting that pt send manual transmission b/c home monitor has not updated in at least 14 days.    

## 2016-10-26 NOTE — Telephone Encounter (Signed)
Bobby Nguyen advised to move monitor to where the patient is sleeping and send manual transmission. She verbalizes understanding and has direct # to device clinic to call back with any questions.

## 2016-10-26 NOTE — Telephone Encounter (Signed)
Patient wife calling, states that she received message about sending manuel transmission. Mrs. Miler states that patient hurt his back and has been sleeping in the recliner downstairs for two weeks.

## 2016-10-31 ENCOUNTER — Ambulatory Visit (INDEPENDENT_AMBULATORY_CARE_PROVIDER_SITE_OTHER): Payer: Medicare Other | Admitting: *Deleted

## 2016-10-31 DIAGNOSIS — I639 Cerebral infarction, unspecified: Secondary | ICD-10-CM | POA: Diagnosis not present

## 2016-11-01 NOTE — Progress Notes (Signed)
Carelink Summary Report / Loop Recorder 

## 2016-11-09 LAB — CUP PACEART REMOTE DEVICE CHECK
Date Time Interrogation Session: 20180806004008
MDC IDC PG IMPLANT DT: 20170711

## 2016-11-11 DIAGNOSIS — Z125 Encounter for screening for malignant neoplasm of prostate: Secondary | ICD-10-CM | POA: Diagnosis not present

## 2016-11-11 DIAGNOSIS — M109 Gout, unspecified: Secondary | ICD-10-CM | POA: Diagnosis not present

## 2016-11-11 DIAGNOSIS — M546 Pain in thoracic spine: Secondary | ICD-10-CM | POA: Diagnosis not present

## 2016-11-11 DIAGNOSIS — E782 Mixed hyperlipidemia: Secondary | ICD-10-CM | POA: Diagnosis not present

## 2016-11-11 DIAGNOSIS — I10 Essential (primary) hypertension: Secondary | ICD-10-CM | POA: Diagnosis not present

## 2016-11-11 DIAGNOSIS — R5383 Other fatigue: Secondary | ICD-10-CM | POA: Diagnosis not present

## 2016-11-11 DIAGNOSIS — Z139 Encounter for screening, unspecified: Secondary | ICD-10-CM | POA: Diagnosis not present

## 2016-11-17 DIAGNOSIS — D225 Melanocytic nevi of trunk: Secondary | ICD-10-CM | POA: Diagnosis not present

## 2016-11-17 DIAGNOSIS — L821 Other seborrheic keratosis: Secondary | ICD-10-CM | POA: Diagnosis not present

## 2016-11-17 DIAGNOSIS — D1801 Hemangioma of skin and subcutaneous tissue: Secondary | ICD-10-CM | POA: Diagnosis not present

## 2016-11-17 DIAGNOSIS — L570 Actinic keratosis: Secondary | ICD-10-CM | POA: Diagnosis not present

## 2016-11-17 DIAGNOSIS — C44311 Basal cell carcinoma of skin of nose: Secondary | ICD-10-CM | POA: Diagnosis not present

## 2016-11-17 DIAGNOSIS — C44519 Basal cell carcinoma of skin of other part of trunk: Secondary | ICD-10-CM | POA: Diagnosis not present

## 2016-11-17 DIAGNOSIS — D2239 Melanocytic nevi of other parts of face: Secondary | ICD-10-CM | POA: Diagnosis not present

## 2016-11-29 ENCOUNTER — Ambulatory Visit (INDEPENDENT_AMBULATORY_CARE_PROVIDER_SITE_OTHER): Payer: Medicare Other | Admitting: *Deleted

## 2016-11-29 DIAGNOSIS — I639 Cerebral infarction, unspecified: Secondary | ICD-10-CM

## 2016-11-30 DIAGNOSIS — C44519 Basal cell carcinoma of skin of other part of trunk: Secondary | ICD-10-CM | POA: Diagnosis not present

## 2016-12-01 NOTE — Progress Notes (Signed)
Carelink Summary Report / Loop Recorder 

## 2016-12-05 LAB — CUP PACEART REMOTE DEVICE CHECK
Date Time Interrogation Session: 20180905014234
Implantable Pulse Generator Implant Date: 20170711

## 2016-12-12 DIAGNOSIS — M542 Cervicalgia: Secondary | ICD-10-CM | POA: Diagnosis not present

## 2016-12-28 DIAGNOSIS — C44311 Basal cell carcinoma of skin of nose: Secondary | ICD-10-CM | POA: Diagnosis not present

## 2016-12-29 ENCOUNTER — Ambulatory Visit (INDEPENDENT_AMBULATORY_CARE_PROVIDER_SITE_OTHER): Payer: Medicare Other | Admitting: *Deleted

## 2016-12-29 DIAGNOSIS — I639 Cerebral infarction, unspecified: Secondary | ICD-10-CM

## 2016-12-30 LAB — CUP PACEART REMOTE DEVICE CHECK
Date Time Interrogation Session: 20181005013941
MDC IDC PG IMPLANT DT: 20170711

## 2016-12-30 NOTE — Progress Notes (Signed)
Loop recorder summary report 

## 2017-01-30 ENCOUNTER — Ambulatory Visit (INDEPENDENT_AMBULATORY_CARE_PROVIDER_SITE_OTHER): Payer: Medicare Other | Admitting: *Deleted

## 2017-01-30 DIAGNOSIS — I639 Cerebral infarction, unspecified: Secondary | ICD-10-CM | POA: Diagnosis not present

## 2017-01-31 NOTE — Progress Notes (Signed)
Carelink Summary Report / Loop Recorder 

## 2017-02-02 LAB — CUP PACEART REMOTE DEVICE CHECK
Implantable Pulse Generator Implant Date: 20170711
MDC IDC SESS DTM: 20181104014131

## 2017-02-03 DIAGNOSIS — Z125 Encounter for screening for malignant neoplasm of prostate: Secondary | ICD-10-CM | POA: Diagnosis not present

## 2017-02-03 DIAGNOSIS — Z0000 Encounter for general adult medical examination without abnormal findings: Secondary | ICD-10-CM | POA: Diagnosis not present

## 2017-02-03 DIAGNOSIS — Z1211 Encounter for screening for malignant neoplasm of colon: Secondary | ICD-10-CM | POA: Diagnosis not present

## 2017-02-03 DIAGNOSIS — Z136 Encounter for screening for cardiovascular disorders: Secondary | ICD-10-CM | POA: Diagnosis not present

## 2017-02-03 DIAGNOSIS — E785 Hyperlipidemia, unspecified: Secondary | ICD-10-CM | POA: Diagnosis not present

## 2017-02-15 DIAGNOSIS — I10 Essential (primary) hypertension: Secondary | ICD-10-CM | POA: Diagnosis not present

## 2017-02-15 DIAGNOSIS — Z79899 Other long term (current) drug therapy: Secondary | ICD-10-CM | POA: Diagnosis not present

## 2017-02-15 DIAGNOSIS — M542 Cervicalgia: Secondary | ICD-10-CM | POA: Diagnosis not present

## 2017-02-27 ENCOUNTER — Ambulatory Visit (INDEPENDENT_AMBULATORY_CARE_PROVIDER_SITE_OTHER): Payer: Medicare Other | Admitting: *Deleted

## 2017-02-27 DIAGNOSIS — I639 Cerebral infarction, unspecified: Secondary | ICD-10-CM | POA: Diagnosis not present

## 2017-02-28 NOTE — Progress Notes (Signed)
Carelink Summary Report / Loop Recorder 

## 2017-03-07 LAB — CUP PACEART REMOTE DEVICE CHECK
Date Time Interrogation Session: 20181204023947
Implantable Pulse Generator Implant Date: 20170711

## 2017-03-09 DIAGNOSIS — D126 Benign neoplasm of colon, unspecified: Secondary | ICD-10-CM | POA: Diagnosis not present

## 2017-03-09 DIAGNOSIS — K635 Polyp of colon: Secondary | ICD-10-CM | POA: Diagnosis not present

## 2017-03-09 DIAGNOSIS — K640 First degree hemorrhoids: Secondary | ICD-10-CM | POA: Diagnosis not present

## 2017-03-09 DIAGNOSIS — K5520 Angiodysplasia of colon without hemorrhage: Secondary | ICD-10-CM | POA: Diagnosis not present

## 2017-03-09 DIAGNOSIS — Z86010 Personal history of colonic polyps: Secondary | ICD-10-CM | POA: Diagnosis not present

## 2017-03-14 DIAGNOSIS — D126 Benign neoplasm of colon, unspecified: Secondary | ICD-10-CM | POA: Diagnosis not present

## 2017-03-14 DIAGNOSIS — K635 Polyp of colon: Secondary | ICD-10-CM | POA: Diagnosis not present

## 2017-03-29 ENCOUNTER — Ambulatory Visit (INDEPENDENT_AMBULATORY_CARE_PROVIDER_SITE_OTHER): Payer: Medicare Other | Admitting: *Deleted

## 2017-03-29 DIAGNOSIS — I639 Cerebral infarction, unspecified: Secondary | ICD-10-CM | POA: Diagnosis not present

## 2017-03-30 NOTE — Progress Notes (Signed)
Carelink Summary Report / Loop Recorder 

## 2017-04-11 LAB — CUP PACEART REMOTE DEVICE CHECK
Implantable Pulse Generator Implant Date: 20170711
MDC IDC SESS DTM: 20190103031356

## 2017-04-28 ENCOUNTER — Ambulatory Visit (INDEPENDENT_AMBULATORY_CARE_PROVIDER_SITE_OTHER): Payer: Medicare Other | Admitting: *Deleted

## 2017-04-28 DIAGNOSIS — I639 Cerebral infarction, unspecified: Secondary | ICD-10-CM

## 2017-05-01 NOTE — Progress Notes (Signed)
Carelink Summary Report / Loop Recorder 

## 2017-05-02 LAB — CUP PACEART REMOTE DEVICE CHECK
Implantable Pulse Generator Implant Date: 20170711
MDC IDC SESS DTM: 20190202034441

## 2017-05-13 IMAGING — CT CT HEAD CODE STROKE
3 of 4 series · 17 of 47 positions shown, 20 images · non-contrast
Comparison: 06/19/2007

CLINICAL DATA: Left-sided facial droop and dysarthria. Left arm
numbness. Symptoms began 20 hours ago approximately. Worsening of
symptoms during the day.

EXAM:
CT HEAD WITHOUT CONTRAST
TECHNIQUE: Contiguous axial images were obtained from the base of the skull
through the vertex without intravenous contrast.

[Series 201: head w/o, idose (1) · axial · non-contrast · 0.44mm/px · z∈[+197,+327]mm · 11 of 32 slices shown, 14 images]
[im 3/32  brain]
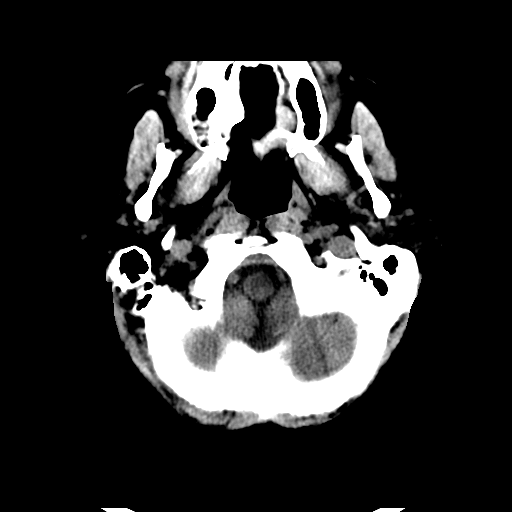
[im 3/32  bone]
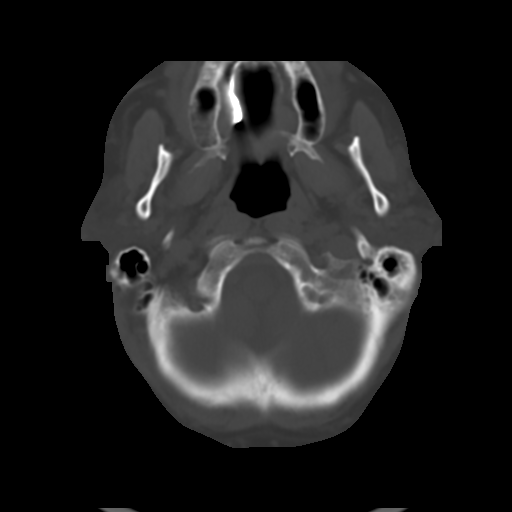
[im 5/32  brain]
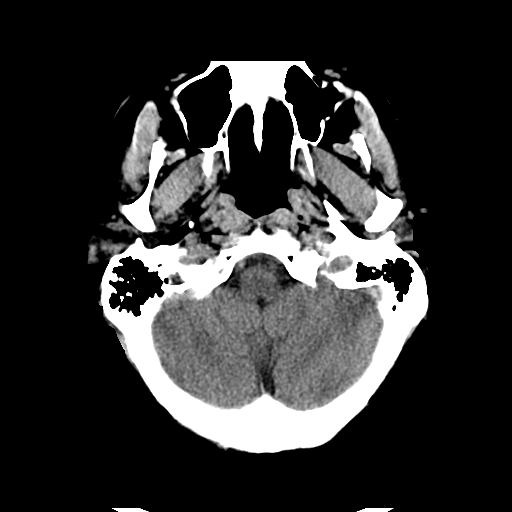
[im 7/32  brain]
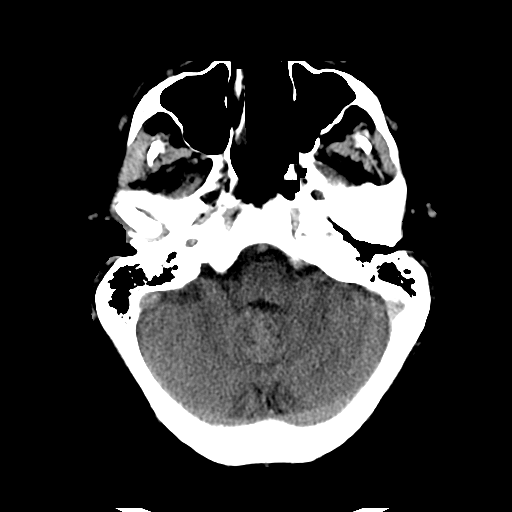
[im 12/32  brain]
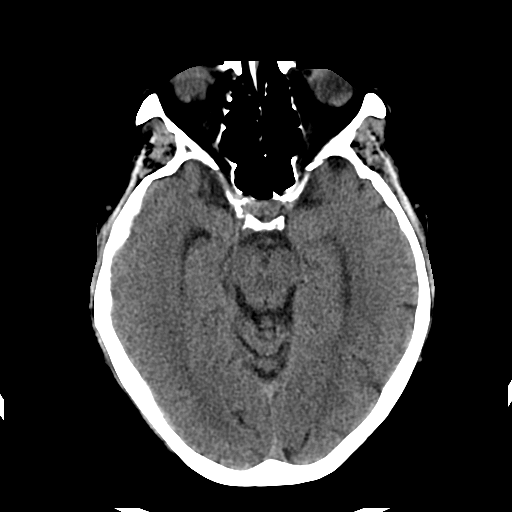
[im 14/32  brain]
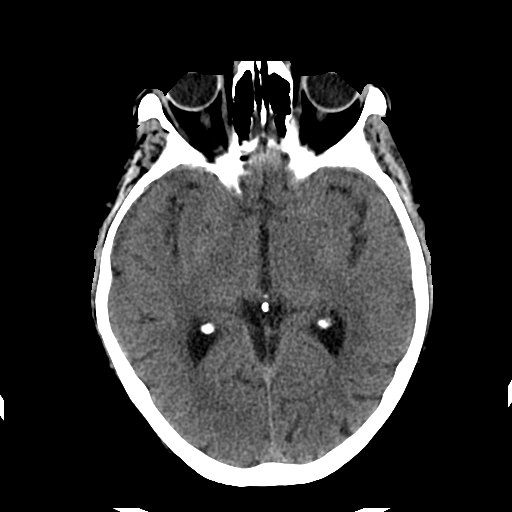
[im 14/32  bone]
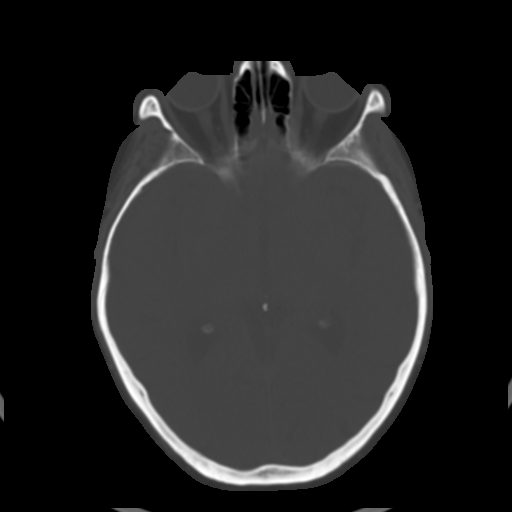
[im 16/32  brain]
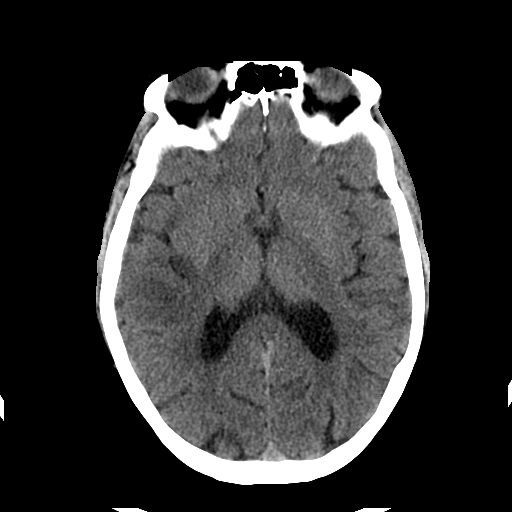
[im 18/32  brain]
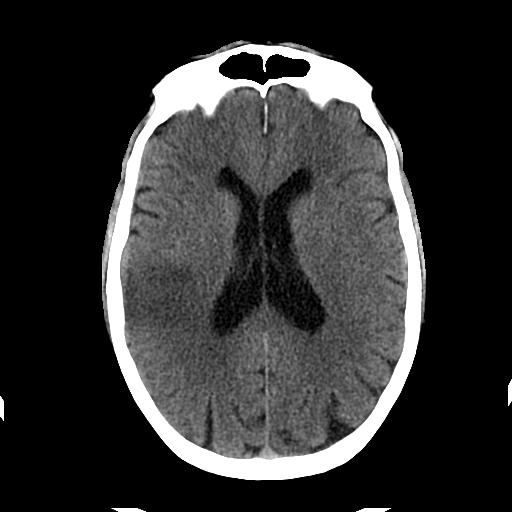
[im 20/32  brain]
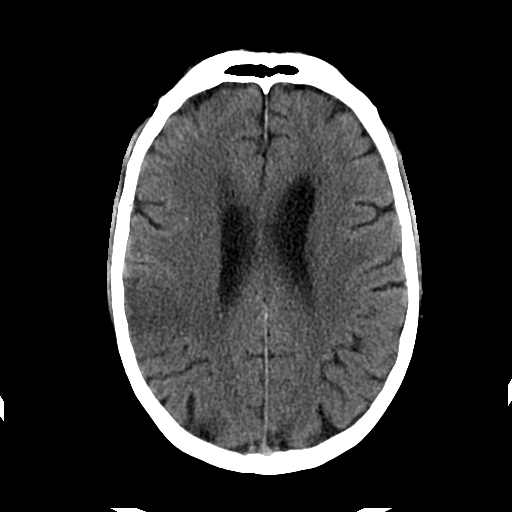
[im 25/32  brain]
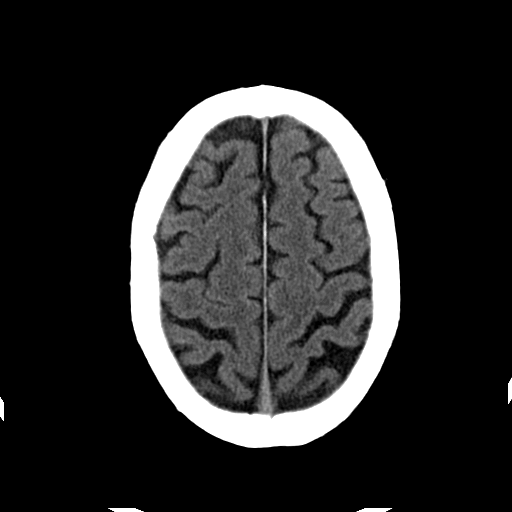
[im 25/32  bone]
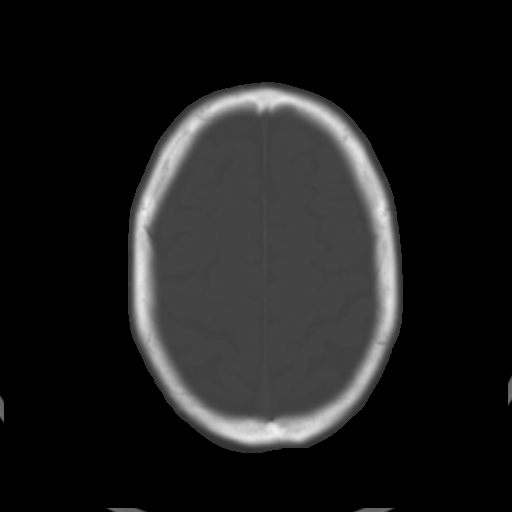
[im 27/32  brain]
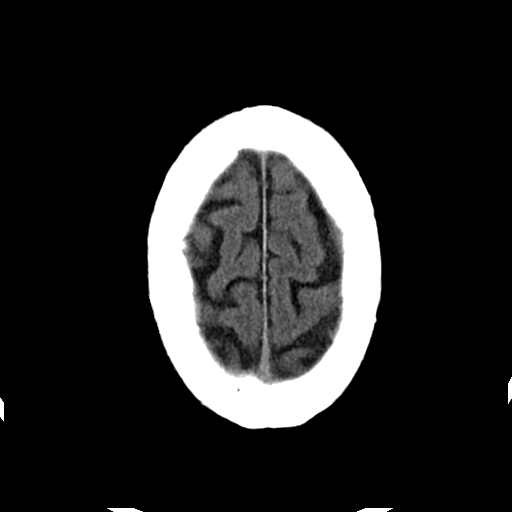
[im 29/32  brain]
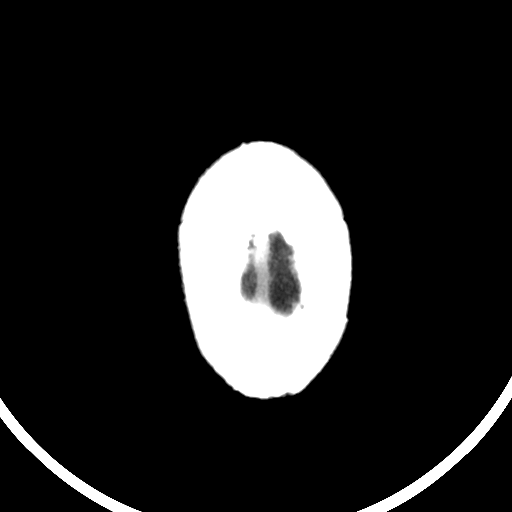

[Series 203: coronal st, idose (1) · coronal · 0.40mm/px · 3 of 74 slices shown]
[im 25/74  brain]
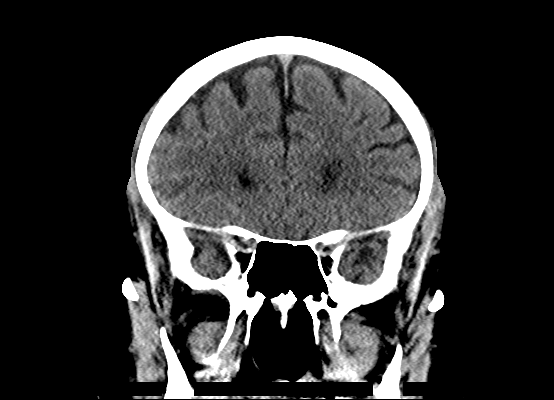
[im 33/74  brain]
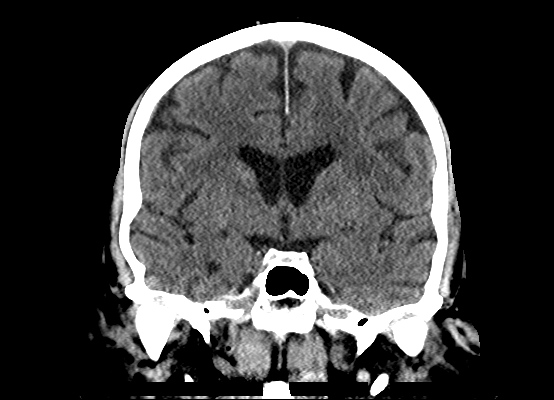
[im 41/74  brain]
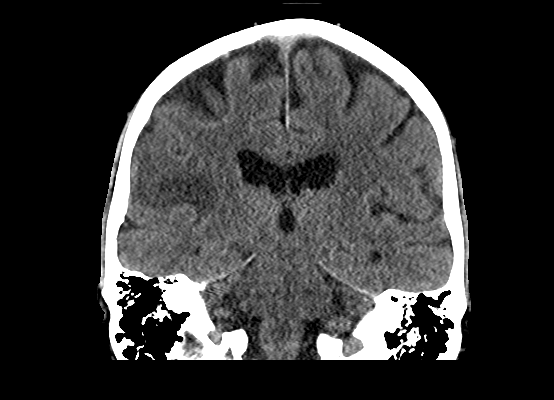

[Series 204: sagittal st, idose (1) · sagittal · 0.40mm/px · 3 of 74 slices shown]
[im 25/74  brain]
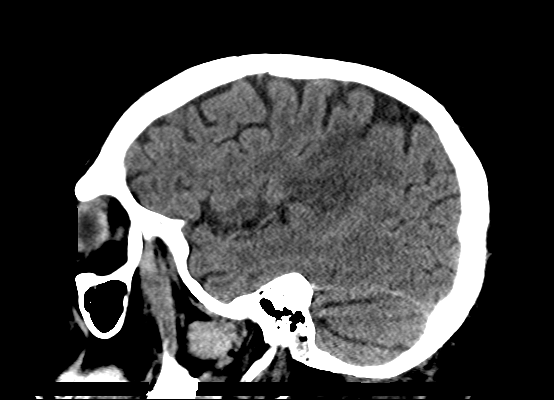
[im 37/74  brain]
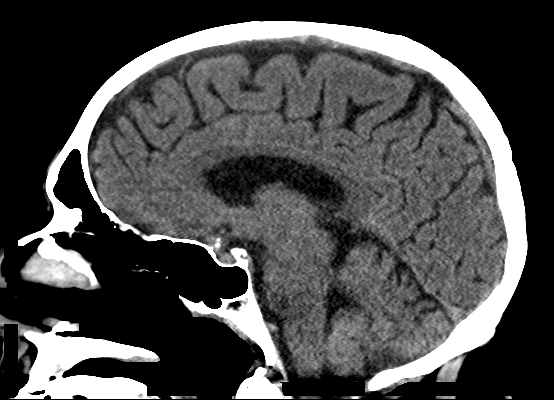
[im 49/74  brain]
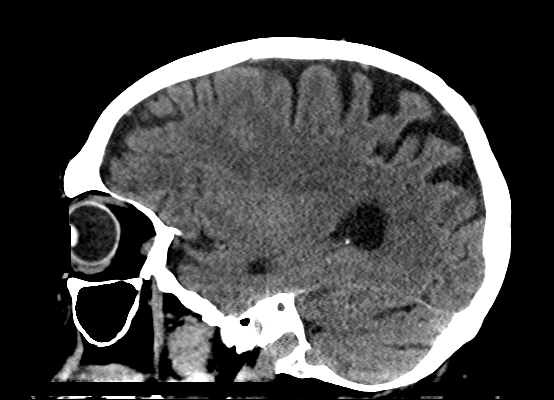

[17 of 47 positions shown; findings below may reference images not displayed]

FINDINGS: The brainstem and cerebellum are normal. The left cerebral
hemispheres normal except for mild chronic small-vessel change of
the deep white matter. Right hemisphere shows low density at the
frontoparietal junction region measuring about 3-4 cm in size
consistent with late acute infarction. Basal ganglia are spared.
Insular brain is spared. No evidence of mass effect or hemorrhage.
No hydrocephalus or extra-axial collection. I would consider this to
a ball the M2, M3 and M 6 regions in therefore would assigned this
aspects 7.
IMPRESSION: Late acute infarction in the right MCA territory affecting M2, M3
and M6 regions consistent with aspects score 7. No mass effect or
hemorrhage.

## 2017-05-14 IMAGING — CR DG CHEST 2V
2 series · 2 of 2 positions shown · non-contrast
Comparison: 11/25/2009

CLINICAL DATA: Cerebral infarction.

EXAM:
CHEST  2 VIEW

[chest pa]
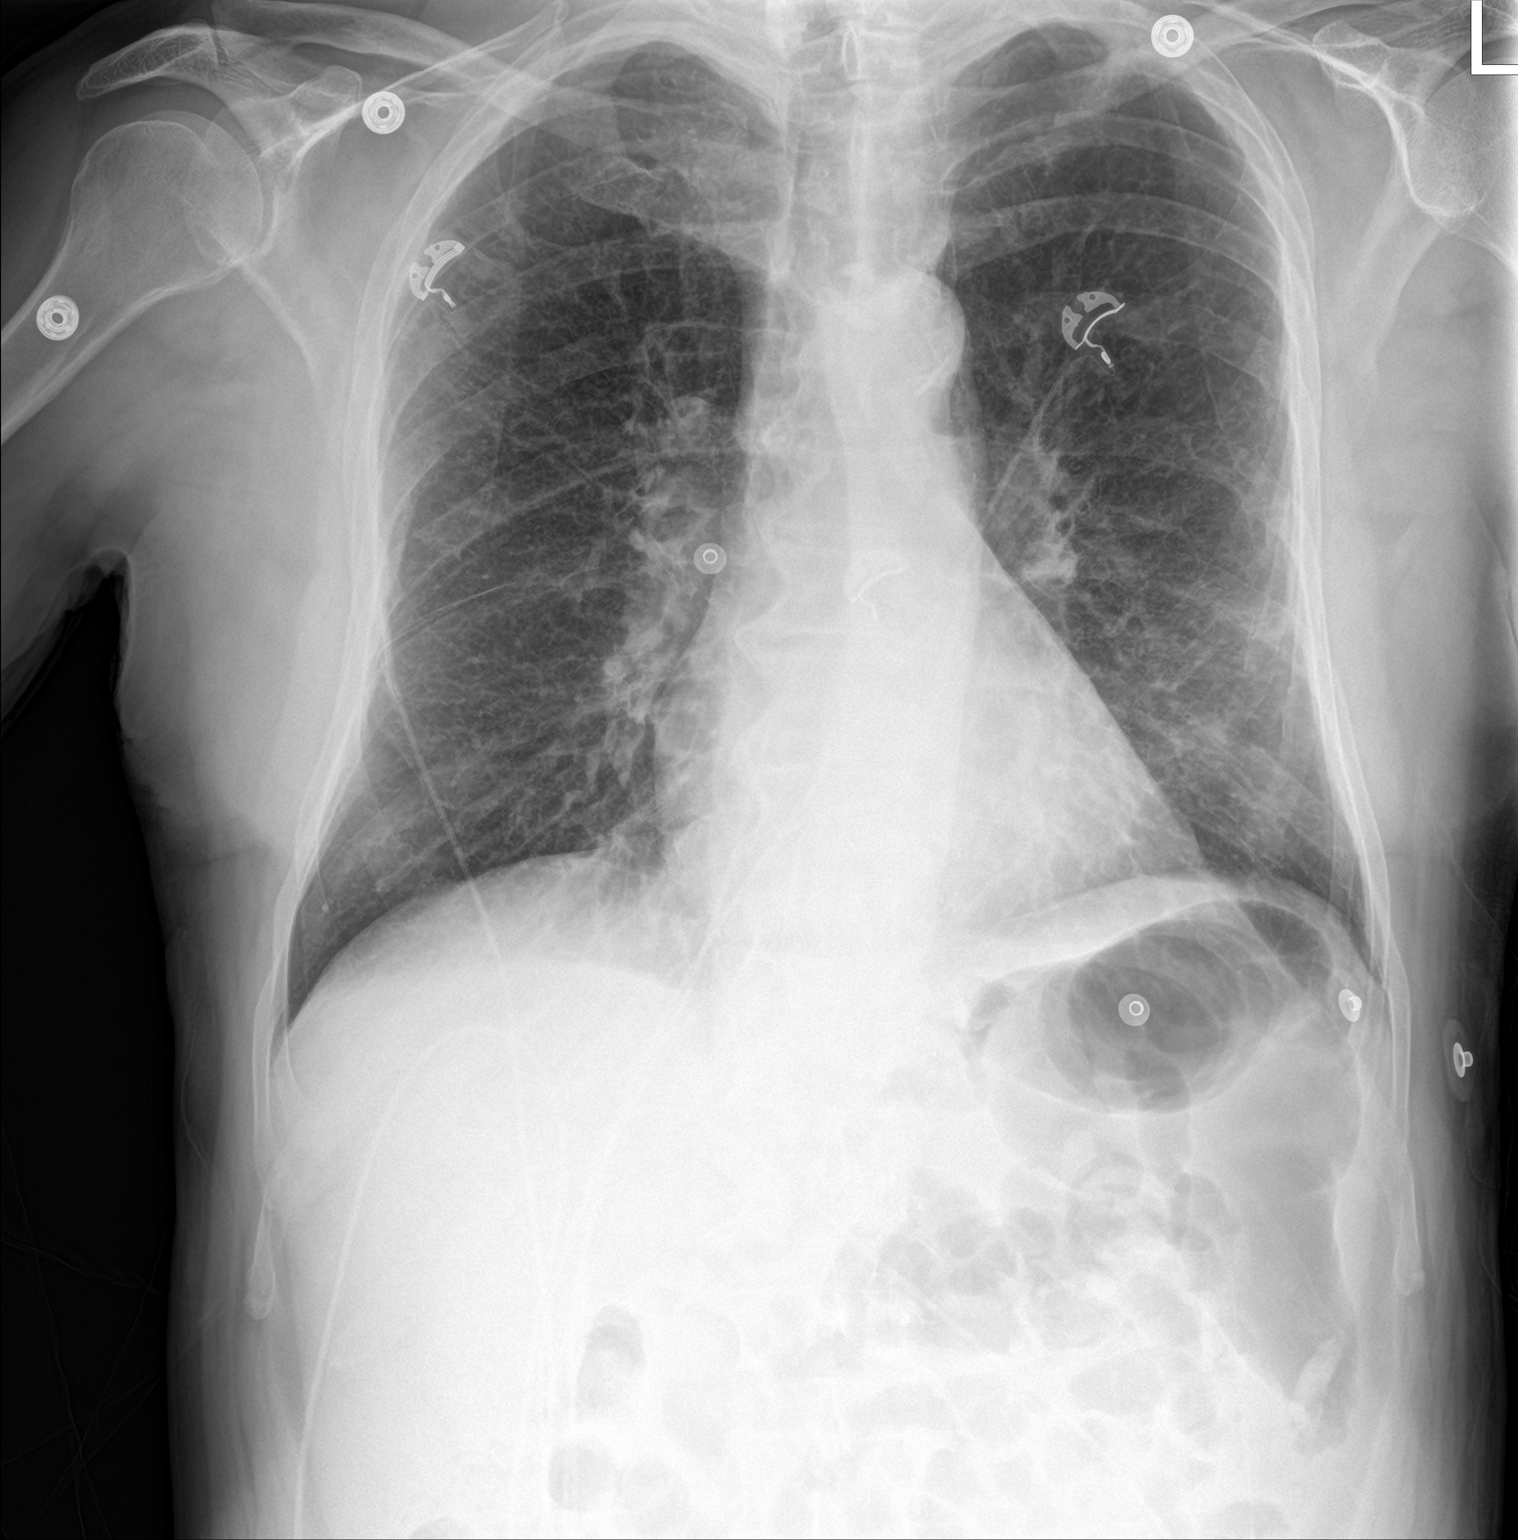

[chest lat]
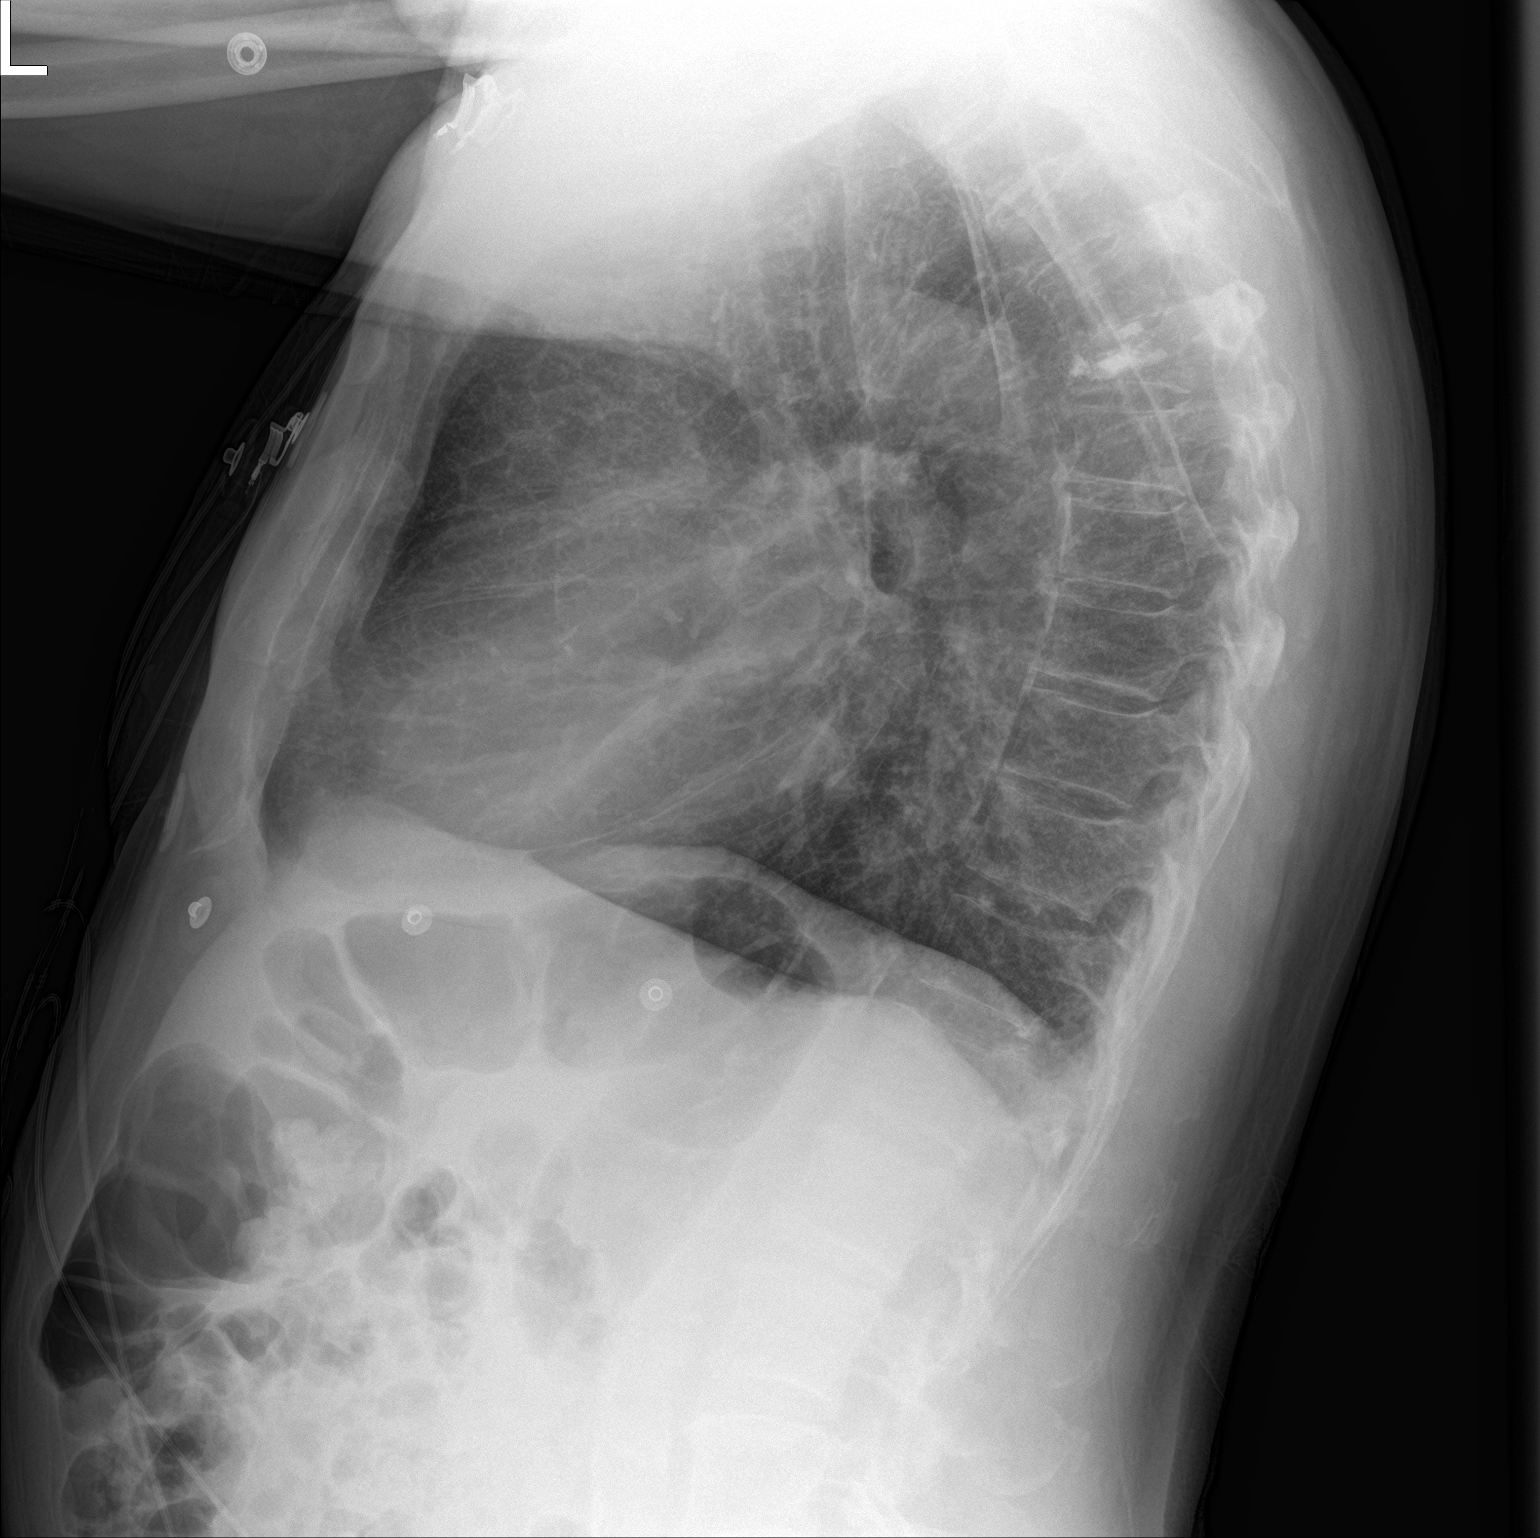

[2 of 2 positions shown; findings below may reference images not displayed]

FINDINGS: The heart size and mediastinal contours are within normal limits.
Stable parenchymal scarring bilaterally. There is no evidence of
pulmonary edema, consolidation, pneumothorax, nodule or pleural
fluid. Stable appearance of old left-sided rib fractures.
IMPRESSION: No active cardiopulmonary disease.

## 2017-05-19 DIAGNOSIS — M109 Gout, unspecified: Secondary | ICD-10-CM | POA: Diagnosis not present

## 2017-05-19 DIAGNOSIS — Z139 Encounter for screening, unspecified: Secondary | ICD-10-CM | POA: Diagnosis not present

## 2017-05-19 DIAGNOSIS — I10 Essential (primary) hypertension: Secondary | ICD-10-CM | POA: Diagnosis not present

## 2017-05-19 DIAGNOSIS — M542 Cervicalgia: Secondary | ICD-10-CM | POA: Diagnosis not present

## 2017-05-19 DIAGNOSIS — E782 Mixed hyperlipidemia: Secondary | ICD-10-CM | POA: Diagnosis not present

## 2017-05-25 DIAGNOSIS — D2239 Melanocytic nevi of other parts of face: Secondary | ICD-10-CM | POA: Diagnosis not present

## 2017-05-25 DIAGNOSIS — L570 Actinic keratosis: Secondary | ICD-10-CM | POA: Diagnosis not present

## 2017-05-25 DIAGNOSIS — L821 Other seborrheic keratosis: Secondary | ICD-10-CM | POA: Diagnosis not present

## 2017-05-25 DIAGNOSIS — D485 Neoplasm of uncertain behavior of skin: Secondary | ICD-10-CM | POA: Diagnosis not present

## 2017-05-25 DIAGNOSIS — C44319 Basal cell carcinoma of skin of other parts of face: Secondary | ICD-10-CM | POA: Diagnosis not present

## 2017-05-31 ENCOUNTER — Ambulatory Visit (INDEPENDENT_AMBULATORY_CARE_PROVIDER_SITE_OTHER): Payer: Medicare Other | Admitting: *Deleted

## 2017-05-31 DIAGNOSIS — I639 Cerebral infarction, unspecified: Secondary | ICD-10-CM | POA: Diagnosis not present

## 2017-06-01 NOTE — Progress Notes (Signed)
Carelink Summary Report / Loop Recorder 

## 2017-06-20 ENCOUNTER — Ambulatory Visit: Payer: Medicare Other | Admitting: Adult Health

## 2017-06-20 ENCOUNTER — Encounter: Payer: Self-pay | Admitting: Adult Health

## 2017-06-20 VITALS — BP 124/62 | HR 57 | Wt 178.4 lb

## 2017-06-20 DIAGNOSIS — I10 Essential (primary) hypertension: Secondary | ICD-10-CM | POA: Diagnosis not present

## 2017-06-20 DIAGNOSIS — E785 Hyperlipidemia, unspecified: Secondary | ICD-10-CM | POA: Diagnosis not present

## 2017-06-20 DIAGNOSIS — I639 Cerebral infarction, unspecified: Secondary | ICD-10-CM

## 2017-06-20 NOTE — Patient Instructions (Signed)
Continue aspirin 325 mg daily  and fenofibrate  for secondary stroke prevention  Follow up with your PCP regarding blood pressure and cholesterol  Be more careful at home regarding your skin and bruising    Maintain strict control of hypertension with blood pressure goal below 130/90, diabetes with hemoglobin A1c goal below 6.5% and cholesterol with LDL cholesterol (bad cholesterol) goal below 70 mg/dL. I also advised the patient to eat a healthy diet with plenty of whole grains, cereals, fruits and vegetables, exercise regularly and maintain ideal body weight.  Followup in the future with me just as needed

## 2017-06-20 NOTE — Progress Notes (Signed)
[facility] [address] [place]. [place] [address] [phone]       OFFICE FOLLOW-UP NOTE  Bobby Nguyen Date of Birth:  April 06, 1945 Medical Record Number:  [id number]   HPI:  Initial visit 12/09/15 :Bobby Nguyen is a 61 year Caucasian male who is seen today for first office follow-up visit for hospital admission in July 2017 with stroke. He is accompanied by his wife and daughter.Bobby Nguyen is a 72 y.o. male who awoke this morning 10/04/2015 (LKW 10/03/2015, time unknown) with numbness. He states that he may have had some trouble with blurred vision yesterday at some time, but is not certain. What is certain is that when he awoke this morning his entire left side was numb. He was also slurring his speech and had some difficulty walking. Due to this persisting, he came into the emergency room where a CT was performed which shows a right MCA branch distribution infarct. Patient was not administered IV t-PA secondary to being out of the window. He was admitted for further evaluation and treatment. MRI scan of the brain could not be obtained since patient had metallic plate, screws and dental implants. CT scan showed right MCA infarcts both old and new. Carotid Doppler showed no significant extracranial stenosis. Transthoracic echo showed normal ejection fraction without cardiac source of embolism. Transcranial Doppler showed evidence of diffuse intracranial atherosclerosis. LDL cholesterol was 84 g percent. Hemoglobin A1c was 5.4. CT angiogram of the brain showed right frontoparietal insular/opercular wedge-shaped hypodensity consistent with an acute infarct. CT brain showed distal right M2 occlusion and CTA neck showed no significant extracranial stenosis. Patient was seen by physical outpatient therapy and felt to be stable enough to discharge him. He states is doing well and his regained most of his strength back on the left but his left hand and fine motor skills diminished. He in  fact was seen in urgent care for pain and swelling on the dorsal aspect of his left hand 10 days ago due to cellulitis and gout. He was treated with a course of steroids and antibiotics with good improvement. Is currently at home and independent in all activities of daily living. He has slight difficulty with buttoning his shirt and fine motor skills involving his hand. He starting aspirin well without bleeding but does have minor bruising. He states his blood pressure is well controlled and today it is 110/50. He is still smoking but is ready to quit.  Update 06/09/2016 : He returns for follow-up after last visit 6 months ago. Is accompanied by his wife. He remains on aspirin and is tolerating well without bleeding but does get bruised easily. His blood pressure is well controlled and today it is 109/56. His tolerating fenofibrate well without side effects and had lipid profile checked a month ago which was satisfactory but I do not have the report. He has not yet been found to have paroxysmal A. fib and loop recorder. Patient continues to smoke but states he is trying to quit. He has no other new complaints today.  Update 06/20/17: Patient returns today for one-year follow-up.  He continues to complain of left hand and finger numbness but denies pain.  Continues to take aspirin with reports of increase in bleeding and bruising.  He does work outside a lot on [employer] and states that he needs to be more careful and ensure he wears gloves and long sleeve shirts.  He does continue to take fenofibrate without side effects or myalgias.  He recently had his lipid levels checked by his PCP and they were satisfactory.  Blood pressure today satisfactory 124/52.  Does continue to smoke 2 packs of cigarettes per day and has no intention of quitting at this time.  Does drink approximately 1-2 beers 2 to 3 days/week.  Loop recorder has not shown atrial fibrillation at this time.  Denies new or worsening stroke/TIA  symptoms.   ROS:   14 system review of systems is positive for moles and all other systems negative  PMH:  Past Medical History:  Diagnosis Date  . Back pain   . Gout   . Hyperlipidemia   . Hypertension   . Stroke [facility])     Social History:  Social History   Socioeconomic History  . Marital status: Married    Spouse name: Not on file  . Number of children: Not on file  . Years of education: Not on file  . Highest education level: Not on file  Occupational History  . Not on file  Social Needs  . Financial resource strain: Not on file  . Food insecurity:    Worry: Not on file    Inability: Not on file  . Transportation needs:    Medical: Not on file    Non-medical: Not on file  Tobacco Use  . Smoking status: Current Every Day Smoker    Packs/day: 2.00    Types: Cigarettes  . Smokeless tobacco: Never Used  . Tobacco comment: 1-2 based on weather  Substance and Sexual Activity  . Alcohol use: Yes    Alcohol/week: 2.4 oz    Types: 4 Cans of beer per week  . Drug use: No  . Sexual activity: Not on file  Lifestyle  . Physical activity:    Days per week: Not on file    Minutes per session: Not on file  . Stress: Not on file  Relationships  . Social connections:    Talks on phone: Not on file    Gets together: Not on file    Attends religious service: Not on file    Active member of club or organization: Not on file    Attends meetings of clubs or organizations: Not on file    Relationship status: Not on file  . Intimate partner violence:    Fear of current or ex partner: Not on file    Emotionally abused: Not on file    Physically abused: Not on file    Forced sexual activity: Not on file  Other Topics Concern  . Not on file  Social History Narrative  . Not on file    Medications:   Current Outpatient Medications on File Prior to Visit  Medication Sig Dispense Refill  . allopurinol (ZYLOPRIM) 300 MG tablet Take 300 mg by mouth daily.    Marland Kitchen amLODipine  (NORVASC) 10 MG tablet     . aspirin 325 MG tablet Take 1 tablet (325 mg total) by mouth daily. 30 tablet 0  . atenolol (TENORMIN) 50 MG tablet Take 50 mg by mouth daily.    . celecoxib (CELEBREX) 100 MG capsule TAKE 1 CAPSULE BY MOUTH TWICE DAILY FOR CHRONIC NECK/BACK PAIN  5  . escitalopram (LEXAPRO) 10 MG tablet     . fenofibrate 160 MG tablet Take 160 mg by mouth daily.    Marland Kitchen morphine (MS CONTIN) 15 MG 12 hr tablet Take 15 mg by mouth 2 (two) times daily.  0  . oxyCODONE-acetaminophen (PERCOCET/ROXICET) 5-325 MG tablet  Take 1 tablet by mouth every 8 (eight) hours as needed for severe pain.     No current facility-administered medications on file prior to visit.     Allergies:   Allergies  Allergen Reactions  . Penicillins Itching, Swelling and Rash    Physical Exam General: well developed, elderly Caucasian male, seated, in no evident distress Head: head normocephalic and atraumatic.  Neck: supple with no carotid or supraclavicular bruits Cardiovascular: regular rate and rhythm, no murmurs Musculoskeletal: no deformity Skin:  no rash/petichiae Vascular:  Normal pulses all extremities Vitals:   06/20/17 1121  BP: 124/62  Pulse: (!) 57   Neurologic Exam Mental Status: Awake and fully alert. Oriented to place and time. Recent and remote memory intact. Attention span, concentration and fund of knowledge appropriate. Mood and affect appropriate.  Cranial Nerves: Fundoscopic exam reveals sharp disc margins. Pupils equal, briskly reactive to light. Extraocular movements full without nystagmus. Visual fields full to confrontation. Hearing intact. Facial sensation intact. Face, tongue, palate moves normally and symmetrically.  Motor: Normal bulk and tone. Normal strength in all tested extremity muscles.Mild weakness of left grip and intrinsic hand muscles. Orbits right over left upper extremity. Mild weakness of left hip flexor and ankle dorsiflexors also. Sensory.:  Pinprick sensation  decreased on left hand distally to fingertips. Coordination: Rapid alternating movements normal in all extremities. Finger-to-nose and heel-to-shin performed accurately bilaterally. Gait and Station: Arises from chair without difficulty. Stance is normal. Gait demonstrates normal stride length and balance . Unable to heel, toe and tandem walk without difficulty.  Reflexes: 1+ and symmetric. Toes downgoing.    IMAGING:  CT head 10/04/15 Late acute infarction in the right MCA territory affecting M2, M3 and M6 regions consistent with aspects score 7. No mass effect or Hemorrhage.  CTA head/neck 10/06/15 CTA HEAD: Evolving small to moderate RIGHT MCA territory infarct without hemorrhagic conversion. Otherwise negative CT HEAD for age. CTA NECK: Atherosclerosis without hemodynamically significant stenosis or acute vascular process. CTA HEAD: Distal RIGHT M2 occlusion corresponding to known late acute MCA territory infarct. Mild intracranial atherosclerosis.   2D ECHO 10/05/15 Left ventricle: The cavity size was normal. Wall thickness was   increased in a pattern of mild LVH. Systolic function was normal.   The estimated ejection fraction was in the range of 55% to 60%.   Wall motion was normal; there were no regional wall motion   abnormalities. There was no evidence of elevated ventricular   filling pressure by Doppler parameters.   TEE 10/08/15 Left atrium: No evidence of thrombus in the atrial cavity or appendage. Atrial septum: Tiny PFO as tested with injection of agitated saline with rare bubble seen in LA.   Loop Recorder inserted 10/07/17   Carotid Bilateral US 06/22/16 Mild age-related hardening of the arteries but no major blockages     ASSESSMENT: 75 year Occasion male with right MCA branch infarct of cryptogenic etiology in July 2017 with vascular risk factors of hypertension and smoking. Doing well at todays visit with residual left hand numbness after stroke.     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 Patient okay with continuing current dose of asa 325mg  even with increased bruising - pt states he will be more careful -Maintain strict  control of hypertension with blood pressure goal below 130/90, diabetes with hemoglobin A1c goal below 6.5% and cholesterol with LDL cholesterol (bad cholesterol) goal below 70 mg/dL. I also advised the patient to eat a healthy diet with plenty of whole grains, cereals, fruits and vegetables, exercise regularly and maintain ideal body weight.  Followup in the future with me just as needed  Greater than 50% time during this 25 minute consultation visit was spent on counseling and coordination of care about HTN and HLD (risk factors), discussion about risk benefit of anticoagulation and answering questions.    Venancio Poisson, AGNP-BC  [facility] [address] [place], [place] [address]  Phone [phone] Fax [phone]

## 2017-06-21 DIAGNOSIS — D0439 Carcinoma in situ of skin of other parts of face: Secondary | ICD-10-CM | POA: Diagnosis not present

## 2017-06-21 NOTE — Progress Notes (Signed)
I agree with the above plan 

## 2017-07-03 ENCOUNTER — Ambulatory Visit (INDEPENDENT_AMBULATORY_CARE_PROVIDER_SITE_OTHER): Payer: Medicare Other | Admitting: *Deleted

## 2017-07-03 DIAGNOSIS — I639 Cerebral infarction, unspecified: Secondary | ICD-10-CM

## 2017-07-04 NOTE — Progress Notes (Signed)
Carelink Summary Report / Loop Recorder 

## 2017-07-13 LAB — CUP PACEART REMOTE DEVICE CHECK
Date Time Interrogation Session: 20190307033525
MDC IDC PG IMPLANT DT: 20170711

## 2017-08-04 LAB — CUP PACEART REMOTE DEVICE CHECK
MDC IDC PG IMPLANT DT: 20170711
MDC IDC SESS DTM: 20190409074130

## 2017-08-07 ENCOUNTER — Ambulatory Visit (INDEPENDENT_AMBULATORY_CARE_PROVIDER_SITE_OTHER): Payer: Medicare Other | Admitting: *Deleted

## 2017-08-07 DIAGNOSIS — I639 Cerebral infarction, unspecified: Secondary | ICD-10-CM | POA: Diagnosis not present

## 2017-08-08 NOTE — Progress Notes (Signed)
Carelink Summary Report / Loop Recorder 

## 2017-08-30 LAB — CUP PACEART REMOTE DEVICE CHECK
Date Time Interrogation Session: 20190512080513
Implantable Pulse Generator Implant Date: 20170711

## 2017-09-08 ENCOUNTER — Ambulatory Visit (INDEPENDENT_AMBULATORY_CARE_PROVIDER_SITE_OTHER): Payer: Medicare Other | Admitting: *Deleted

## 2017-09-08 DIAGNOSIS — I639 Cerebral infarction, unspecified: Secondary | ICD-10-CM | POA: Diagnosis not present

## 2017-09-11 NOTE — Progress Notes (Signed)
Carelink Summary Report / Loop Recorder 

## 2017-10-11 ENCOUNTER — Ambulatory Visit (INDEPENDENT_AMBULATORY_CARE_PROVIDER_SITE_OTHER): Payer: Medicare Other | Admitting: *Deleted

## 2017-10-11 DIAGNOSIS — I639 Cerebral infarction, unspecified: Secondary | ICD-10-CM

## 2017-10-11 NOTE — Progress Notes (Signed)
Carelink Summary Report / Loop Recorder 

## 2017-10-14 LAB — CUP PACEART REMOTE DEVICE CHECK
Date Time Interrogation Session: 20190614120947
MDC IDC PG IMPLANT DT: 20170711

## 2017-11-13 ENCOUNTER — Ambulatory Visit (INDEPENDENT_AMBULATORY_CARE_PROVIDER_SITE_OTHER): Payer: Medicare Other | Admitting: *Deleted

## 2017-11-13 DIAGNOSIS — I639 Cerebral infarction, unspecified: Secondary | ICD-10-CM

## 2017-11-14 NOTE — Progress Notes (Signed)
Carelink Summary Report / Loop Recorder 

## 2017-11-28 LAB — CUP PACEART REMOTE DEVICE CHECK
Implantable Pulse Generator Implant Date: 20170711
MDC IDC SESS DTM: 20190717120735

## 2017-12-18 ENCOUNTER — Ambulatory Visit (INDEPENDENT_AMBULATORY_CARE_PROVIDER_SITE_OTHER): Payer: Medicare Other | Admitting: *Deleted

## 2017-12-18 DIAGNOSIS — I639 Cerebral infarction, unspecified: Secondary | ICD-10-CM

## 2017-12-18 LAB — CUP PACEART REMOTE DEVICE CHECK
Implantable Pulse Generator Implant Date: 20170711
MDC IDC SESS DTM: 20190819120842

## 2017-12-18 NOTE — Progress Notes (Signed)
Carelink Summary Report / Loop Recorder 

## 2017-12-25 LAB — CUP PACEART REMOTE DEVICE CHECK
Date Time Interrogation Session: 20190921123718
MDC IDC PG IMPLANT DT: 20170711

## 2018-01-18 ENCOUNTER — Ambulatory Visit (INDEPENDENT_AMBULATORY_CARE_PROVIDER_SITE_OTHER): Payer: Medicare Other | Admitting: *Deleted

## 2018-01-18 DIAGNOSIS — I639 Cerebral infarction, unspecified: Secondary | ICD-10-CM

## 2018-01-18 NOTE — Progress Notes (Signed)
Carelink Summary Report / Loop Recorder 

## 2018-02-02 LAB — CUP PACEART REMOTE DEVICE CHECK
Implantable Pulse Generator Implant Date: 20170711
MDC IDC SESS DTM: 20191024123726

## 2018-02-20 ENCOUNTER — Ambulatory Visit (INDEPENDENT_AMBULATORY_CARE_PROVIDER_SITE_OTHER): Payer: Medicare Other

## 2018-02-20 DIAGNOSIS — I639 Cerebral infarction, unspecified: Secondary | ICD-10-CM | POA: Diagnosis not present

## 2018-02-20 NOTE — Progress Notes (Signed)
Carelink Summary Report / Loop Recorder 

## 2018-03-26 ENCOUNTER — Ambulatory Visit (INDEPENDENT_AMBULATORY_CARE_PROVIDER_SITE_OTHER): Payer: Medicare Other

## 2018-03-26 DIAGNOSIS — I639 Cerebral infarction, unspecified: Secondary | ICD-10-CM | POA: Diagnosis not present

## 2018-03-26 NOTE — Progress Notes (Signed)
Carelink Summary Report / Loop Recorder 

## 2018-03-27 LAB — CUP PACEART REMOTE DEVICE CHECK
Date Time Interrogation Session: 20191231174439
Implantable Pulse Generator Implant Date: 20170711

## 2018-04-08 LAB — CUP PACEART REMOTE DEVICE CHECK
Date Time Interrogation Session: 20191126134024
Implantable Pulse Generator Implant Date: 20170711

## 2018-04-27 ENCOUNTER — Ambulatory Visit (INDEPENDENT_AMBULATORY_CARE_PROVIDER_SITE_OTHER): Payer: Medicare Other

## 2018-04-27 DIAGNOSIS — I639 Cerebral infarction, unspecified: Secondary | ICD-10-CM

## 2018-04-27 LAB — CUP PACEART REMOTE DEVICE CHECK
Implantable Pulse Generator Implant Date: 20170711
MDC IDC SESS DTM: 20200131151013

## 2018-05-04 NOTE — Progress Notes (Signed)
Carelink Summary Report / Loop Recorder 

## 2018-05-17 ENCOUNTER — Telehealth: Payer: Self-pay | Admitting: Adult Health

## 2018-05-17 NOTE — Telephone Encounter (Signed)
Left vm on patients wife home phone to call back .

## 2018-05-17 NOTE — Telephone Encounter (Signed)
Highly recommend patient proceed to ED for further evaluation of these episodes to assess for potential new stroke versus seizure activity

## 2018-05-17 NOTE — Telephone Encounter (Signed)
Pts wife Diane/DPR called stating that pt has had several episodes with blank stares, being unresponsive, Left leg tingling, missed his mouth 3 times when he went to drink. Please advise.

## 2018-05-17 NOTE — Telephone Encounter (Signed)
Left vm on patients wife cell phone to call back about husband having several episodes.

## 2018-05-17 NOTE — Telephone Encounter (Signed)
Left third message on wife home phone very detail. Message stated per Janett Billow NP she recommends pt seek the nearest emergency room for evaluation of stroke or seizure and being unresponsive at times. These are new sympts since last visit 05/2017.

## 2018-05-17 NOTE — Telephone Encounter (Signed)
I called patient that two vm were left on her home phone and cell phone to seek the nearest ED. I stated per Janett Billow NP these are new symptoms and he should be evaluated. The wife verbalized understanding.

## 2018-05-30 ENCOUNTER — Ambulatory Visit (INDEPENDENT_AMBULATORY_CARE_PROVIDER_SITE_OTHER): Payer: Medicare Other | Admitting: *Deleted

## 2018-05-30 DIAGNOSIS — I639 Cerebral infarction, unspecified: Secondary | ICD-10-CM

## 2018-05-30 LAB — CUP PACEART REMOTE DEVICE CHECK
Date Time Interrogation Session: 20200304145908
MDC IDC PG IMPLANT DT: 20170711

## 2018-06-06 DIAGNOSIS — M542 Cervicalgia: Secondary | ICD-10-CM | POA: Diagnosis not present

## 2018-06-06 DIAGNOSIS — Z139 Encounter for screening, unspecified: Secondary | ICD-10-CM | POA: Diagnosis not present

## 2018-06-06 DIAGNOSIS — M109 Gout, unspecified: Secondary | ICD-10-CM | POA: Diagnosis not present

## 2018-06-06 DIAGNOSIS — I10 Essential (primary) hypertension: Secondary | ICD-10-CM | POA: Diagnosis not present

## 2018-06-06 DIAGNOSIS — E782 Mixed hyperlipidemia: Secondary | ICD-10-CM | POA: Diagnosis not present

## 2018-06-07 NOTE — Progress Notes (Signed)
Carelink Summary Report / Loop Recorder 

## 2018-07-02 ENCOUNTER — Ambulatory Visit (INDEPENDENT_AMBULATORY_CARE_PROVIDER_SITE_OTHER): Payer: Medicare Other | Admitting: *Deleted

## 2018-07-02 ENCOUNTER — Other Ambulatory Visit: Payer: Self-pay

## 2018-07-02 DIAGNOSIS — I639 Cerebral infarction, unspecified: Secondary | ICD-10-CM

## 2018-07-02 LAB — CUP PACEART REMOTE DEVICE CHECK
Date Time Interrogation Session: 20200406164106
Implantable Pulse Generator Implant Date: 20170711

## 2018-07-11 NOTE — Progress Notes (Signed)
Carelink Summary Report / Loop Recorder 

## 2018-08-06 ENCOUNTER — Ambulatory Visit (INDEPENDENT_AMBULATORY_CARE_PROVIDER_SITE_OTHER): Payer: Medicare Other | Admitting: *Deleted

## 2018-08-06 ENCOUNTER — Other Ambulatory Visit: Payer: Self-pay

## 2018-08-06 DIAGNOSIS — I639 Cerebral infarction, unspecified: Secondary | ICD-10-CM

## 2018-08-06 LAB — CUP PACEART REMOTE DEVICE CHECK
Date Time Interrogation Session: 20200509174039
Implantable Pulse Generator Implant Date: 20170711

## 2018-08-15 NOTE — Progress Notes (Signed)
Carelink Summary Report / Loop Recorder 

## 2018-09-06 ENCOUNTER — Ambulatory Visit (INDEPENDENT_AMBULATORY_CARE_PROVIDER_SITE_OTHER): Payer: Medicare Other | Admitting: *Deleted

## 2018-09-06 DIAGNOSIS — I639 Cerebral infarction, unspecified: Secondary | ICD-10-CM

## 2018-09-07 LAB — CUP PACEART REMOTE DEVICE CHECK
Date Time Interrogation Session: 20200611181058
Implantable Pulse Generator Implant Date: 20170711

## 2018-09-11 DIAGNOSIS — M542 Cervicalgia: Secondary | ICD-10-CM | POA: Diagnosis not present

## 2018-09-11 DIAGNOSIS — I10 Essential (primary) hypertension: Secondary | ICD-10-CM | POA: Diagnosis not present

## 2018-09-11 DIAGNOSIS — Z79899 Other long term (current) drug therapy: Secondary | ICD-10-CM | POA: Diagnosis not present

## 2018-09-12 NOTE — Progress Notes (Signed)
Carelink Summary Report / Loop Recorder 

## 2018-09-21 DIAGNOSIS — H2513 Age-related nuclear cataract, bilateral: Secondary | ICD-10-CM | POA: Diagnosis not present

## 2018-09-21 DIAGNOSIS — H5203 Hypermetropia, bilateral: Secondary | ICD-10-CM | POA: Diagnosis not present

## 2018-10-09 ENCOUNTER — Ambulatory Visit (INDEPENDENT_AMBULATORY_CARE_PROVIDER_SITE_OTHER): Payer: Medicare Other | Admitting: *Deleted

## 2018-10-09 DIAGNOSIS — H40003 Preglaucoma, unspecified, bilateral: Secondary | ICD-10-CM | POA: Diagnosis not present

## 2018-10-09 DIAGNOSIS — I639 Cerebral infarction, unspecified: Secondary | ICD-10-CM

## 2018-10-09 DIAGNOSIS — H2513 Age-related nuclear cataract, bilateral: Secondary | ICD-10-CM | POA: Diagnosis not present

## 2018-10-09 LAB — CUP PACEART REMOTE DEVICE CHECK
Date Time Interrogation Session: 20200714180938
Implantable Pulse Generator Implant Date: 20170711

## 2018-10-17 DIAGNOSIS — I10 Essential (primary) hypertension: Secondary | ICD-10-CM | POA: Diagnosis not present

## 2018-10-17 DIAGNOSIS — M542 Cervicalgia: Secondary | ICD-10-CM | POA: Diagnosis not present

## 2018-10-17 DIAGNOSIS — Z87891 Personal history of nicotine dependence: Secondary | ICD-10-CM | POA: Diagnosis not present

## 2018-10-17 DIAGNOSIS — M269 Dentofacial anomaly, unspecified: Secondary | ICD-10-CM | POA: Diagnosis not present

## 2018-10-20 DIAGNOSIS — Z1159 Encounter for screening for other viral diseases: Secondary | ICD-10-CM | POA: Diagnosis not present

## 2018-10-22 DIAGNOSIS — E785 Hyperlipidemia, unspecified: Secondary | ICD-10-CM | POA: Diagnosis not present

## 2018-10-22 DIAGNOSIS — Z79899 Other long term (current) drug therapy: Secondary | ICD-10-CM | POA: Diagnosis not present

## 2018-10-22 DIAGNOSIS — I69398 Other sequelae of cerebral infarction: Secondary | ICD-10-CM | POA: Diagnosis not present

## 2018-10-22 DIAGNOSIS — Z7982 Long term (current) use of aspirin: Secondary | ICD-10-CM | POA: Diagnosis not present

## 2018-10-22 DIAGNOSIS — I10 Essential (primary) hypertension: Secondary | ICD-10-CM | POA: Diagnosis not present

## 2018-10-22 DIAGNOSIS — H2511 Age-related nuclear cataract, right eye: Secondary | ICD-10-CM | POA: Diagnosis not present

## 2018-10-22 DIAGNOSIS — E1136 Type 2 diabetes mellitus with diabetic cataract: Secondary | ICD-10-CM | POA: Diagnosis not present

## 2018-10-22 DIAGNOSIS — R531 Weakness: Secondary | ICD-10-CM | POA: Diagnosis not present

## 2018-10-22 DIAGNOSIS — Z85828 Personal history of other malignant neoplasm of skin: Secondary | ICD-10-CM | POA: Diagnosis not present

## 2018-10-22 DIAGNOSIS — Z880 Allergy status to penicillin: Secondary | ICD-10-CM | POA: Diagnosis not present

## 2018-10-23 DIAGNOSIS — H2511 Age-related nuclear cataract, right eye: Secondary | ICD-10-CM | POA: Diagnosis not present

## 2018-10-24 NOTE — Progress Notes (Signed)
Carelink Summary Report / Loop Recorder 

## 2018-10-26 ENCOUNTER — Telehealth: Payer: Self-pay

## 2018-10-26 NOTE — Telephone Encounter (Signed)
REFERRAL SENT TO [facility] [phone]

## 2018-11-03 DIAGNOSIS — Z1159 Encounter for screening for other viral diseases: Secondary | ICD-10-CM | POA: Diagnosis not present

## 2018-11-05 DIAGNOSIS — Z85828 Personal history of other malignant neoplasm of skin: Secondary | ICD-10-CM | POA: Diagnosis not present

## 2018-11-05 DIAGNOSIS — I69334 Monoplegia of upper limb following cerebral infarction affecting left non-dominant side: Secondary | ICD-10-CM | POA: Diagnosis not present

## 2018-11-05 DIAGNOSIS — Z79899 Other long term (current) drug therapy: Secondary | ICD-10-CM | POA: Diagnosis not present

## 2018-11-05 DIAGNOSIS — Z7982 Long term (current) use of aspirin: Secondary | ICD-10-CM | POA: Diagnosis not present

## 2018-11-05 DIAGNOSIS — H2512 Age-related nuclear cataract, left eye: Secondary | ICD-10-CM | POA: Diagnosis not present

## 2018-11-05 DIAGNOSIS — E1136 Type 2 diabetes mellitus with diabetic cataract: Secondary | ICD-10-CM | POA: Diagnosis not present

## 2018-11-05 DIAGNOSIS — I10 Essential (primary) hypertension: Secondary | ICD-10-CM | POA: Diagnosis not present

## 2018-11-05 DIAGNOSIS — E785 Hyperlipidemia, unspecified: Secondary | ICD-10-CM | POA: Diagnosis not present

## 2018-11-05 DIAGNOSIS — Z880 Allergy status to penicillin: Secondary | ICD-10-CM | POA: Diagnosis not present

## 2018-11-12 ENCOUNTER — Ambulatory Visit (INDEPENDENT_AMBULATORY_CARE_PROVIDER_SITE_OTHER): Payer: Medicare Other | Admitting: *Deleted

## 2018-11-12 DIAGNOSIS — I639 Cerebral infarction, unspecified: Secondary | ICD-10-CM

## 2018-11-12 LAB — CUP PACEART REMOTE DEVICE CHECK
Date Time Interrogation Session: 20200816183932
Implantable Pulse Generator Implant Date: 20170711

## 2018-11-15 ENCOUNTER — Telehealth: Payer: Self-pay | Admitting: Internal Medicine

## 2018-11-15 NOTE — Telephone Encounter (Signed)
New Message  Bobby Nguyen with [facility] is calling on behalf of patient. The patient is wanting to schedule to have his loop recorder removed. I see in the notes per [facility] "08.04.20 2:01pm-Called pt to let him know that Sonia Baller will be contacting him for an appt on Dr. Jackalyn Lombard next Delight day for linq extraction" Bobby Nguyen will a call back to discuss.

## 2018-11-21 NOTE — Progress Notes (Signed)
Carelink Summary Report / Loop Recorder 

## 2018-11-27 NOTE — Telephone Encounter (Signed)
Scheduled for 9/18.

## 2018-12-11 DIAGNOSIS — E782 Mixed hyperlipidemia: Secondary | ICD-10-CM | POA: Diagnosis not present

## 2018-12-11 DIAGNOSIS — I10 Essential (primary) hypertension: Secondary | ICD-10-CM | POA: Diagnosis not present

## 2018-12-11 DIAGNOSIS — M542 Cervicalgia: Secondary | ICD-10-CM | POA: Diagnosis not present

## 2018-12-11 DIAGNOSIS — M109 Gout, unspecified: Secondary | ICD-10-CM | POA: Diagnosis not present

## 2018-12-11 DIAGNOSIS — Z9181 History of falling: Secondary | ICD-10-CM | POA: Diagnosis not present

## 2018-12-11 DIAGNOSIS — Z23 Encounter for immunization: Secondary | ICD-10-CM | POA: Diagnosis not present

## 2018-12-14 ENCOUNTER — Encounter: Payer: Medicare Other | Admitting: Internal Medicine

## 2018-12-14 ENCOUNTER — Ambulatory Visit (INDEPENDENT_AMBULATORY_CARE_PROVIDER_SITE_OTHER): Payer: Medicare Other | Admitting: *Deleted

## 2018-12-14 DIAGNOSIS — I639 Cerebral infarction, unspecified: Secondary | ICD-10-CM | POA: Diagnosis not present

## 2018-12-15 LAB — CUP PACEART REMOTE DEVICE CHECK
Date Time Interrogation Session: 20200918183904
Implantable Pulse Generator Implant Date: 20170711

## 2018-12-17 NOTE — Progress Notes (Signed)
Carelink Summary Report / Loop Recorder 

## 2019-01-07 DIAGNOSIS — H9190 Unspecified hearing loss, unspecified ear: Secondary | ICD-10-CM | POA: Diagnosis not present

## 2019-01-07 DIAGNOSIS — H6120 Impacted cerumen, unspecified ear: Secondary | ICD-10-CM | POA: Diagnosis not present

## 2019-01-16 ENCOUNTER — Encounter: Payer: Medicare Other | Admitting: *Deleted

## 2019-01-24 DIAGNOSIS — Z9181 History of falling: Secondary | ICD-10-CM | POA: Diagnosis not present

## 2019-01-24 DIAGNOSIS — Z0000 Encounter for general adult medical examination without abnormal findings: Secondary | ICD-10-CM | POA: Diagnosis not present

## 2019-01-24 DIAGNOSIS — E785 Hyperlipidemia, unspecified: Secondary | ICD-10-CM | POA: Diagnosis not present

## 2019-02-05 ENCOUNTER — Telehealth: Payer: Self-pay

## 2019-02-05 ENCOUNTER — Telehealth: Payer: Self-pay | Admitting: Emergency Medicine

## 2019-02-05 NOTE — Telephone Encounter (Signed)
  The pt wife states he do not want to be monitored for his Loop recorder. The pt states he had it for 3 years and never had anything show up. I told the pt wife I will send a note to Dr. Rayann Heman and his nurse.

## 2019-02-05 NOTE — Telephone Encounter (Signed)
Wife (Diane ) called concerning remote monitoring for LINQ . Was billed 12/11/18 for August 25th transmission and wanted charges removed. Explained could not remove charges because transmission from 8/20 was processed. Patient wishes to discontinue monitoring of the device and does not want to have LINQ extraction. Monitor has been unplugged and will notify Dr Rayann Heman.

## 2019-03-06 DIAGNOSIS — C4441 Basal cell carcinoma of skin of scalp and neck: Secondary | ICD-10-CM | POA: Diagnosis not present

## 2019-03-06 DIAGNOSIS — L821 Other seborrheic keratosis: Secondary | ICD-10-CM | POA: Diagnosis not present

## 2019-03-06 DIAGNOSIS — C44329 Squamous cell carcinoma of skin of other parts of face: Secondary | ICD-10-CM | POA: Diagnosis not present

## 2019-03-06 DIAGNOSIS — C44622 Squamous cell carcinoma of skin of right upper limb, including shoulder: Secondary | ICD-10-CM | POA: Diagnosis not present

## 2019-03-06 DIAGNOSIS — L578 Other skin changes due to chronic exposure to nonionizing radiation: Secondary | ICD-10-CM | POA: Diagnosis not present

## 2019-03-12 DIAGNOSIS — Z79899 Other long term (current) drug therapy: Secondary | ICD-10-CM | POA: Diagnosis not present

## 2019-03-12 DIAGNOSIS — M542 Cervicalgia: Secondary | ICD-10-CM | POA: Diagnosis not present

## 2019-03-12 DIAGNOSIS — K5903 Drug induced constipation: Secondary | ICD-10-CM | POA: Diagnosis not present

## 2019-03-12 DIAGNOSIS — T402X5A Adverse effect of other opioids, initial encounter: Secondary | ICD-10-CM | POA: Diagnosis not present

## 2019-03-13 DIAGNOSIS — C44622 Squamous cell carcinoma of skin of right upper limb, including shoulder: Secondary | ICD-10-CM | POA: Diagnosis not present

## 2019-03-27 DIAGNOSIS — C44329 Squamous cell carcinoma of skin of other parts of face: Secondary | ICD-10-CM | POA: Diagnosis not present

## 2019-04-04 DIAGNOSIS — Z20822 Contact with and (suspected) exposure to covid-19: Secondary | ICD-10-CM | POA: Diagnosis not present

## 2019-04-25 DIAGNOSIS — C4441 Basal cell carcinoma of skin of scalp and neck: Secondary | ICD-10-CM | POA: Diagnosis not present

## 2019-06-17 DIAGNOSIS — R238 Other skin changes: Secondary | ICD-10-CM | POA: Diagnosis not present

## 2019-06-17 DIAGNOSIS — Z139 Encounter for screening, unspecified: Secondary | ICD-10-CM | POA: Diagnosis not present

## 2019-06-17 DIAGNOSIS — I10 Essential (primary) hypertension: Secondary | ICD-10-CM | POA: Diagnosis not present

## 2019-06-17 DIAGNOSIS — E782 Mixed hyperlipidemia: Secondary | ICD-10-CM | POA: Diagnosis not present

## 2019-06-17 DIAGNOSIS — M109 Gout, unspecified: Secondary | ICD-10-CM | POA: Diagnosis not present

## 2019-06-17 DIAGNOSIS — M542 Cervicalgia: Secondary | ICD-10-CM | POA: Diagnosis not present

## 2019-09-06 DIAGNOSIS — C44319 Basal cell carcinoma of skin of other parts of face: Secondary | ICD-10-CM | POA: Diagnosis not present

## 2019-09-06 DIAGNOSIS — D1801 Hemangioma of skin and subcutaneous tissue: Secondary | ICD-10-CM | POA: Diagnosis not present

## 2019-09-06 DIAGNOSIS — D225 Melanocytic nevi of trunk: Secondary | ICD-10-CM | POA: Diagnosis not present

## 2019-09-06 DIAGNOSIS — L578 Other skin changes due to chronic exposure to nonionizing radiation: Secondary | ICD-10-CM | POA: Diagnosis not present

## 2019-09-06 DIAGNOSIS — C4441 Basal cell carcinoma of skin of scalp and neck: Secondary | ICD-10-CM | POA: Diagnosis not present

## 2019-09-06 DIAGNOSIS — L821 Other seborrheic keratosis: Secondary | ICD-10-CM | POA: Diagnosis not present

## 2019-09-17 DIAGNOSIS — N3281 Overactive bladder: Secondary | ICD-10-CM | POA: Diagnosis not present

## 2019-09-17 DIAGNOSIS — Z79899 Other long term (current) drug therapy: Secondary | ICD-10-CM | POA: Diagnosis not present

## 2019-09-17 DIAGNOSIS — R05 Cough: Secondary | ICD-10-CM | POA: Diagnosis not present

## 2019-09-17 DIAGNOSIS — H6121 Impacted cerumen, right ear: Secondary | ICD-10-CM | POA: Diagnosis not present

## 2019-09-23 DIAGNOSIS — R05 Cough: Secondary | ICD-10-CM | POA: Diagnosis not present

## 2019-09-23 DIAGNOSIS — J479 Bronchiectasis, uncomplicated: Secondary | ICD-10-CM | POA: Diagnosis not present

## 2019-09-23 DIAGNOSIS — J8410 Pulmonary fibrosis, unspecified: Secondary | ICD-10-CM | POA: Diagnosis not present

## 2019-09-23 DIAGNOSIS — I700 Atherosclerosis of aorta: Secondary | ICD-10-CM | POA: Diagnosis not present

## 2019-09-23 DIAGNOSIS — E278 Other specified disorders of adrenal gland: Secondary | ICD-10-CM | POA: Diagnosis not present

## 2019-09-23 DIAGNOSIS — J90 Pleural effusion, not elsewhere classified: Secondary | ICD-10-CM | POA: Diagnosis not present

## 2019-09-23 DIAGNOSIS — J432 Centrilobular emphysema: Secondary | ICD-10-CM | POA: Diagnosis not present

## 2019-09-24 DIAGNOSIS — C44319 Basal cell carcinoma of skin of other parts of face: Secondary | ICD-10-CM | POA: Diagnosis not present

## 2019-10-08 DIAGNOSIS — C44329 Squamous cell carcinoma of skin of other parts of face: Secondary | ICD-10-CM | POA: Diagnosis not present

## 2019-12-20 DIAGNOSIS — I10 Essential (primary) hypertension: Secondary | ICD-10-CM | POA: Diagnosis not present

## 2019-12-20 DIAGNOSIS — Z79899 Other long term (current) drug therapy: Secondary | ICD-10-CM | POA: Diagnosis not present

## 2019-12-20 DIAGNOSIS — R634 Abnormal weight loss: Secondary | ICD-10-CM | POA: Diagnosis not present

## 2019-12-20 DIAGNOSIS — E782 Mixed hyperlipidemia: Secondary | ICD-10-CM | POA: Diagnosis not present

## 2019-12-20 DIAGNOSIS — M542 Cervicalgia: Secondary | ICD-10-CM | POA: Diagnosis not present

## 2019-12-20 DIAGNOSIS — M109 Gout, unspecified: Secondary | ICD-10-CM | POA: Diagnosis not present

## 2019-12-20 DIAGNOSIS — Z9181 History of falling: Secondary | ICD-10-CM | POA: Diagnosis not present

## 2019-12-20 DIAGNOSIS — Z23 Encounter for immunization: Secondary | ICD-10-CM | POA: Diagnosis not present

## 2020-01-08 DIAGNOSIS — C44319 Basal cell carcinoma of skin of other parts of face: Secondary | ICD-10-CM | POA: Diagnosis not present

## 2020-01-08 DIAGNOSIS — L570 Actinic keratosis: Secondary | ICD-10-CM | POA: Diagnosis not present

## 2020-01-08 DIAGNOSIS — B079 Viral wart, unspecified: Secondary | ICD-10-CM | POA: Diagnosis not present

## 2020-01-08 DIAGNOSIS — C44329 Squamous cell carcinoma of skin of other parts of face: Secondary | ICD-10-CM | POA: Diagnosis not present

## 2020-01-27 DIAGNOSIS — E785 Hyperlipidemia, unspecified: Secondary | ICD-10-CM | POA: Diagnosis not present

## 2020-01-27 DIAGNOSIS — Z9181 History of falling: Secondary | ICD-10-CM | POA: Diagnosis not present

## 2020-01-27 DIAGNOSIS — Z0000 Encounter for general adult medical examination without abnormal findings: Secondary | ICD-10-CM | POA: Diagnosis not present

## 2020-03-25 DIAGNOSIS — Z79899 Other long term (current) drug therapy: Secondary | ICD-10-CM | POA: Diagnosis not present

## 2020-03-25 DIAGNOSIS — M542 Cervicalgia: Secondary | ICD-10-CM | POA: Diagnosis not present

## 2020-03-25 DIAGNOSIS — Z6823 Body mass index (BMI) 23.0-23.9, adult: Secondary | ICD-10-CM | POA: Diagnosis not present

## 2020-06-30 DIAGNOSIS — I10 Essential (primary) hypertension: Secondary | ICD-10-CM | POA: Diagnosis not present

## 2020-06-30 DIAGNOSIS — J8410 Pulmonary fibrosis, unspecified: Secondary | ICD-10-CM | POA: Diagnosis not present

## 2020-06-30 DIAGNOSIS — J439 Emphysema, unspecified: Secondary | ICD-10-CM | POA: Diagnosis not present

## 2020-06-30 DIAGNOSIS — D692 Other nonthrombocytopenic purpura: Secondary | ICD-10-CM | POA: Diagnosis not present

## 2020-06-30 DIAGNOSIS — J479 Bronchiectasis, uncomplicated: Secondary | ICD-10-CM | POA: Diagnosis not present

## 2020-06-30 DIAGNOSIS — M542 Cervicalgia: Secondary | ICD-10-CM | POA: Diagnosis not present

## 2020-06-30 DIAGNOSIS — Z139 Encounter for screening, unspecified: Secondary | ICD-10-CM | POA: Diagnosis not present

## 2020-06-30 DIAGNOSIS — M109 Gout, unspecified: Secondary | ICD-10-CM | POA: Diagnosis not present

## 2020-06-30 DIAGNOSIS — N1831 Chronic kidney disease, stage 3a: Secondary | ICD-10-CM | POA: Diagnosis not present

## 2020-06-30 DIAGNOSIS — E782 Mixed hyperlipidemia: Secondary | ICD-10-CM | POA: Diagnosis not present

## 2020-06-30 DIAGNOSIS — I700 Atherosclerosis of aorta: Secondary | ICD-10-CM | POA: Diagnosis not present

## 2020-06-30 DIAGNOSIS — Z79899 Other long term (current) drug therapy: Secondary | ICD-10-CM | POA: Diagnosis not present

## 2020-08-04 DIAGNOSIS — M542 Cervicalgia: Secondary | ICD-10-CM | POA: Diagnosis not present

## 2020-09-14 DIAGNOSIS — M542 Cervicalgia: Secondary | ICD-10-CM | POA: Diagnosis not present

## 2020-09-14 DIAGNOSIS — H6123 Impacted cerumen, bilateral: Secondary | ICD-10-CM | POA: Diagnosis not present

## 2020-10-06 DIAGNOSIS — K5903 Drug induced constipation: Secondary | ICD-10-CM | POA: Diagnosis not present

## 2020-10-06 DIAGNOSIS — Z9889 Other specified postprocedural states: Secondary | ICD-10-CM | POA: Diagnosis not present

## 2020-10-06 DIAGNOSIS — Z0189 Encounter for other specified special examinations: Secondary | ICD-10-CM | POA: Diagnosis not present

## 2020-10-06 DIAGNOSIS — S22050S Wedge compression fracture of T5-T6 vertebra, sequela: Secondary | ICD-10-CM | POA: Diagnosis not present

## 2020-10-06 DIAGNOSIS — G8929 Other chronic pain: Secondary | ICD-10-CM | POA: Diagnosis not present

## 2020-10-06 DIAGNOSIS — T402X5A Adverse effect of other opioids, initial encounter: Secondary | ICD-10-CM | POA: Diagnosis not present

## 2020-11-03 DIAGNOSIS — Z79891 Long term (current) use of opiate analgesic: Secondary | ICD-10-CM | POA: Diagnosis not present

## 2020-11-03 DIAGNOSIS — G8921 Chronic pain due to trauma: Secondary | ICD-10-CM | POA: Diagnosis not present

## 2020-11-03 DIAGNOSIS — Z9889 Other specified postprocedural states: Secondary | ICD-10-CM | POA: Diagnosis not present

## 2020-11-03 DIAGNOSIS — Z760 Encounter for issue of repeat prescription: Secondary | ICD-10-CM | POA: Diagnosis not present

## 2020-11-03 DIAGNOSIS — M50322 Other cervical disc degeneration at C5-C6 level: Secondary | ICD-10-CM | POA: Diagnosis not present

## 2020-11-03 DIAGNOSIS — K5903 Drug induced constipation: Secondary | ICD-10-CM | POA: Diagnosis not present

## 2020-11-03 DIAGNOSIS — S22050S Wedge compression fracture of T5-T6 vertebra, sequela: Secondary | ICD-10-CM | POA: Diagnosis not present

## 2020-11-03 DIAGNOSIS — T402X5A Adverse effect of other opioids, initial encounter: Secondary | ICD-10-CM | POA: Diagnosis not present

## 2020-11-03 DIAGNOSIS — Z5181 Encounter for therapeutic drug level monitoring: Secondary | ICD-10-CM | POA: Diagnosis not present

## 2021-01-05 DIAGNOSIS — N1831 Chronic kidney disease, stage 3a: Secondary | ICD-10-CM | POA: Diagnosis not present

## 2021-01-05 DIAGNOSIS — E782 Mixed hyperlipidemia: Secondary | ICD-10-CM | POA: Diagnosis not present

## 2021-01-05 DIAGNOSIS — M109 Gout, unspecified: Secondary | ICD-10-CM | POA: Diagnosis not present

## 2021-01-05 DIAGNOSIS — D72829 Elevated white blood cell count, unspecified: Secondary | ICD-10-CM | POA: Diagnosis not present

## 2021-01-05 DIAGNOSIS — I10 Essential (primary) hypertension: Secondary | ICD-10-CM | POA: Diagnosis not present

## 2021-01-05 DIAGNOSIS — Z87891 Personal history of nicotine dependence: Secondary | ICD-10-CM | POA: Diagnosis not present

## 2021-01-21 DIAGNOSIS — F17210 Nicotine dependence, cigarettes, uncomplicated: Secondary | ICD-10-CM | POA: Diagnosis not present

## 2021-01-21 DIAGNOSIS — I700 Atherosclerosis of aorta: Secondary | ICD-10-CM | POA: Diagnosis not present

## 2021-01-21 DIAGNOSIS — Z122 Encounter for screening for malignant neoplasm of respiratory organs: Secondary | ICD-10-CM | POA: Diagnosis not present

## 2021-01-21 DIAGNOSIS — J432 Centrilobular emphysema: Secondary | ICD-10-CM | POA: Diagnosis not present

## 2021-01-28 DIAGNOSIS — Z9181 History of falling: Secondary | ICD-10-CM | POA: Diagnosis not present

## 2021-01-28 DIAGNOSIS — Z0000 Encounter for general adult medical examination without abnormal findings: Secondary | ICD-10-CM | POA: Diagnosis not present

## 2021-01-28 DIAGNOSIS — E785 Hyperlipidemia, unspecified: Secondary | ICD-10-CM | POA: Diagnosis not present

## 2021-02-02 DIAGNOSIS — Z79891 Long term (current) use of opiate analgesic: Secondary | ICD-10-CM | POA: Diagnosis not present

## 2021-02-02 DIAGNOSIS — T402X5A Adverse effect of other opioids, initial encounter: Secondary | ICD-10-CM | POA: Diagnosis not present

## 2021-02-02 DIAGNOSIS — K5903 Drug induced constipation: Secondary | ICD-10-CM | POA: Diagnosis not present

## 2021-02-02 DIAGNOSIS — G8921 Chronic pain due to trauma: Secondary | ICD-10-CM | POA: Diagnosis not present

## 2021-02-02 DIAGNOSIS — F1190 Opioid use, unspecified, uncomplicated: Secondary | ICD-10-CM | POA: Insufficient documentation

## 2021-02-02 DIAGNOSIS — Z9889 Other specified postprocedural states: Secondary | ICD-10-CM | POA: Diagnosis not present

## 2021-02-02 DIAGNOSIS — Z5181 Encounter for therapeutic drug level monitoring: Secondary | ICD-10-CM | POA: Diagnosis not present

## 2021-02-02 DIAGNOSIS — Z760 Encounter for issue of repeat prescription: Secondary | ICD-10-CM | POA: Diagnosis not present

## 2021-02-02 DIAGNOSIS — S22050S Wedge compression fracture of T5-T6 vertebra, sequela: Secondary | ICD-10-CM | POA: Diagnosis not present

## 2021-04-28 DIAGNOSIS — H9190 Unspecified hearing loss, unspecified ear: Secondary | ICD-10-CM | POA: Diagnosis not present

## 2021-04-28 DIAGNOSIS — H6120 Impacted cerumen, unspecified ear: Secondary | ICD-10-CM | POA: Diagnosis not present

## 2021-05-11 DIAGNOSIS — K5903 Drug induced constipation: Secondary | ICD-10-CM | POA: Diagnosis not present

## 2021-05-11 DIAGNOSIS — G8929 Other chronic pain: Secondary | ICD-10-CM | POA: Diagnosis not present

## 2021-05-11 DIAGNOSIS — T402X5A Adverse effect of other opioids, initial encounter: Secondary | ICD-10-CM | POA: Diagnosis not present

## 2021-05-11 DIAGNOSIS — M546 Pain in thoracic spine: Secondary | ICD-10-CM | POA: Diagnosis not present

## 2021-05-11 DIAGNOSIS — Z0289 Encounter for other administrative examinations: Secondary | ICD-10-CM | POA: Diagnosis not present

## 2021-07-12 DIAGNOSIS — L299 Pruritus, unspecified: Secondary | ICD-10-CM | POA: Diagnosis not present

## 2021-07-12 DIAGNOSIS — J439 Emphysema, unspecified: Secondary | ICD-10-CM | POA: Diagnosis not present

## 2021-07-12 DIAGNOSIS — Z139 Encounter for screening, unspecified: Secondary | ICD-10-CM | POA: Diagnosis not present

## 2021-07-12 DIAGNOSIS — N1831 Chronic kidney disease, stage 3a: Secondary | ICD-10-CM | POA: Diagnosis not present

## 2021-07-12 DIAGNOSIS — I700 Atherosclerosis of aorta: Secondary | ICD-10-CM | POA: Diagnosis not present

## 2021-07-12 DIAGNOSIS — J8410 Pulmonary fibrosis, unspecified: Secondary | ICD-10-CM | POA: Diagnosis not present

## 2021-07-12 DIAGNOSIS — J479 Bronchiectasis, uncomplicated: Secondary | ICD-10-CM | POA: Diagnosis not present

## 2021-07-12 DIAGNOSIS — I10 Essential (primary) hypertension: Secondary | ICD-10-CM | POA: Diagnosis not present

## 2021-07-12 DIAGNOSIS — E782 Mixed hyperlipidemia: Secondary | ICD-10-CM | POA: Diagnosis not present

## 2021-07-12 DIAGNOSIS — D692 Other nonthrombocytopenic purpura: Secondary | ICD-10-CM | POA: Diagnosis not present

## 2021-07-12 DIAGNOSIS — M109 Gout, unspecified: Secondary | ICD-10-CM | POA: Diagnosis not present

## 2021-08-03 DIAGNOSIS — Z720 Tobacco use: Secondary | ICD-10-CM | POA: Diagnosis not present

## 2021-08-03 DIAGNOSIS — K5903 Drug induced constipation: Secondary | ICD-10-CM | POA: Diagnosis not present

## 2021-08-03 DIAGNOSIS — M546 Pain in thoracic spine: Secondary | ICD-10-CM | POA: Diagnosis not present

## 2021-08-03 DIAGNOSIS — Z5181 Encounter for therapeutic drug level monitoring: Secondary | ICD-10-CM | POA: Diagnosis not present

## 2021-08-03 DIAGNOSIS — E663 Overweight: Secondary | ICD-10-CM | POA: Insufficient documentation

## 2021-08-03 DIAGNOSIS — G8929 Other chronic pain: Secondary | ICD-10-CM | POA: Diagnosis not present

## 2021-08-03 DIAGNOSIS — T402X5A Adverse effect of other opioids, initial encounter: Secondary | ICD-10-CM | POA: Diagnosis not present

## 2021-11-02 DIAGNOSIS — K5903 Drug induced constipation: Secondary | ICD-10-CM | POA: Diagnosis not present

## 2021-11-02 DIAGNOSIS — T402X5A Adverse effect of other opioids, initial encounter: Secondary | ICD-10-CM | POA: Diagnosis not present

## 2021-11-02 DIAGNOSIS — Z720 Tobacco use: Secondary | ICD-10-CM | POA: Diagnosis not present

## 2021-11-02 DIAGNOSIS — M546 Pain in thoracic spine: Secondary | ICD-10-CM | POA: Diagnosis not present

## 2021-11-02 DIAGNOSIS — G8929 Other chronic pain: Secondary | ICD-10-CM | POA: Diagnosis not present

## 2022-01-25 DIAGNOSIS — J439 Emphysema, unspecified: Secondary | ICD-10-CM | POA: Diagnosis not present

## 2022-01-25 DIAGNOSIS — M109 Gout, unspecified: Secondary | ICD-10-CM | POA: Diagnosis not present

## 2022-01-25 DIAGNOSIS — I10 Essential (primary) hypertension: Secondary | ICD-10-CM | POA: Diagnosis not present

## 2022-01-25 DIAGNOSIS — E782 Mixed hyperlipidemia: Secondary | ICD-10-CM | POA: Diagnosis not present

## 2022-01-25 DIAGNOSIS — D692 Other nonthrombocytopenic purpura: Secondary | ICD-10-CM | POA: Diagnosis not present

## 2022-01-25 DIAGNOSIS — Z87891 Personal history of nicotine dependence: Secondary | ICD-10-CM | POA: Diagnosis not present

## 2022-01-25 DIAGNOSIS — J069 Acute upper respiratory infection, unspecified: Secondary | ICD-10-CM | POA: Diagnosis not present

## 2022-01-25 DIAGNOSIS — N1831 Chronic kidney disease, stage 3a: Secondary | ICD-10-CM | POA: Diagnosis not present

## 2022-02-08 DIAGNOSIS — J441 Chronic obstructive pulmonary disease with (acute) exacerbation: Secondary | ICD-10-CM | POA: Diagnosis not present

## 2022-02-08 DIAGNOSIS — M542 Cervicalgia: Secondary | ICD-10-CM | POA: Diagnosis not present

## 2022-02-22 DIAGNOSIS — L814 Other melanin hyperpigmentation: Secondary | ICD-10-CM | POA: Diagnosis not present

## 2022-02-22 DIAGNOSIS — C44319 Basal cell carcinoma of skin of other parts of face: Secondary | ICD-10-CM | POA: Diagnosis not present

## 2022-02-22 DIAGNOSIS — C44212 Basal cell carcinoma of skin of right ear and external auricular canal: Secondary | ICD-10-CM | POA: Diagnosis not present

## 2022-02-22 DIAGNOSIS — L578 Other skin changes due to chronic exposure to nonionizing radiation: Secondary | ICD-10-CM | POA: Diagnosis not present

## 2022-03-04 DIAGNOSIS — J019 Acute sinusitis, unspecified: Secondary | ICD-10-CM | POA: Diagnosis not present

## 2022-03-04 DIAGNOSIS — R053 Chronic cough: Secondary | ICD-10-CM | POA: Diagnosis not present

## 2022-03-08 DIAGNOSIS — C44319 Basal cell carcinoma of skin of other parts of face: Secondary | ICD-10-CM | POA: Diagnosis not present

## 2022-03-08 DIAGNOSIS — C44212 Basal cell carcinoma of skin of right ear and external auricular canal: Secondary | ICD-10-CM | POA: Diagnosis not present

## 2022-03-09 DIAGNOSIS — Z87891 Personal history of nicotine dependence: Secondary | ICD-10-CM | POA: Diagnosis not present

## 2022-03-09 DIAGNOSIS — Z122 Encounter for screening for malignant neoplasm of respiratory organs: Secondary | ICD-10-CM | POA: Diagnosis not present

## 2022-03-10 DIAGNOSIS — Z1389 Encounter for screening for other disorder: Secondary | ICD-10-CM | POA: Diagnosis not present

## 2022-03-10 DIAGNOSIS — M549 Dorsalgia, unspecified: Secondary | ICD-10-CM | POA: Diagnosis not present

## 2022-03-10 DIAGNOSIS — Z79899 Other long term (current) drug therapy: Secondary | ICD-10-CM | POA: Diagnosis not present

## 2022-03-10 DIAGNOSIS — G894 Chronic pain syndrome: Secondary | ICD-10-CM | POA: Diagnosis not present

## 2022-04-13 DIAGNOSIS — M4850XA Collapsed vertebra, not elsewhere classified, site unspecified, initial encounter for fracture: Secondary | ICD-10-CM | POA: Diagnosis not present

## 2022-04-13 DIAGNOSIS — Z79899 Other long term (current) drug therapy: Secondary | ICD-10-CM | POA: Diagnosis not present

## 2022-04-13 DIAGNOSIS — G894 Chronic pain syndrome: Secondary | ICD-10-CM | POA: Diagnosis not present

## 2022-04-13 DIAGNOSIS — M549 Dorsalgia, unspecified: Secondary | ICD-10-CM | POA: Diagnosis not present

## 2022-05-11 DIAGNOSIS — Z79891 Long term (current) use of opiate analgesic: Secondary | ICD-10-CM | POA: Diagnosis not present

## 2022-05-11 DIAGNOSIS — G894 Chronic pain syndrome: Secondary | ICD-10-CM | POA: Diagnosis not present

## 2022-05-11 DIAGNOSIS — M549 Dorsalgia, unspecified: Secondary | ICD-10-CM | POA: Diagnosis not present

## 2022-05-27 DIAGNOSIS — C649 Malignant neoplasm of unspecified kidney, except renal pelvis: Secondary | ICD-10-CM

## 2022-05-27 HISTORY — DX: Malignant neoplasm of unspecified kidney, except renal pelvis: C64.9

## 2022-06-02 DIAGNOSIS — F17210 Nicotine dependence, cigarettes, uncomplicated: Secondary | ICD-10-CM | POA: Diagnosis not present

## 2022-06-02 DIAGNOSIS — I2510 Atherosclerotic heart disease of native coronary artery without angina pectoris: Secondary | ICD-10-CM | POA: Diagnosis not present

## 2022-06-02 DIAGNOSIS — Z122 Encounter for screening for malignant neoplasm of respiratory organs: Secondary | ICD-10-CM | POA: Diagnosis not present

## 2022-06-02 DIAGNOSIS — N2889 Other specified disorders of kidney and ureter: Secondary | ICD-10-CM | POA: Diagnosis not present

## 2022-06-02 DIAGNOSIS — J432 Centrilobular emphysema: Secondary | ICD-10-CM | POA: Diagnosis not present

## 2022-06-02 DIAGNOSIS — I700 Atherosclerosis of aorta: Secondary | ICD-10-CM | POA: Diagnosis not present

## 2022-06-06 DIAGNOSIS — M549 Dorsalgia, unspecified: Secondary | ICD-10-CM | POA: Diagnosis not present

## 2022-06-06 DIAGNOSIS — Z79891 Long term (current) use of opiate analgesic: Secondary | ICD-10-CM | POA: Diagnosis not present

## 2022-06-06 DIAGNOSIS — G894 Chronic pain syndrome: Secondary | ICD-10-CM | POA: Diagnosis not present

## 2022-06-06 DIAGNOSIS — M4850XA Collapsed vertebra, not elsewhere classified, site unspecified, initial encounter for fracture: Secondary | ICD-10-CM | POA: Diagnosis not present

## 2022-06-13 DIAGNOSIS — C642 Malignant neoplasm of left kidney, except renal pelvis: Secondary | ICD-10-CM | POA: Diagnosis not present

## 2022-06-13 DIAGNOSIS — N2889 Other specified disorders of kidney and ureter: Secondary | ICD-10-CM | POA: Diagnosis not present

## 2022-06-13 DIAGNOSIS — N281 Cyst of kidney, acquired: Secondary | ICD-10-CM | POA: Diagnosis not present

## 2022-06-20 DIAGNOSIS — D49512 Neoplasm of unspecified behavior of left kidney: Secondary | ICD-10-CM | POA: Diagnosis not present

## 2022-06-21 ENCOUNTER — Other Ambulatory Visit: Payer: Self-pay | Admitting: Urology

## 2022-06-24 ENCOUNTER — Encounter: Payer: Self-pay | Admitting: Pulmonary Disease

## 2022-06-24 ENCOUNTER — Ambulatory Visit: Payer: Medicare Other | Admitting: Pulmonary Disease

## 2022-06-24 ENCOUNTER — Telehealth: Payer: Self-pay | Admitting: Pulmonary Disease

## 2022-06-24 VITALS — BP 138/58 | HR 52 | Ht 68.0 in | Wt 157.8 lb

## 2022-06-24 DIAGNOSIS — R918 Other nonspecific abnormal finding of lung field: Secondary | ICD-10-CM

## 2022-06-24 DIAGNOSIS — R9389 Abnormal findings on diagnostic imaging of other specified body structures: Secondary | ICD-10-CM | POA: Diagnosis not present

## 2022-06-24 NOTE — Telephone Encounter (Signed)
Spoke to [facility] and he just wants one of our providers that will be at [facility] to do bronch next week if he is unable to do on Friday.  Advised him I will speak to Santiago Glad on Monday and will discuss with him after.  He will be in clinic on Monday.

## 2022-06-24 NOTE — H&P (View-Only) (Signed)
Bobby Nguyen    VR:[id number]    1946/02/07  Primary Care Physician:Conroy, Tamala Julian  Referring Physician: Leonides Sake, MD [address] [place],  [place] [address]  Chief complaint:   Patient being seen for an abnormal CT scan of the chest  HPI:  Patient recently had a CT scan of the chest showing progressive findings in the right lower lobe -Concern for an endobronchial process process  Also noted to have a kidney mass for which he is scheduled to have surgery April 12-concern for neoplastic process  Denies a chronic cough Denies history suggesting aspiration  Has been having low-dose CT scan as is an active smoker -Had a CT scan in December 2022, 1 was repeated in 2023 and in March 2024, this CAT scans were reviewed with the patient and his family showing a progressive infiltrate in the right lower lobe, some infiltrate also noted at the left base  He has a history of hypertension, hypercholesterolemia, history of a stroke  We will that for 53 years  Is a pack-a-day smoker  Feels relatively well otherwise  Has not lost any weight, her appetite is good  Outpatient Encounter Medications as of 06/24/2022  Medication Sig   allopurinol (ZYLOPRIM) 300 MG tablet Take 300 mg by mouth daily.   amLODipine (NORVASC) 10 MG tablet    atenolol (TENORMIN) 50 MG tablet Take 50 mg by mouth daily.   escitalopram (LEXAPRO) 10 MG tablet    fenofibrate 160 MG tablet Take 160 mg by mouth daily.   morphine (MS CONTIN) 15 MG 12 hr tablet Take 15 mg by mouth 2 (two) times daily.   aspirin 325 MG tablet Take 1 tablet (325 mg total) by mouth daily. (Patient not taking: Reported on 06/24/2022)   [DISCONTINUED] celecoxib (CELEBREX) 100 MG capsule TAKE 1 CAPSULE BY MOUTH TWICE DAILY FOR CHRONIC NECK/BACK PAIN (Patient not taking: Reported on 06/24/2022)   [DISCONTINUED] oxyCODONE-acetaminophen (PERCOCET/ROXICET) 5-325 MG tablet Take 1 tablet by mouth every 8 (eight) hours as needed  for severe pain. (Patient not taking: Reported on 06/24/2022)   No facility-administered encounter medications on file as of 06/24/2022.    Allergies as of 06/24/2022 - Review Complete 06/24/2022  Allergen Reaction Noted   Penicillins Itching, Swelling, and Rash 10/04/2015    Past Medical History:  Diagnosis Date   Back pain    Gout    Hyperlipidemia    Hypertension    Stroke [facility])     Past Surgical History:  Procedure Laterality Date   arm surgery     EP IMPLANTABLE DEVICE N/A 10/06/2015   Procedure: Loop Recorder Insertion;  Surgeon: Thompson Grayer, MD;  Location: [place] CV LAB;  Service: Cardiovascular;  Laterality: N/A;   TEE WITHOUT CARDIOVERSION N/A 10/08/2015   Procedure: TRANSESOPHAGEAL ECHOCARDIOGRAM (TEE);  Surgeon: Fay Records, MD;  Location: [facility] ENDOSCOPY;  Service: Cardiovascular;  Laterality: N/A;    History reviewed. No pertinent family history.  Social History   Socioeconomic History   Marital status: Married    Spouse name: Not on file   Number of children: Not on file   Years of education: Not on file   Highest education level: Not on file  Occupational History   Not on file  Tobacco Use   Smoking status: Every Day    Packs/day: 2    Types: Cigarettes   Smokeless tobacco: Never   Tobacco comments:    1 based on weather  Substance and Sexual Activity   Alcohol use: Yes    Alcohol/week: 4.0 standard drinks of alcohol    Types: 4 Cans of beer per week   Drug use: No   Sexual activity: Not on file  Other Topics Concern   Not on file  Social History Narrative   Not on file   Social Determinants of Health   Financial Resource Strain: Not on file  Food Insecurity: Not on file  Transportation Needs: Not on file  Physical Activity: Not on file  Stress: Not on file  Social Connections: Not on file  Intimate Partner Violence: Not on file    Review of Systems  Constitutional:  Negative for fatigue and fever.  Respiratory:  Negative for  cough.     Vitals:   06/24/22 1026  BP: (!) 138/58  Pulse: (!) 52  SpO2: 97%     Physical Exam Constitutional:      Appearance: Normal appearance.  HENT:     Head: Normocephalic.     Nose: Nose normal.     Mouth/Throat:     Mouth: Mucous membranes are moist.  Eyes:     General: No scleral icterus. Cardiovascular:     Rate and Rhythm: Normal rate and regular rhythm.     Heart sounds: No murmur heard.    No friction rub.  Pulmonary:     Effort: No respiratory distress.     Breath sounds: No stridor. No wheezing or rhonchi.  Musculoskeletal:     Cervical back: No rigidity or tenderness.  Neurological:     Mental Status: He is alert.  Psychiatric:        Mood and Affect: Mood normal.    Data Reviewed: Recent CT scans reviewed with the patient and his family  Most recent CT from March 2024 does show a progressive infiltrative process at the right base with possible endobronchial extension Renal mass  Assessment:  Concern for endobronchial mass lesion  Progressive right lower lobe atelectatic process  Concern for neoplastic process  Symptoms not suggestive of chronic aspiration  Plan/Recommendations: Will plan for bronchoscopy  A PET scan may be considered if it does not delay further evaluation  Patient already scheduled for I believe nephrectomy April 12  Will make plans to possibly have bronchoscopy performed first week of April  Even though patient feels quite healthy, will not want to expose him to 2 invasive interventions too close together  Concern is that there is a endobronchial process that is progressive, less likely retained secretions  Tentative follow-up in about 6 weeks  We will try and obtain a bronchoscopy as soon as possible   Sherrilyn Rist MD [facility] 06/24/2022, 5:23 PM  CC: Hamrick, Lorin Mercy, MD

## 2022-06-24 NOTE — Progress Notes (Signed)
Bobby Nguyen    VR:[id number]    [birth year]  Primary Care Physician:Conroy, Tamala Julian  Referring Physician: Leonides Sake, MD [address] [place],  [place] [address]  Chief complaint:   Patient being seen for an abnormal CT scan of the chest  HPI:  Patient recently had a CT scan of the chest showing progressive findings in the right lower lobe -Concern for an endobronchial process process  Also noted to have a kidney mass for which he is scheduled to have surgery April 12-concern for neoplastic process  Denies a chronic cough Denies history suggesting aspiration  Has been having low-dose CT scan as is an active smoker -Had a CT scan in December 2022, 1 was repeated in 2023 and in March 2024, this CAT scans were reviewed with the patient and his family showing a progressive infiltrate in the right lower lobe, some infiltrate also noted at the left base  He has a history of hypertension, hypercholesterolemia, history of a stroke  We will that for 53 years  Is a pack-a-day smoker  Feels relatively well otherwise  Has not lost any weight, her appetite is good  Outpatient Encounter Medications as of 06/24/2022  Medication Sig   allopurinol (ZYLOPRIM) 300 MG tablet Take 300 mg by mouth daily.   amLODipine (NORVASC) 10 MG tablet    atenolol (TENORMIN) 50 MG tablet Take 50 mg by mouth daily.   escitalopram (LEXAPRO) 10 MG tablet    fenofibrate 160 MG tablet Take 160 mg by mouth daily.   morphine (MS CONTIN) 15 MG 12 hr tablet Take 15 mg by mouth 2 (two) times daily.   aspirin 325 MG tablet Take 1 tablet (325 mg total) by mouth daily. (Patient not taking: Reported on 06/24/2022)   [DISCONTINUED] celecoxib (CELEBREX) 100 MG capsule TAKE 1 CAPSULE BY MOUTH TWICE DAILY FOR CHRONIC NECK/BACK PAIN (Patient not taking: Reported on 06/24/2022)   [DISCONTINUED] oxyCODONE-acetaminophen (PERCOCET/ROXICET) 5-325 MG tablet Take 1 tablet by mouth every 8 (eight) hours as needed  for severe pain. (Patient not taking: Reported on 06/24/2022)   No facility-administered encounter medications on file as of 06/24/2022.    Allergies as of 06/24/2022 - Review Complete 06/24/2022  Allergen Reaction Noted   Penicillins Itching, Swelling, and Rash 10/04/2015    Past Medical History:  Diagnosis Date   Back pain    Gout    Hyperlipidemia    Hypertension    Stroke [facility])     Past Surgical History:  Procedure Laterality Date   arm surgery     EP IMPLANTABLE DEVICE N/A 10/06/2015   Procedure: Loop Recorder Insertion;  Surgeon: Thompson Grayer, MD;  Location: [facility];  Service: Cardiovascular;  Laterality: N/A;   TEE WITHOUT CARDIOVERSION N/A 10/08/2015   Procedure: TRANSESOPHAGEAL ECHOCARDIOGRAM (TEE);  Surgeon: Fay Records, MD;  Location: [facility] ENDOSCOPY;  Service: Cardiovascular;  Laterality: N/A;    History reviewed. No pertinent family history.  Social History   Socioeconomic History   Marital status: Married    Spouse name: Not on file   Number of children: Not on file   Years of education: Not on file   Highest education level: Not on file  Occupational History   Not on file  Tobacco Use   Smoking status: Every Day    Packs/day: 2    Types: Cigarettes   Smokeless tobacco: Never   Tobacco comments:    1 based on weather  Substance and Sexual Activity   Alcohol use: Yes    Alcohol/week: 4.0 standard drinks of alcohol    Types: 4 Cans of beer per week   Drug use: No   Sexual activity: Not on file  Other Topics Concern   Not on file  Social History Narrative   Not on file   Social Determinants of Health   Financial Resource Strain: Not on file  Food Insecurity: Not on file  Transportation Needs: Not on file  Physical Activity: Not on file  Stress: Not on file  Social Connections: Not on file  Intimate Partner Violence: Not on file    Review of Systems  Constitutional:  Negative for fatigue and fever.  Respiratory:  Negative for  cough.     Vitals:   06/24/22 1026  BP: (!) 138/58  Pulse: (!) 52  SpO2: 97%     Physical Exam Constitutional:      Appearance: Normal appearance.  HENT:     Head: Normocephalic.     Nose: Nose normal.     Mouth/Throat:     Mouth: Mucous membranes are moist.  Eyes:     General: No scleral icterus. Cardiovascular:     Rate and Rhythm: Normal rate and regular rhythm.     Heart sounds: No murmur heard.    No friction rub.  Pulmonary:     Effort: No respiratory distress.     Breath sounds: No stridor. No wheezing or rhonchi.  Musculoskeletal:     Cervical back: No rigidity or tenderness.  Neurological:     Mental Status: He is alert.  Psychiatric:        Mood and Affect: Mood normal.    Data Reviewed: Recent CT scans reviewed with the patient and his family  Most recent CT from March 2024 does show a progressive infiltrative process at the right base with possible endobronchial extension Renal mass  Assessment:  Concern for endobronchial mass lesion  Progressive right lower lobe atelectatic process  Concern for neoplastic process  Symptoms not suggestive of chronic aspiration  Plan/Recommendations: Will plan for bronchoscopy  A PET scan may be considered if it does not delay further evaluation  Patient already scheduled for I believe nephrectomy April 12  Will make plans to possibly have bronchoscopy performed first week of April  Even though patient feels quite healthy, will not want to expose him to 2 invasive interventions too close together  Concern is that there is a endobronchial process that is progressive, less likely retained secretions  Tentative follow-up in about 6 weeks  We will try and obtain a bronchoscopy as soon as possible   Sherrilyn Rist MD [facility] 06/24/2022, 5:23 PM  CC: Hamrick, Lorin Mercy, MD

## 2022-06-24 NOTE — Patient Instructions (Signed)
We will try and schedule you for bronchoscopy  We will try for next week, if this is not possible- it will be after you are significantly improved from your surgery  Look up more information about a bronchoscopy  Try and set up MyChart  The concern is possible aspiration and you do not seem to have symptoms of this, a process in the airway that is causing some obstruction to that part of the lung, a chronic infection that is not very symptomatic may also present like this  We will try and schedule the bronchoscopy and let you know as soon as possible

## 2022-06-24 NOTE — Telephone Encounter (Signed)
Please schedule the following:  Provider performing procedure:Jimeka Balan, [place] physician- physician scheduled at [facility] Diagnosis: progressive right lower lobe infiltrate Which side if for nodule / mass?  Right lower lobe Procedure: Bronchoscopy Has patient been spoken to by Provider and given informed consent?  Patient agreeable with procedure Anesthesia: General Do you need Fluro?  Yes Duration of procedure: 35 to 45 minutes Date: Flexible, April 5 for [facility], April 1-4 for [facility] physician  Alternate Date: April 5 for [facility] Sl[facility], April 1-4 for physician at [facility] Time: AM/ PM Location: [facility] Does patient have OSA?  No DM?  No or Latex allergy, no Medication Restriction/ Anticoagulate/Antiplatelet: none Pre-op Labs Ordered:determined by Anesthesia Imaging request: CT in system  (If, [facility] CT Chest, please have STAT courier sent to ENDO)  Please coordinate Pre-op COVID Testing     Send me a message if questions

## 2022-06-24 NOTE — Telephone Encounter (Signed)
Called [facility] and spoke to [employer].  He states no openings for outpt's that he can see for next Friday but charge nurse may be able to move something.  He states Santiago Glad will be back on Monday.  Not sure what AO means by [facility] physician for Mon-Thurs.  Legrand Como doesn't see any openings for next week for outpt's.  Will clarify with AO and will call Santiago Glad on Monday.

## 2022-06-27 ENCOUNTER — Telehealth: Payer: Self-pay | Admitting: Student

## 2022-06-27 ENCOUNTER — Other Ambulatory Visit: Payer: Medicare Other

## 2022-06-27 ENCOUNTER — Other Ambulatory Visit: Payer: Self-pay | Admitting: Pulmonary Disease

## 2022-06-27 DIAGNOSIS — R918 Other nonspecific abnormal finding of lung field: Secondary | ICD-10-CM

## 2022-06-27 DIAGNOSIS — Z01812 Encounter for preprocedural laboratory examination: Secondary | ICD-10-CM

## 2022-06-27 NOTE — Telephone Encounter (Signed)
Do you know how I can find his 05/2022 ct chest?

## 2022-06-27 NOTE — Telephone Encounter (Signed)
Per Santiago Glad no availability next week at [facility] but NM can do on Thurs 4/4 at [facility] if no other cases lined up.  AO checked with NM and ok to do 4/4 and set up as navigational bronch and to schedule CT - no CT order, sent msg to AO I will need CT order.  Pt has been scheduled for 4/4 at 10:30.  Case # [id number].  Called pt to give him appt info and to ask him to come for covid test today.  He asked me to call back later and speak to his wife.  Gave him my phone # so she can call me and told him I would call back in a couple of hours if I don't hear from her.

## 2022-06-27 NOTE — Telephone Encounter (Signed)
Pt's son called and went over appt info with him.  He will bring pt to office today to get covid test.  AO has placed CT order - I sent message to Brandy/Shannon/Hayleigh/Elizabeth and made them aware pt will get CT morning of bronch.  Julious Oka has replied she will make a note.  Nothing further needed.

## 2022-06-27 NOTE — Patient Instructions (Signed)
DUE TO COVID-19 ONLY TWO VISITORS  (aged 77 and older)  ARE ALLOWED TO COME WITH YOU AND STAY IN THE WAITING ROOM ONLY DURING PRE OP AND PROCEDURE.   **NO VISITORS ARE ALLOWED IN THE SHORT STAY AREA OR RECOVERY ROOM!!**  IF YOU WILL BE ADMITTED INTO THE HOSPITAL YOU ARE ALLOWED ONLY FOUR SUPPORT PEOPLE DURING VISITATION HOURS ONLY (7 AM -8PM)   The support person(s) must pass our screening, gel in and out, and wear a mask at all times, including in the patient's room. Patients must also wear a mask when staff or their support person are in the room. Visitors GUEST BADGE MUST BE WORN VISIBLY  One adult visitor may remain with you overnight and MUST be in the room by 8 P.M.     Your procedure is scheduled on: 07/08/22   Report to [facility] Main Entrance    Report to admitting at : 10:15 AM   Call this number if you have problems the morning of surgery [phone]   Do not eat food :After Midnight.  FOLLOW BOWEL PREP AND ANY ADDITIONAL PRE OP INSTRUCTIONS YOU RECEIVED FROM YOUR SURGEON'S OFFICE!!!   Oral Hygiene is also important to reduce your risk of infection.                                    Remember - BRUSH YOUR TEETH THE MORNING OF SURGERY WITH YOUR REGULAR TOOTHPASTE  DENTURES WILL BE REMOVED PRIOR TO SURGERY PLEASE DO NOT APPLY "Poly grip" OR ADHESIVES!!!   Do NOT smoke after Midnight   Take these medicines the morning of surgery with A SIP OF WATER: escitalopram,atenolol,amlodipine,allopurinol.  DO NOT TAKE ANY ORAL DIABETIC MEDICATIONS DAY OF YOUR SURGERY  Bring CPAP mask and tubing day of surgery.                              You may not have any metal on your body including hair pins, jewelry, and body piercing             Do not wear lotions, powders, perfumes/cologne, or deodorant              Men may shave face and neck.   Do not bring valuables to the hospital. [place].   Contacts, glasses, or  bridgework may not be worn into surgery.   Bring small overnight bag day of surgery.   DO NOT [place]. PHARMACY WILL DISPENSE MEDICATIONS LISTED ON YOUR MEDICATION LIST TO YOU DURING YOUR ADMISSION [place]!    Patients discharged on the day of surgery will not be allowed to drive home.  Someone NEEDS to stay with you for the first 24 hours after anesthesia.   Special Instructions: Bring a copy of your healthcare power of attorney and living will documents         the day of surgery if you haven't scanned them before.              Please read over the following fact sheets you were given: IF YOU HAVE QUESTIONS ABOUT YOUR PRE-OP INSTRUCTIONS PLEASE CALL [phone]    [place] - Preparing for Surgery Before surgery, you can play an important  role.  Because skin is not sterile, your skin needs to be as free of germs as possible.  You can reduce the number of germs on your skin by washing with CHG (chlorahexidine gluconate) soap before surgery.  CHG is an antiseptic cleaner which kills germs and bonds with the skin to continue killing germs even after washing. Please DO NOT use if you have an allergy to CHG or antibacterial soaps.  If your skin becomes reddened/irritated stop using the CHG and inform your nurse when you arrive at Short Stay. Do not shave (including legs and underarms) for at least 48 hours prior to the first CHG shower.  You may shave your face/neck. Please follow these instructions carefully:  1.  Shower with CHG Soap the night before surgery and the  morning of Surgery.  2.  If you choose to wash your hair, wash your hair first as usual with your  normal  shampoo.  3.  After you shampoo, rinse your hair and body thoroughly to remove the  shampoo.                           4.  Use CHG as you would any other liquid soap.  You can apply chg directly  to the skin and wash                       Gently with a scrungie or clean  washcloth.  5.  Apply the CHG Soap to your body ONLY FROM THE NECK DOWN.   Do not use on face/ open                           Wound or open sores. Avoid contact with eyes, ears mouth and genitals (private parts).                       Wash face,  Genitals (private parts) with your normal soap.             6.  Wash thoroughly, paying special attention to the area where your surgery  will be performed.  7.  Thoroughly rinse your body with warm water from the neck down.  8.  DO NOT shower/wash with your normal soap after using and rinsing off  the CHG Soap.                9.  Pat yourself dry with a clean towel.            10.  Wear clean pajamas.            11.  Place clean sheets on your bed the night of your first shower and do not  sleep with pets. Day of Surgery : Do not apply any lotions/deodorants the morning of surgery.  Please wear clean clothes to the hospital/surgery center.  FAILURE TO FOLLOW THESE INSTRUCTIONS MAY RESULT IN THE CANCELLATION OF YOUR SURGERY PATIENT SIGNATURE_________________________________  NURSE SIGNATURE__________________________________  ________________________________________________________________________ WHAT IS A BLOOD TRANSFUSION? Blood Transfusion Information  A transfusion is the replacement of blood or some of its parts. Blood is made up of multiple cells which provide different functions. Red blood cells carry oxygen and are used for blood loss replacement. White blood cells fight against infection. Platelets control bleeding. Plasma helps clot blood. Other blood products are available for specialized needs, such as hemophilia or other clotting  disorders. BEFORE THE TRANSFUSION  Who gives blood for transfusions?  Healthy volunteers who are fully evaluated to make sure their blood is safe. This is blood bank blood. Transfusion therapy is the safest it has ever been in the practice of medicine. Before blood is taken from a donor, a complete history  is taken to make sure that person has no history of diseases nor engages in risky social behavior (examples are intravenous drug use or sexual activity with multiple partners). The donor's travel history is screened to minimize risk of transmitting infections, such as malaria. The donated blood is tested for signs of infectious diseases, such as HIV and hepatitis. The blood is then tested to be sure it is compatible with you in order to minimize the chance of a transfusion reaction. If you or a relative donates blood, this is often done in anticipation of surgery and is not appropriate for emergency situations. It takes many days to process the donated blood. RISKS AND COMPLICATIONS Although transfusion therapy is very safe and saves many lives, the main dangers of transfusion include:  Getting an infectious disease. Developing a transfusion reaction. This is an allergic reaction to something in the blood you were given. Every precaution is taken to prevent this. The decision to have a blood transfusion has been considered carefully by your caregiver before blood is given. Blood is not given unless the benefits outweigh the risks. AFTER THE TRANSFUSION Right after receiving a blood transfusion, you will usually feel much better and more energetic. This is especially true if your red blood cells have gotten low (anemic). The transfusion raises the level of the red blood cells which carry oxygen, and this usually causes an energy increase. The nurse administering the transfusion will monitor you carefully for complications. HOME CARE INSTRUCTIONS  No special instructions are needed after a transfusion. You may find your energy is better. Speak with your caregiver about any limitations on activity for underlying diseases you may have. SEEK MEDICAL CARE IF:  Your condition is not improving after your transfusion. You develop redness or irritation at the intravenous (IV) site. SEEK IMMEDIATE MEDICAL CARE  IF:  Any of the following symptoms occur over the next 12 hours: Shaking chills. You have a temperature by mouth above 102 F (38.9 C), not controlled by medicine. Chest, back, or muscle pain. People around you feel you are not acting correctly or are confused. Shortness of breath or difficulty breathing. Dizziness and fainting. You get a rash or develop hives. You have a decrease in urine output. Your urine turns a dark color or changes to pink, red, or brown. Any of the following symptoms occur over the next 10 days: You have a temperature by mouth above 102 F (38.9 C), not controlled by medicine. Shortness of breath. Weakness after normal activity. The white part of the eye turns yellow (jaundice). You have a decrease in the amount of urine or are urinating less often. Your urine turns a dark color or changes to pink, red, or brown. Document Released: 03/11/2000 Document Revised: 06/06/2011 Document Reviewed: 10/29/2007 [facility] Patient Information 2014 [place], [place].  _______________________________________________________________________

## 2022-06-29 ENCOUNTER — Encounter (HOSPITAL_COMMUNITY): Payer: Self-pay

## 2022-06-29 ENCOUNTER — Other Ambulatory Visit: Payer: Self-pay

## 2022-06-29 ENCOUNTER — Encounter (HOSPITAL_COMMUNITY)
Admission: RE | Admit: 2022-06-29 | Discharge: 2022-06-29 | Disposition: A | Discharge status: Home / Self Care | Payer: Medicare Other | Source: Ambulatory Visit | Attending: Urology | Admitting: Urology

## 2022-06-29 VITALS — BP 179/54 | HR 51 | Temp 98.0°F | Ht 68.0 in | Wt 156.5 lb

## 2022-06-29 DIAGNOSIS — I639 Cerebral infarction, unspecified: Secondary | ICD-10-CM | POA: Insufficient documentation

## 2022-06-29 DIAGNOSIS — R001 Bradycardia, unspecified: Secondary | ICD-10-CM | POA: Insufficient documentation

## 2022-06-29 DIAGNOSIS — Z01818 Encounter for other preprocedural examination: Secondary | ICD-10-CM | POA: Diagnosis not present

## 2022-06-29 HISTORY — DX: Malignant (primary) neoplasm, unspecified: C80.1

## 2022-06-29 HISTORY — DX: Chronic kidney disease, unspecified: N18.9

## 2022-06-29 LAB — BASIC METABOLIC PANEL
Anion gap: 7 (ref 5–15)
BUN: 19 mg/dL (ref 8–23)
CO2: 25 mmol/L (ref 22–32)
Calcium: 9.1 mg/dL (ref 8.9–10.3)
Chloride: 103 mmol/L (ref 98–111)
Creatinine, Ser: 1.21 mg/dL (ref 0.61–1.24)
GFR, Estimated: >60 mL/min (ref >60)
Glucose, Bld: 94 mg/dL (ref 70–99)
Potassium: 4.3 mmol/L (ref 3.5–5.1)
Sodium: 135 mmol/L (ref 135–145)

## 2022-06-29 LAB — CBC
HCT: 42.8 % (ref 39.0–52.0)
Hemoglobin: 14 g/dL (ref 13.0–17.0)
MCH: 31 pg (ref 26.0–34.0)
MCHC: 32.7 g/dL (ref 30.0–36.0)
MCV: 94.9 fL (ref 80.0–100.0)
Platelets: 244 10*3/uL (ref 150–400)
RBC: 4.51 MIL/uL (ref 4.22–5.81)
RDW: 14.8 % (ref 11.5–15.5)
WBC: 9.1 10*3/uL (ref 4.0–10.5)
nRBC: 0 % (ref 0.0–0.2)

## 2022-06-29 LAB — NOVEL CORONAVIRUS, NAA: SARS-CoV-2, NAA: NOT DETECTED

## 2022-06-29 NOTE — Progress Notes (Signed)
For Short Stay: [facility] appointment date:  Bowel Prep reminder:   For Anesthesia: PCP - Cyndi Bender: PA-C Cardiologist - N/A Pulmonologist: Dr. Evette Georges. LOV: 06/24/22 Chest x-ray -  EKG -  Stress Test -  ECHO - 2017 Cardiac Cath -  Pacemaker/ICD device last checked: Pacemaker orders received: Device Rep notified:  Spinal Cord Stimulator: N/A  Sleep Study -  CPAP -   Fasting Blood Sugar - N/A Checks Blood Sugar _____ times a day Date and result of last Hgb A1c-  Last dose of GLP1 agonist-  GLP1 instructions:   Last dose of SGLT-2 inhibitors-  SGLT-2 instructions:   Blood Thinner Instructions: Aspirin Instructions: Last Dose:  Activity level: Can go up a flight of stairs and activities of daily living without stopping and without chest pain and/or shortness of breath   Able to exercise without chest pain and/or shortness of breath  Anesthesia review: HTN,Smoker,pt. Has a loop recorder that has not been check since 2020,as per pt. He refused to go for battery exchange.  Patient denies shortness of breath, fever, cough and chest pain at PAT appointment   Patient verbalized understanding of instructions that were given to them at the PAT appointment. Patient was also instructed that they will need to review over the PAT instructions again at home before surgery.

## 2022-06-30 ENCOUNTER — Encounter (HOSPITAL_COMMUNITY): Payer: Self-pay | Admitting: Student

## 2022-06-30 ENCOUNTER — Ambulatory Visit (HOSPITAL_COMMUNITY)
Admission: RE | Admit: 2022-06-30 | Discharge: 2022-06-30 | Disposition: A | Discharge status: Home / Self Care | Payer: Medicare Other | Attending: Student | Admitting: Student

## 2022-06-30 ENCOUNTER — Ambulatory Visit (HOSPITAL_COMMUNITY): Payer: Medicare Other

## 2022-06-30 ENCOUNTER — Ambulatory Visit (HOSPITAL_COMMUNITY): Payer: Medicare Other | Admitting: Anesthesiology

## 2022-06-30 ENCOUNTER — Other Ambulatory Visit: Payer: Self-pay

## 2022-06-30 ENCOUNTER — Encounter (HOSPITAL_COMMUNITY)
Admission: RE | Disposition: A | Discharge status: Home / Self Care | Payer: Self-pay | Source: Home / Self Care | Attending: Student

## 2022-06-30 DIAGNOSIS — Z539 Procedure and treatment not carried out, unspecified reason: Secondary | ICD-10-CM | POA: Diagnosis not present

## 2022-06-30 DIAGNOSIS — Z09 Encounter for follow-up examination after completed treatment for conditions other than malignant neoplasm: Secondary | ICD-10-CM | POA: Diagnosis not present

## 2022-06-30 DIAGNOSIS — R942 Abnormal results of pulmonary function studies: Secondary | ICD-10-CM | POA: Insufficient documentation

## 2022-06-30 DIAGNOSIS — N2889 Other specified disorders of kidney and ureter: Secondary | ICD-10-CM | POA: Diagnosis not present

## 2022-06-30 DIAGNOSIS — Z8673 Personal history of transient ischemic attack (TIA), and cerebral infarction without residual deficits: Secondary | ICD-10-CM | POA: Insufficient documentation

## 2022-06-30 DIAGNOSIS — J984 Other disorders of lung: Secondary | ICD-10-CM | POA: Diagnosis not present

## 2022-06-30 DIAGNOSIS — E7800 Pure hypercholesterolemia, unspecified: Secondary | ICD-10-CM | POA: Insufficient documentation

## 2022-06-30 DIAGNOSIS — J439 Emphysema, unspecified: Secondary | ICD-10-CM | POA: Diagnosis not present

## 2022-06-30 DIAGNOSIS — I10 Essential (primary) hypertension: Secondary | ICD-10-CM | POA: Insufficient documentation

## 2022-06-30 DIAGNOSIS — F17210 Nicotine dependence, cigarettes, uncomplicated: Secondary | ICD-10-CM | POA: Insufficient documentation

## 2022-06-30 SURGERY — CANCELLED PROCEDURE
Anesthesia: General

## 2022-06-30 MED ORDER — LACTATED RINGERS IV SOLN: INTRAVENOUS | Status: DC

## 2022-06-30 MED ORDER — CHLORHEXIDINE GLUCONATE 0.12 % MT SOLN
OROMUCOSAL | Status: AC
  Administered 2022-06-30: 15 mL
  Filled 2022-06-30: qty 15

## 2022-06-30 NOTE — Interval H&P Note (Signed)
History and Physical Interval Note:  06/30/2022 8:16 AM  Bobby Nguyen  has presented today for surgery, with the diagnosis of RIGHT LOWER LOBE MASS.  The various methods of treatment have been discussed with the patient and family. After consideration of risks, benefits and other options for treatment, the patient has consented to  Procedure(s): ROBOTIC ASSISTED NAVIGATIONAL BRONCHOSCOPY (Right) as a surgical intervention.  The patient's history has been reviewed, patient examined, no change in status, stable for surgery.  I have reviewed the patient's chart and labs.  Questions were answered to the patient's satisfaction.     Maryjane Hurter

## 2022-06-30 NOTE — Anesthesia Preprocedure Evaluation (Signed)
Anesthesia Evaluation  Patient identified by MRN, date of birth, ID band Patient awake    Reviewed: Allergy & Precautions, H&P , NPO status , Patient's Chart, lab work & pertinent test results  Airway Mallampati: II  TM Distance: >3 FB Neck ROM: Full    Dental no notable dental hx.    Pulmonary Current Smoker   Pulmonary exam normal breath sounds clear to auscultation       Cardiovascular hypertension, Normal cardiovascular exam Rhythm:Regular Rate:Normal     Neuro/Psych TIACVA  negative psych ROS   GI/Hepatic negative GI ROS, Neg liver ROS,,,  Endo/Other  negative endocrine ROS    Renal/GU Renal InsufficiencyRenal disease  negative genitourinary   Musculoskeletal negative musculoskeletal ROS (+)    Abdominal   Peds negative pediatric ROS (+)  Hematology negative hematology ROS (+)   Anesthesia Other Findings   Reproductive/Obstetrics negative OB ROS                             Anesthesia Physical Anesthesia Plan  ASA: 3  Anesthesia Plan: General   Post-op Pain Management: Minimal or no pain anticipated   Induction: Intravenous  PONV Risk Score and Plan: 1 and Ondansetron and Treatment may vary due to age or medical condition  Airway Management Planned: Oral ETT  Additional Equipment:   Intra-op Plan:   Post-operative Plan: Extubation in OR  Informed Consent: I have reviewed the patients History and Physical, chart, labs and discussed the procedure including the risks, benefits and alternatives for the proposed anesthesia with the patient or authorized representative who has indicated his/her understanding and acceptance.     Dental advisory given  Plan Discussed with: CRNA and Surgeon  Anesthesia Plan Comments:        Anesthesia Quick Evaluation

## 2022-06-30 NOTE — Progress Notes (Signed)
Procedure cancelled per surgeon.

## 2022-06-30 NOTE — Op Note (Signed)
PCCM Brief Procedure Note  Interval clearance of bulk of RLL opacities. No need for navigation bronchoscopy. Bobby Nguyen will be discharged from pre-op. Called and discussed with pt's daughter.  [place]

## 2022-07-04 DIAGNOSIS — Z79891 Long term (current) use of opiate analgesic: Secondary | ICD-10-CM | POA: Diagnosis not present

## 2022-07-04 DIAGNOSIS — G894 Chronic pain syndrome: Secondary | ICD-10-CM | POA: Diagnosis not present

## 2022-07-04 DIAGNOSIS — M549 Dorsalgia, unspecified: Secondary | ICD-10-CM | POA: Diagnosis not present

## 2022-07-07 NOTE — H&P (Signed)
Office Visit Report     06/20/2022   --------------------------------------------------------------------------------   Bobby Nguyen. Netzel  MRN: [id number]  DOB: 11-08-1945, 76 year old Male  PRIMARY CARE:  Maura L. Hamrick, MD  PRIMARY CARE FAX:  [phone]  REFERRING:  Maura L. Hamrick, MD  PROVIDER:  Rhoderick Moody, M.D.  LOCATION:  [facility] [phone]     --------------------------------------------------------------------------------   CC/HPI: Renal mass   Bobby Nguyen is a 42 male with a solid and enhancing renal mass measuring 5.1 cm involving the lower pole of the LEFT kidney. The left renal mass was initially discovered on CT chest on 06/03/22 during routine lung cancer screening. He was found to have multiple suspicious pulmonary nodules concerning for RCC mets vs primary lung neoplasms (he has an appointment with [facility] to discuss biopsy).   -Anatomy: Atrophic left kidney with exophytic lower pole renal mass measuring 5.1 cm in greatest dimension. Single artery and vein. Healthy right kidney  -Personal/family history of GU malignancies: denies  -Smoking history: 1 ppd since he was 77 years old  -Prior abdominal surgeries: none  -Renal function: no data available  -History of kidney stones: denies     ALLERGIES: Penicillin    MEDICATIONS: Allopurinol 300 mg tablet  Amlodipine Besylate 10 mg tablet  Atenolol  Escitalopram Oxalate 10 mg tablet  Fenofibrate 160 mg tablet  Lubiprostone 24 mcg capsule  Morphine Sulfate 15 mg tablet  Prevagen  Stool Stofener     GU PSH: None   NON-GU PSH: Back Surgery (Unspecified) Hand/finger Surgery     GU PMH: None   NON-GU PMH: Gout Hypertension Stroke/TIA    FAMILY HISTORY: 1 Daughter - Daughter 1 son - Son   SOCIAL HISTORY: Marital Status: Married Preferred Language: English; Ethnicity: Not Hispanic Or Latino; Race: White Current Smoking Status: Patient smokes.   Tobacco Use  Assessment Completed: Used Tobacco in last 30 days? Types of alcohol consumed: Beer.  Drinks 1 caffeinated drink per day.    REVIEW OF SYSTEMS:    GU Review Male:   Patient reports frequent urination and get up at night to urinate. Patient denies hard to postpone urination, burning/ pain with urination, leakage of urine, stream starts and stops, trouble starting your stream, have to strain to urinate , erection problems, and penile pain.  Gastrointestinal (Upper):   Patient denies nausea, vomiting, and indigestion/ heartburn.  Gastrointestinal (Lower):   Patient denies diarrhea and constipation.  Constitutional:   Patient denies fever, night sweats, weight loss, and fatigue.  Skin:   Patient denies skin rash/ lesion and itching.  Eyes:   Patient denies blurred vision and double vision.  Ears/ Nose/ Throat:   Patient denies sore throat and sinus problems.  Hematologic/Lymphatic:   Patient denies swollen glands and easy bruising.  Cardiovascular:   Patient denies leg swelling and chest pains.  Respiratory:   Patient denies cough and shortness of breath.  Endocrine:   Patient denies excessive thirst.  Musculoskeletal:   Patient denies back pain and joint pain.  Neurological:   Patient denies headaches and dizziness.  Psychologic:   Patient denies depression and anxiety.   VITAL SIGNS:      06/20/2022 04:22 PM  Weight 156 lb / 70.76 kg  Height 68 in / 172.72 cm  BP 176/60 mmHg  Pulse 71 /min  Temperature 99.1 F / 37.2 C  BMI 23.7 kg/m   MULTI-SYSTEM PHYSICAL EXAMINATION:    Constitutional: Well-nourished. No physical deformities. Normally developed. Good  grooming.  Respiratory: No labored breathing, no use of accessory muscles.   Neurologic / Psychiatric: Oriented to time, oriented to place, oriented to person. No depression, no anxiety, no agitation.  Gastrointestinal: No mass, no tenderness, no rigidity, non obese abdomen.     Complexity of Data:  Records Review:   Previous  Hospital Records, Previous Patient Records  X-Ray Review: C.T. Abdomen/Pelvis: Reviewed Films. Reviewed Report. Discussed With Patient.  C.T. Chest: Reviewed Films. Reviewed Report. Discussed With Patient. CLINICAL DATA: Lung cancer screening. Fifty-five pack-year smoking  history. Current asymptomatic smoker.   EXAM:  CT CHEST WITHOUT CONTRAST LOW-DOSE FOR LUNG CANCER SCREENING   TECHNIQUE:  Multidetector CT imaging of the chest was performed following the  standard protocol without IV contrast.   RADIATION DOSE REDUCTION: This exam was performed according to the  departmental dose-optimization program which includes automated  exposure control, adjustment of the mA and/or kV according to  patient size and/or use of iterative reconstruction technique.   COMPARISON: 03/09/2022   FINDINGS:  Cardiovascular: Heart size is normal. No pericardial effusion.  Aortic atherosclerosis and coronary artery calcifications.   Mediastinum/Nodes: No enlarged mediastinal or axillary lymph nodes.  Thyroid gland, trachea, and esophagus demonstrate no significant  findings.   Lungs/Pleura: Paraseptal and centrilobular emphysema. Persistent  atelectasis and volume loss involving the posteromedial right lower  lobe multifocal areas of central airway filling defects, image  176/301. Consolidation and atelectasis within the lingula and left  lower lobe has nearly completely resolved with some residual areas  of subsegmental atelectasis or scarring in the posteromedial left  base. This area has a mean derived diameter of approximately 6.1 cm.   Upper Abdomen: There is no acute findings within the imaged portions  of the upper abdomen. 7 mm low-density structure within inferior  right lobe of liver is unchanged and likely represents a small cyst.  Indeterminate mass arising off the inferior pole of the left kidney  measures 4.4 cm and 32 Hounsfield units which does not meet  unenhanced CT criteria for  a simple cyst.   Musculoskeletal: No acute abnormality. Treated compression deformity  within the upper thoracic spine with associated kyphosis deformity  is stable from previous exam.   IMPRESSION:  1. Lung-RADS 4Bs, suspicious. Additional imaging evaluation or  consultation with Pulmonology or Thoracic Surgery recommended.  2. S modifier refers to the persistent and progressive consolidative  change with central airway occlusion involving the right lower lobe.  Although findings may reflect sequelae of residual  inflammation/infection, recurrent aspiration and/or mucous plugging,  an underlying endobronchial lesion cannot be excluded.  3. Indeterminate mass arising off the inferior pole of the left  kidney measures 4.4 cm and 32 Hounsfield units which does not meet  unenhanced CT criteria for a simple cyst. While this may represent a  complex proteinaceous or hemorrhagic cyst enhancing lesion cannot be  excluded. Recommend further evaluation with nonemergent, outpatient  renal mass protocol MRI or CT.  4. Coronary artery calcifications.  5. Aortic Atherosclerosis (ICD10-I70.0) and Emphysema (ICD10-J43.9).   These results will be called to the ordering clinician or  representative by the Radiologist Assistant, and communication  documented in the PACS or [employer].    Electronically Signed  By: Signa Kell M.D.  On: 06/03/2022 14:10   Notes:                     CLINICAL DATA: Renal mass on lung cancer screening CT. * Tracking  Code: BO *  EXAM:  CT ABDOMEN WITHOUT AND WITH CONTRAST   TECHNIQUE:  Multidetector CT imaging of the abdomen was performed following the  standard protocol before and following the bolus administration of  intravenous contrast.   RADIATION DOSE REDUCTION: This exam was performed according to the  departmental dose-optimization program which includes automated  exposure control, adjustment of the mA and/or kV according to  patient size  and/or use of iterative reconstruction technique.   CONTRAST: 100 cc Isovue 370.   COMPARISON: CT chest 06/02/2022.   FINDINGS:  Lower chest: Dependent atelectasis, right greater than left. Heart  is enlarged. No pericardial or pleural effusion. Distal esophagus is  grossly unremarkable.   Hepatobiliary: Blushes of hyperattenuation in the right hepatic lobe  may represent perfusion anomalies. Probable cyst in the inferior  right hepatic lobe. Liver and gallbladder are otherwise  unremarkable. No biliary ductal dilatation.   Pancreas: Negative.   Spleen: Negative.   Adrenals/Urinary Tract: Slight thickening of the adrenal glands, no  specific follow-up necessary. 11 mm hyperdense cyst in the upper  pole right kidney. Atrophic left kidney with a heterogeneous  enhancing mass in the lower pole, measuring 4.7 x 5.1 cm.   Stomach/Bowel: Mild gastric wall thickening. Stomach and visualized  portions of the small bowel and colon are otherwise unremarkable.   Vascular/Lymphatic: Atherosclerotic calcification of the aorta. No  pathologically enlarged lymph nodes.   Other: Mesenteries and peritoneum are unremarkable.   Musculoskeletal: Degenerative changes in the spine. No worrisome  lytic or sclerotic lesions.   IMPRESSION:  1. Left renal cell carcinoma. No evidence of metastatic disease.  2. Aortic atherosclerosis (ICD10-I70.0).    Electronically Signed  By: Leanna Battles M.D.  On: 06/14/2022 15:41   PROCEDURES:          Urinalysis w/Scope Dipstick Dipstick Cont'd Micro  Color: Yellow Bilirubin: Neg mg/dL WBC/hpf: 0 - 5/hpf  Appearance: Clear Ketones: Neg mg/dL RBC/hpf: 0 - 2/hpf  Specific Gravity: 1.025 Blood: Trace ery/uL Bacteria: Rare (0-9/hpf)  pH: 6.0 Protein: 3+ mg/dL Cystals: NS (Not Seen)  Glucose: Neg mg/dL Urobilinogen: 0.2 mg/dL Casts: NS (Not Seen)    Nitrites: Neg Trichomonas: Not Present    Leukocyte Esterase: Neg leu/uL Mucous: Present      Epithelial  Cells: 0 - 5/hpf      Yeast: NS (Not Seen)      Sperm: Present    ASSESSMENT:      ICD-10 Details  1 GU:   Left renal neoplasm - D49.512 Undiagnosed New Problem  2 NON-GU:   Other nonspecific abnormal finding of lung field - R91.8 Undiagnosed New Problem   PLAN:           Schedule Return Visit/Planned Activity: Next Available Appointment - Schedule Surgery          Document Letter(s):  Created for Patient: Clinical Summary         Notes:    -I reviewed imaging results and films with the patient personally. We discussed that the mass in question has features concerning for malignancy. I explained the natural history of presumed renal cell carcinoma. I reviewed the AUA guidelines for evaluation and treatment of renal masses. The options of active surveillance, in situ tumor ablation, partial and radical nephrectomy was discussed. The risks of robot-assisted laparoscopic LEFT radical nephrectomy were discussed in detail including but not limited to: negative pathology, open conversion, infection of the skin/abdominal cavity, VTE, MI/CVA, lymphatic leak, injury to adjacent solid/hollow viscus organs, bleeding requiring a blood transfusion,  catastrophic bleeding, hernia formation and other imponderables. The patient voices understanding and wishes to proceed.   -Will proceed with left nephrectomy (cytoreductive nephrectomy if he is found to have renal cell carcinoma pulmonary mets). If he is found to have primary lung cancer and surgery will delay treatment, will defer surgery until appropriate.     Interval:  Recent CT chest showed resolution of his chest lesions and he did not require a bronchscopy/lung biopsy

## 2022-07-08 ENCOUNTER — Inpatient Hospital Stay (HOSPITAL_COMMUNITY): Payer: Medicare Other | Admitting: Physician Assistant

## 2022-07-08 ENCOUNTER — Encounter (HOSPITAL_COMMUNITY)
Admission: RE | Disposition: A | Discharge status: Home / Self Care | Payer: Self-pay | Source: Home / Self Care | Attending: Urology

## 2022-07-08 ENCOUNTER — Encounter (HOSPITAL_COMMUNITY): Payer: Self-pay | Admitting: Urology

## 2022-07-08 ENCOUNTER — Other Ambulatory Visit: Payer: Self-pay

## 2022-07-08 ENCOUNTER — Inpatient Hospital Stay (HOSPITAL_COMMUNITY)
Admission: RE | Admit: 2022-07-08 | Discharge: 2022-07-10 | DRG: 658 | Disposition: A | Discharge status: Home / Self Care | Payer: Medicare Other | Attending: Urology | Admitting: Urology

## 2022-07-08 DIAGNOSIS — Z880 Allergy status to penicillin: Secondary | ICD-10-CM | POA: Diagnosis not present

## 2022-07-08 DIAGNOSIS — F17210 Nicotine dependence, cigarettes, uncomplicated: Secondary | ICD-10-CM | POA: Diagnosis not present

## 2022-07-08 DIAGNOSIS — R918 Other nonspecific abnormal finding of lung field: Secondary | ICD-10-CM | POA: Diagnosis not present

## 2022-07-08 DIAGNOSIS — M109 Gout, unspecified: Secondary | ICD-10-CM | POA: Diagnosis not present

## 2022-07-08 DIAGNOSIS — F17200 Nicotine dependence, unspecified, uncomplicated: Secondary | ICD-10-CM | POA: Diagnosis present

## 2022-07-08 DIAGNOSIS — N2889 Other specified disorders of kidney and ureter: Secondary | ICD-10-CM | POA: Diagnosis not present

## 2022-07-08 DIAGNOSIS — I129 Hypertensive chronic kidney disease with stage 1 through stage 4 chronic kidney disease, or unspecified chronic kidney disease: Secondary | ICD-10-CM | POA: Diagnosis present

## 2022-07-08 DIAGNOSIS — C642 Malignant neoplasm of left kidney, except renal pelvis: Secondary | ICD-10-CM | POA: Diagnosis not present

## 2022-07-08 DIAGNOSIS — N189 Chronic kidney disease, unspecified: Secondary | ICD-10-CM | POA: Diagnosis present

## 2022-07-08 DIAGNOSIS — E785 Hyperlipidemia, unspecified: Secondary | ICD-10-CM | POA: Diagnosis not present

## 2022-07-08 DIAGNOSIS — J439 Emphysema, unspecified: Secondary | ICD-10-CM | POA: Diagnosis not present

## 2022-07-08 DIAGNOSIS — Z1152 Encounter for screening for COVID-19: Secondary | ICD-10-CM

## 2022-07-08 DIAGNOSIS — Z8673 Personal history of transient ischemic attack (TIA), and cerebral infarction without residual deficits: Secondary | ICD-10-CM

## 2022-07-08 DIAGNOSIS — D49512 Neoplasm of unspecified behavior of left kidney: Secondary | ICD-10-CM | POA: Diagnosis present

## 2022-07-08 DIAGNOSIS — Z85828 Personal history of other malignant neoplasm of skin: Secondary | ICD-10-CM

## 2022-07-08 HISTORY — PX: ROBOT ASSISTED LAPAROSCOPIC NEPHRECTOMY: SHX5140

## 2022-07-08 LAB — ABO/RH: ABO/RH(D): A NEG

## 2022-07-08 LAB — TYPE AND SCREEN
ABO/RH(D): A NEG
Antibody Screen: NEGATIVE

## 2022-07-08 LAB — HEMOGLOBIN AND HEMATOCRIT, BLOOD
HCT: 44.4 % (ref 39.0–52.0)
Hemoglobin: 14.4 g/dL (ref 13.0–17.0)

## 2022-07-08 SURGERY — NEPHRECTOMY, RADICAL, ROBOT-ASSISTED, LAPAROSCOPIC, ADULT
Anesthesia: General | Laterality: Left

## 2022-07-08 MED ORDER — DIPHENHYDRAMINE HCL 12.5 MG/5ML PO ELIX: 12.5000 mg | ORAL_SOLUTION | Freq: Four times a day (QID) | ORAL | Status: DC | PRN

## 2022-07-08 MED ORDER — BUPIVACAINE LIPOSOME 1.3 % IJ SUSP
INTRAMUSCULAR | Status: DC | PRN
  Administered 2022-07-08: 20 mL

## 2022-07-08 MED ORDER — ROCURONIUM BROMIDE 10 MG/ML (PF) SYRINGE
PREFILLED_SYRINGE | INTRAVENOUS | Status: AC
  Filled 2022-07-08: qty 10

## 2022-07-08 MED ORDER — ACETAMINOPHEN 500 MG PO TABS
1000.0000 mg | ORAL_TABLET | Freq: Once | ORAL | Status: AC
  Administered 2022-07-08: 1000 mg via ORAL
  Filled 2022-07-08: qty 2

## 2022-07-08 MED ORDER — CLINDAMYCIN PHOSPHATE 900 MG/50ML IV SOLN
900.0000 mg | INTRAVENOUS | Status: AC
  Administered 2022-07-08: 900 mg via INTRAVENOUS
  Filled 2022-07-08: qty 50

## 2022-07-08 MED ORDER — MORPHINE SULFATE 15 MG PO TABS: 15.0000 mg | ORAL_TABLET | Freq: Two times a day (BID) | ORAL | 0 refills | Status: DC | PRN

## 2022-07-08 MED ORDER — CHLORHEXIDINE GLUCONATE 0.12 % MT SOLN
15.0000 mL | Freq: Once | OROMUCOSAL | Status: AC
  Administered 2022-07-08: 15 mL via OROMUCOSAL

## 2022-07-08 MED ORDER — ONDANSETRON HCL 4 MG/2ML IJ SOLN
INTRAMUSCULAR | Status: AC
  Filled 2022-07-08: qty 2

## 2022-07-08 MED ORDER — FENTANYL CITRATE PF 50 MCG/ML IJ SOSY
PREFILLED_SYRINGE | INTRAMUSCULAR | Status: AC
  Administered 2022-07-08: 25 ug via INTRAVENOUS
  Filled 2022-07-08: qty 1

## 2022-07-08 MED ORDER — EPHEDRINE 5 MG/ML INJ
INTRAVENOUS | Status: AC
  Filled 2022-07-08: qty 5

## 2022-07-08 MED ORDER — TRIPLE ANTIBIOTIC 3.5-400-5000 EX OINT: 1.0000 | TOPICAL_OINTMENT | Freq: Three times a day (TID) | CUTANEOUS | Status: DC | PRN

## 2022-07-08 MED ORDER — LACTATED RINGERS IV SOLN: INTRAVENOUS | Status: DC

## 2022-07-08 MED ORDER — PROPOFOL 10 MG/ML IV BOLUS
INTRAVENOUS | Status: AC
  Filled 2022-07-08: qty 20

## 2022-07-08 MED ORDER — SODIUM CHLORIDE (PF) 0.9 % IJ SOLN
INTRAMUSCULAR | Status: DC | PRN
  Administered 2022-07-08: 10 mL

## 2022-07-08 MED ORDER — ACETAMINOPHEN 500 MG PO TABS
1000.0000 mg | ORAL_TABLET | Freq: Four times a day (QID) | ORAL | Status: AC
  Administered 2022-07-08 – 2022-07-09 (×4): 1000 mg via ORAL
  Filled 2022-07-08 (×4): qty 2

## 2022-07-08 MED ORDER — ATENOLOL 50 MG PO TABS
50.0000 mg | ORAL_TABLET | Freq: Every day | ORAL | Status: DC
  Administered 2022-07-09 – 2022-07-10 (×2): 50 mg via ORAL
  Filled 2022-07-08 (×2): qty 1

## 2022-07-08 MED ORDER — KETAMINE HCL 10 MG/ML IJ SOLN
INTRAMUSCULAR | Status: DC | PRN
  Administered 2022-07-08: 20 mg via INTRAVENOUS

## 2022-07-08 MED ORDER — HYOSCYAMINE SULFATE 0.125 MG SL SUBL: 0.1250 mg | SUBLINGUAL_TABLET | SUBLINGUAL | Status: DC | PRN

## 2022-07-08 MED ORDER — FENTANYL CITRATE (PF) 100 MCG/2ML IJ SOLN
INTRAMUSCULAR | Status: AC
  Filled 2022-07-08: qty 2

## 2022-07-08 MED ORDER — APOAEQUORIN 10 MG PO CAPS: 10.0000 mg | ORAL_CAPSULE | Freq: Every day | ORAL | Status: DC

## 2022-07-08 MED ORDER — DEXAMETHASONE SODIUM PHOSPHATE 10 MG/ML IJ SOLN
INTRAMUSCULAR | Status: AC
  Filled 2022-07-08: qty 1

## 2022-07-08 MED ORDER — ESCITALOPRAM OXALATE 10 MG PO TABS
10.0000 mg | ORAL_TABLET | Freq: Every day | ORAL | Status: DC
  Administered 2022-07-09 – 2022-07-10 (×2): 10 mg via ORAL
  Filled 2022-07-08 (×2): qty 1

## 2022-07-08 MED ORDER — LIDOCAINE 2% (20 MG/ML) 5 ML SYRINGE
INTRAMUSCULAR | Status: DC | PRN
  Administered 2022-07-08: 60 mg via INTRAVENOUS

## 2022-07-08 MED ORDER — FENTANYL CITRATE PF 50 MCG/ML IJ SOSY
25.0000 ug | PREFILLED_SYRINGE | INTRAMUSCULAR | Status: DC | PRN
  Administered 2022-07-08: 50 ug via INTRAVENOUS
  Administered 2022-07-08: 25 ug via INTRAVENOUS

## 2022-07-08 MED ORDER — FENTANYL CITRATE (PF) 100 MCG/2ML IJ SOLN
INTRAMUSCULAR | Status: DC | PRN
  Administered 2022-07-08 (×2): 50 ug via INTRAVENOUS

## 2022-07-08 MED ORDER — BUPIVACAINE LIPOSOME 1.3 % IJ SUSP
INTRAMUSCULAR | Status: AC
  Filled 2022-07-08: qty 20

## 2022-07-08 MED ORDER — DEXAMETHASONE SODIUM PHOSPHATE 10 MG/ML IJ SOLN
INTRAMUSCULAR | Status: DC | PRN
  Administered 2022-07-08: 10 mg via INTRAVENOUS

## 2022-07-08 MED ORDER — LACTATED RINGERS IR SOLN
Status: DC | PRN
  Administered 2022-07-08: 1000 mL

## 2022-07-08 MED ORDER — SUGAMMADEX SODIUM 200 MG/2ML IV SOLN
INTRAVENOUS | Status: DC | PRN
  Administered 2022-07-08: 200 mg via INTRAVENOUS

## 2022-07-08 MED ORDER — AMISULPRIDE (ANTIEMETIC) 5 MG/2ML IV SOLN: 10.0000 mg | Freq: Once | INTRAVENOUS | Status: DC | PRN

## 2022-07-08 MED ORDER — FENTANYL CITRATE PF 50 MCG/ML IJ SOSY
PREFILLED_SYRINGE | INTRAMUSCULAR | Status: AC
  Administered 2022-07-08: 25 ug via INTRAVENOUS
  Filled 2022-07-08: qty 2

## 2022-07-08 MED ORDER — ALLOPURINOL 300 MG PO TABS
300.0000 mg | ORAL_TABLET | Freq: Every day | ORAL | Status: DC
  Administered 2022-07-09 – 2022-07-10 (×2): 300 mg via ORAL
  Filled 2022-07-08 (×2): qty 1

## 2022-07-08 MED ORDER — ONDANSETRON HCL 4 MG/2ML IJ SOLN: 4.0000 mg | INTRAMUSCULAR | Status: DC | PRN

## 2022-07-08 MED ORDER — KETAMINE HCL 50 MG/5ML IJ SOSY
PREFILLED_SYRINGE | INTRAMUSCULAR | Status: AC
  Filled 2022-07-08: qty 5

## 2022-07-08 MED ORDER — FENOFIBRATE 160 MG PO TABS
160.0000 mg | ORAL_TABLET | Freq: Every day | ORAL | Status: DC
  Administered 2022-07-09 – 2022-07-10 (×2): 160 mg via ORAL
  Filled 2022-07-08 (×2): qty 1

## 2022-07-08 MED ORDER — LIDOCAINE HCL (PF) 2 % IJ SOLN
INTRAMUSCULAR | Status: AC
  Filled 2022-07-08: qty 5

## 2022-07-08 MED ORDER — SODIUM CHLORIDE 0.45 % IV SOLN: INTRAVENOUS | Status: DC

## 2022-07-08 MED ORDER — EPHEDRINE SULFATE-NACL 50-0.9 MG/10ML-% IV SOSY
PREFILLED_SYRINGE | INTRAVENOUS | Status: DC | PRN
  Administered 2022-07-08: 5 mg via INTRAVENOUS

## 2022-07-08 MED ORDER — ONDANSETRON HCL 4 MG/2ML IJ SOLN
INTRAMUSCULAR | Status: DC | PRN
  Administered 2022-07-08: 4 mg via INTRAVENOUS

## 2022-07-08 MED ORDER — MORPHINE SULFATE 15 MG PO TABS
15.0000 mg | ORAL_TABLET | Freq: Two times a day (BID) | ORAL | Status: DC | PRN
  Administered 2022-07-09 (×2): 15 mg via ORAL
  Filled 2022-07-08 (×2): qty 1

## 2022-07-08 MED ORDER — SODIUM CHLORIDE (PF) 0.9 % IJ SOLN
INTRAMUSCULAR | Status: AC
  Filled 2022-07-08: qty 20

## 2022-07-08 MED ORDER — HYDROMORPHONE HCL 1 MG/ML IJ SOLN
0.5000 mg | INTRAMUSCULAR | Status: DC | PRN
  Administered 2022-07-08: 0.5 mg via INTRAVENOUS
  Filled 2022-07-08 (×2): qty 0.5

## 2022-07-08 MED ORDER — PROPOFOL 10 MG/ML IV BOLUS
INTRAVENOUS | Status: DC | PRN
  Administered 2022-07-08: 14 mg via INTRAVENOUS

## 2022-07-08 MED ORDER — SENNA 8.6 MG PO TABS
1.0000 | ORAL_TABLET | Freq: Every day | ORAL | Status: DC
  Administered 2022-07-08 – 2022-07-10 (×3): 8.6 mg via ORAL
  Filled 2022-07-08 (×3): qty 1

## 2022-07-08 MED ORDER — DIPHENHYDRAMINE HCL 50 MG/ML IJ SOLN: 12.5000 mg | Freq: Four times a day (QID) | INTRAMUSCULAR | Status: DC | PRN

## 2022-07-08 MED ORDER — LACTATED RINGERS IV SOLN: INTRAVENOUS | Status: DC | PRN

## 2022-07-08 MED ORDER — ROCURONIUM BROMIDE 10 MG/ML (PF) SYRINGE
PREFILLED_SYRINGE | INTRAVENOUS | Status: DC | PRN
  Administered 2022-07-08: 30 mg via INTRAVENOUS
  Administered 2022-07-08: 50 mg via INTRAVENOUS

## 2022-07-08 SURGICAL SUPPLY — 63 items
ADH SKN CLS APL DERMABOND .7 (GAUZE/BANDAGES/DRESSINGS) ×1
APL PRP STRL LF DISP 70% ISPRP (MISCELLANEOUS) ×1
BAG COUNTER SPONGE SURGICOUNT (BAG) IMPLANT
BAG LAPAROSCOPIC 12 15 PORT 16 (BASKET) ×1 IMPLANT
BAG RETRIEVAL 12/15 (BASKET) ×1
BAG SPNG CNTER NS LX DISP (BAG)
CAUTERY HOOK MNPLR 1.6 DVNC XI (INSTRUMENTS) ×1 IMPLANT
CHLORAPREP W/TINT 26 (MISCELLANEOUS) ×1 IMPLANT
CLIP LIGATING HEM O LOK PURPLE (MISCELLANEOUS) ×1 IMPLANT
CLIP LIGATING HEMO LOK XL GOLD (MISCELLANEOUS) ×1 IMPLANT
CLIP LIGATING HEMO O LOK GREEN (MISCELLANEOUS) IMPLANT
COVER SURGICAL LIGHT HANDLE (MISCELLANEOUS) ×1 IMPLANT
COVER TIP SHEARS 8 DVNC (MISCELLANEOUS) ×1 IMPLANT
CUTTER ECHEON FLEX ENDO 45 340 (ENDOMECHANICALS) IMPLANT
DERMABOND ADVANCED .7 DNX12 (GAUZE/BANDAGES/DRESSINGS) ×1 IMPLANT
DRAPE ARM DVNC X/XI (DISPOSABLE) ×4 IMPLANT
DRAPE COLUMN DVNC XI (DISPOSABLE) ×1 IMPLANT
DRAPE INCISE IOBAN 66X45 STRL (DRAPES) ×1 IMPLANT
DRAPE SHEET LG 3/4 BI-LAMINATE (DRAPES) ×1 IMPLANT
DRIVER NDL LRG 8 DVNC XI (INSTRUMENTS) ×2 IMPLANT
DRIVER NDLE LRG 8 DVNC XI (INSTRUMENTS) ×2 IMPLANT
ELECT PENCIL ROCKER SW 15FT (MISCELLANEOUS) ×1 IMPLANT
ELECT REM PT RETURN 15FT ADLT (MISCELLANEOUS) ×1 IMPLANT
FORCEPS PROGRASP DVNC XI (FORCEP) ×2 IMPLANT
GAUZE 4X4 16PLY ~~LOC~~+RFID DBL (SPONGE) IMPLANT
GLOVE BIO SURGEON STRL SZ 6.5 (GLOVE) ×1 IMPLANT
GLOVE BIOGEL PI IND STRL 8 (GLOVE) ×1 IMPLANT
GLOVE SURG LX STRL 7.5 STRW (GLOVE) ×2 IMPLANT
GOWN SRG XL LVL 4 BRTHBL STRL (GOWNS) ×1 IMPLANT
GOWN STRL NON-REIN XL LVL4 (GOWNS) ×2
GOWN STRL REUS W/ TWL XL LVL3 (GOWN DISPOSABLE) ×2 IMPLANT
GOWN STRL REUS W/TWL XL LVL3 (GOWN DISPOSABLE) ×2
HOLDER FOLEY CATH W/STRAP (MISCELLANEOUS) ×1 IMPLANT
IRRIG SUCT STRYKERFLOW 2 WTIP (MISCELLANEOUS) ×1
IRRIGATION SUCT STRKRFLW 2 WTP (MISCELLANEOUS) ×1 IMPLANT
KIT BASIN OR (CUSTOM PROCEDURE TRAY) ×1 IMPLANT
KIT TURNOVER KIT A (KITS) IMPLANT
MARKER SKIN DUAL TIP RULER LAB (MISCELLANEOUS) ×1 IMPLANT
NDL INSUFFLATION 14GA 120MM (NEEDLE) ×1 IMPLANT
NEEDLE INSUFFLATION 14GA 120MM (NEEDLE) ×1 IMPLANT
PROTECTOR NERVE ULNAR (MISCELLANEOUS) ×2 IMPLANT
RELOAD STAPLE 45 2.6 WHT THIN (STAPLE) IMPLANT
SCISSORS LAP 5X45 EPIX DISP (ENDOMECHANICALS) IMPLANT
SCISSORS MNPLR CVD DVNC XI (INSTRUMENTS) ×1 IMPLANT
SEAL UNIV 5-12 XI (MISCELLANEOUS) ×3 IMPLANT
SET TUBE SMOKE EVAC HIGH FLOW (TUBING) ×1 IMPLANT
SOL ELECTROSURG ANTI STICK (MISCELLANEOUS) ×1
SOLUTION ELECTROSURG ANTI STCK (MISCELLANEOUS) ×1 IMPLANT
SPIKE FLUID TRANSFER (MISCELLANEOUS) ×1 IMPLANT
STAPLE RELOAD 45 WHT (STAPLE) IMPLANT
STAPLE RELOAD 45MM WHITE (STAPLE)
SUT MNCRL AB 4-0 PS2 18 (SUTURE) ×2 IMPLANT
SUT PDS AB 0 CT1 36 (SUTURE) ×2 IMPLANT
SUT VIC AB 0 CT1 27 (SUTURE) ×2
SUT VIC AB 0 CT1 27XBRD ANTBC (SUTURE) ×1 IMPLANT
SUT VIC AB 2-0 SH 27 (SUTURE) ×1
SUT VIC AB 2-0 SH 27X BRD (SUTURE) ×1 IMPLANT
TOWEL OR 17X26 10 PK STRL BLUE (TOWEL DISPOSABLE) ×1 IMPLANT
TRAY FOLEY MTR SLVR 16FR STAT (SET/KITS/TRAYS/PACK) ×1 IMPLANT
TRAY LAPAROSCOPIC (CUSTOM PROCEDURE TRAY) ×1 IMPLANT
TROCAR Z THREAD OPTICAL 12X100 (TROCAR) ×1 IMPLANT
TROCAR Z-THREAD OPTICAL 5X100M (TROCAR) IMPLANT
WATER STERILE IRR 1000ML POUR (IV SOLUTION) ×1 IMPLANT

## 2022-07-08 NOTE — Op Note (Signed)
Operative Note  Preoperative diagnosis:  1.  5.1 left renal mass  Postoperative diagnosis: 1.  5.1 left renal mass   Procedure(s): 1.  Robot-assisted left radical nephrectomy (adrenal sparing)  Surgeon: Rhoderick Moody, MD  Assistants:  Harrie Foreman, PA-C  An assistant was required for this surgical procedure.  The duties of the assistant included but were not limited to suctioning, passing suture, camera manipulation, retraction.  This procedure would not be able to be performed without an Geophysicist/field seismologist.   Anesthesia:  General  Complications:  None  EBL:  10 mL   Specimens: 1. Left renal mass  Drains/Catheters: 1.  Foley catheter  Intraoperative findings:   Grossly negative surgical margins Left renal hilum was hemostatic at the conclusion of the case   Indication:  KINLEY Nguyen is a 77 y.o. male with a solid and enhancing 5.1 cm left renal mass with features concerning for renal cell carcinoma.  He has been consented for the above procedures, voices understanding and wishes to proceed.   Description of procedure:  After informed consent was obtained, the patient was brought to the operating room and general endotracheal anesthesia was administered.  The patient was then placed in the right lateral decubitus position and prepped and draped in usual sterile fashion.  A timeout was performed.  An 8 mm incision was then made lateral to the left rectus muscle at the level of the left 12th rib.  Abdominal access was obtained via a Veress needle.  The abdominal cavity was then insufflated up to 15 mmHg.  An 8 mm port was then introduced into the abdominal cavity.  Inspection of the port entry site by the robotic camera revealed no adjacent organ injury.  We then placed 3 additional 8 mm robotic ports to triangulate the left renal hilum.  A 12 mm assistant port was then placed between the carmera port and 3rd robotic arm.  The white line of Toldt along the descending colon was incised  sharply and the colon, along with its mesocolonic fat, was reflected medially until the aorta was identified.  We then made a small window adjacent to the lower pole of the left kidney, identifying the left psoas muscle, left ureter and left gonadal vein.  The left ureter and gonadal vein were then reflected anteriorly allowing [place] to then incised the perihilar attachments using electrocautery.  We encountered a small lumbar vein adjacent to the insertion of the left gonadal vein into the left renal vein.  This lumbar vein was ligated with hemo-lock clips in 2 places and incised sharply.  This provided [place] excellent exposure to the left renal hilum.    A 45 mm endovascular stapler was then used to ligate the left renal artery and then the left renal vein, achieving excellent hemostasis.  The remaining peri-renal attachments were then excised using a combination of blunt dissection and electrocautery.  The left adrenal gland was spared.  The endovascular stapler was then used to ligate the left gonadal vein and left ureter.  Once the kidney was freely mobile, it was placed in Endo Catch bag to be be retrieved at the conclusion of the case.  The robot was then de-docked.  A left lower quadrant Gibson incision was then made and the mass was removed within the Endo Catch bag.  The fascia within the midline assistant port was then closed using an interrupted 0 Vicryl suture.  The fascia of the internal and external oblique was then closed using a 0 PDS  suture in a running fashion.  The subcutaneous tissue within the [facility] incision was then closed using a running 0 Vicryl suture.  All skin incisions were then closed using 4-0 Monocryl and then dressed with Dermabond.  The patient tolerated the procedure well and was transferred to the postanesthesia in stable condition.    Plan:  Monitor on the floor overnight

## 2022-07-08 NOTE — Plan of Care (Signed)
  Problem: Education: Goal: Knowledge of the prescribed therapeutic regimen will improve Outcome: Progressing   Problem: Bowel/Gastric: Goal: Gastrointestinal status for postoperative course will improve Outcome: Progressing   Problem: Respiratory: Goal: Ability to achieve and maintain a regular respiratory rate will improve Outcome: Progressing   Problem: Urinary Elimination: Goal: Ability to achieve and maintain urine output will improve Outcome: Progressing

## 2022-07-08 NOTE — Transfer of Care (Signed)
Immediate Anesthesia Transfer of Care Note  Patient: Bobby Nguyen  Procedure(s) Performed: XI ROBOTIC ASSISTED LEFT LAPAROSCOPIC NEPHRECTOMY (Left)  Patient Location: PACU  Anesthesia Type:General  Level of Consciousness: sedated  Airway & Oxygen Therapy: Patient Spontanous Breathing and Patient connected to face mask oxygen  Post-op Assessment: Report given to RN and Post -op Vital signs reviewed and stable  Post vital signs: Reviewed and stable  Last Vitals:  Vitals Value Taken Time  BP 140/52 07/08/22 1431  Temp    Pulse 55 07/08/22 1433  Resp 15 07/08/22 1433  SpO2 100 % 07/08/22 1433  Vitals shown include unvalidated device data.  Last Pain:  Vitals:   07/08/22 0950  PainSc: 0-No pain         Complications: No notable events documented.

## 2022-07-08 NOTE — Anesthesia Preprocedure Evaluation (Addendum)
Anesthesia Evaluation  Patient identified by MRN, date of birth, ID band Patient awake    Reviewed: Allergy & Precautions, NPO status , Patient's Chart, lab work & pertinent test results  Airway Mallampati: II  TM Distance: >3 FB Neck ROM: Full    Dental  (+) Dental Advisory Given   Pulmonary Current Smoker and Patient abstained from smoking.   breath sounds clear to auscultation       Cardiovascular hypertension, Pt. on medications and Pt. on home beta blockers  Rhythm:Regular Rate:Normal     Neuro/Psych TIACVA    GI/Hepatic negative GI ROS, Neg liver ROS,,,  Endo/Other  negative endocrine ROS    Renal/GU CRFRenal disease     Musculoskeletal   Abdominal   Peds  Hematology negative hematology ROS (+)   Anesthesia Other Findings   Reproductive/Obstetrics                             Anesthesia Physical Anesthesia Plan  ASA: 3  Anesthesia Plan: General   Post-op Pain Management: Tylenol PO (pre-op)*   Induction: Intravenous  PONV Risk Score and Plan: 1 and Dexamethasone, Ondansetron and Treatment may vary due to age or medical condition  Airway Management Planned: Oral ETT and Video Laryngoscope Planned  Additional Equipment: None  Intra-op Plan:   Post-operative Plan: Extubation in OR  Informed Consent: I have reviewed the patients History and Physical, chart, labs and discussed the procedure including the risks, benefits and alternatives for the proposed anesthesia with the patient or authorized representative who has indicated his/her understanding and acceptance.     Dental advisory given  Plan Discussed with: CRNA  Anesthesia Plan Comments:        Anesthesia Quick Evaluation

## 2022-07-08 NOTE — Anesthesia Procedure Notes (Signed)
Procedure Name: Intubation Date/Time: 07/08/2022 12:35 PM  Performed by: Doran Clay, CRNAPre-anesthesia Checklist: Patient identified, Emergency Drugs available, Suction available, Patient being monitored and Timeout performed Patient Re-evaluated:Patient Re-evaluated prior to induction Oxygen Delivery Method: Circle system utilized Preoxygenation: Pre-oxygenation with 100% oxygen Induction Type: IV induction Ventilation: Mask ventilation without difficulty Laryngoscope Size: Mac and 4 Grade View: Grade I Tube type: Oral Tube size: 7.5 mm Number of attempts: 1 Airway Equipment and Method: Stylet Placement Confirmation: ETT inserted through vocal cords under direct vision, positive ETCO2 and breath sounds checked- equal and bilateral Secured at: 23 cm Tube secured with: Tape Dental Injury: Teeth and Oropharynx as per pre-operative assessment

## 2022-07-08 NOTE — Discharge Instructions (Signed)

## 2022-07-08 NOTE — Progress Notes (Signed)
Called 3 times but no answer.  Left voicemail on spouse's phone.  Surgery went routinely.

## 2022-07-09 LAB — HEMOGLOBIN AND HEMATOCRIT, BLOOD
HCT: 40.7 % (ref 39.0–52.0)
Hemoglobin: 13.5 g/dL (ref 13.0–17.0)

## 2022-07-09 LAB — BASIC METABOLIC PANEL
Anion gap: 10 (ref 5–15)
BUN: 17 mg/dL (ref 8–23)
CO2: 23 mmol/L (ref 22–32)
Calcium: 8.6 mg/dL — ABNORMAL LOW (ref 8.9–10.3)
Chloride: 102 mmol/L (ref 98–111)
Creatinine, Ser: 1.26 mg/dL — ABNORMAL HIGH (ref 0.61–1.24)
GFR, Estimated: 59 mL/min — ABNORMAL LOW (ref >60)
Glucose, Bld: 121 mg/dL — ABNORMAL HIGH (ref 70–99)
Potassium: 3.6 mmol/L (ref 3.5–5.1)
Sodium: 135 mmol/L (ref 135–145)

## 2022-07-09 MED ORDER — ORAL CARE MOUTH RINSE: 15.0000 mL | OROMUCOSAL | Status: DC | PRN

## 2022-07-09 MED ORDER — NICOTINE 21 MG/24HR TD PT24
21.0000 mg | MEDICATED_PATCH | Freq: Every day | TRANSDERMAL | Status: DC
  Administered 2022-07-09 – 2022-07-10 (×2): 21 mg via TRANSDERMAL
  Filled 2022-07-09 (×2): qty 1

## 2022-07-09 NOTE — Progress Notes (Signed)
1 Day Post-Op Subjective: Patient reports mild incisional pain. Negative flatus. Tolerating clears. Ambulating is halls without difficulty.  Objective: Vital signs in last 24 hours: Temp:  [97.5 F (36.4 C)-98.5 F (36.9 C)] 98.5 F (36.9 C) (04/13 0825) Pulse Rate:  [55-63] 61 (04/13 0825) Resp:  [11-19] 16 (04/13 0825) BP: (131-160)/(51-63) 131/60 (04/13 0825) SpO2:  [92 %-100 %] 95 % (04/13 0825) Weight:  [71 kg] 71 kg (04/12 1826)  Intake/Output from previous day: 04/12 0701 - 04/13 0700 In: 2515.3 [I.V.:2515.3] Out: 860 [Urine:850; Blood:10] Intake/Output this shift: Total I/O In: 913.1 [P.O.:480; I.V.:433.1] Out: 1000 [Urine:1000]  Physical Exam:  General:alert, cooperative, and appears stated age GI: soft, non tender, normal bowel sounds, no palpable masses, no organomegaly, no inguinal hernia Male genitalia: not done Extremities: extremities normal, atraumatic, no cyanosis or edema  Lab Results: Recent Labs    07/08/22 1502 07/09/22 0424  HGB 14.4 13.5  HCT 44.4 40.7   BMET Recent Labs    07/09/22 0424  NA 135  K 3.6  CL 102  CO2 23  GLUCOSE 121*  BUN 17  CREATININE 1.26*  CALCIUM 8.6*   No results for input(s): "LABPT", "INR" in the last 72 hours. No results for input(s): "LABURIN" in the last 72 hours. Results for orders placed or performed in visit on 06/27/22  Novel Coronavirus, NAA ([facility])     Status: None   Collection Time: 06/27/22 11:51 AM   Specimen: Nasopharyngeal(NP) swabs in vial transport medium   Nasopharynge  Resident  Result Value Ref Range Status   SARS-CoV-2, NAA Not Detected Not Detected Final    Comment: This nucleic acid amplification test was developed and its performance characteristics determined by [employer]. Nucleic acid amplification tests include RT-PCR and TMA. This test has not been FDA cleared or approved. This test has been authorized by FDA under an Emergency Use Authorization (EUA). This test is  only authorized for the duration of time the declaration that circumstances exist justifying the authorization of the emergency use of in vitro diagnostic tests for detection of SARS-CoV-2 virus and/or diagnosis of COVID-19 infection under section 564(b)(1) of the Act, 21 U.S.C. 060OKH-9(X) (1), unless the authorization is terminated or revoked sooner. When diagnostic testing is negative, the possibility of a false negative result should be considered in the context of a patient's recent exposures and the presence of clinical signs and symptoms consistent with COVID-19. An individual without symptoms of COVID-19 and who is not shedding SARS-CoV-2 virus wo uld expect to have a negative (not detected) result in this assay.     Studies/Results: No results found.  Assessment/Plan: POD#1 radical nephrectomy Continue current pain control regiment D/c foley Advance diet to regular Likely discharge tomorrow   LOS: 1 day   Wilkie Aye 07/09/2022, 1:10 PM

## 2022-07-10 MED ORDER — ACETAMINOPHEN 325 MG PO TABS
650.0000 mg | ORAL_TABLET | Freq: Four times a day (QID) | ORAL | Status: DC | PRN
  Administered 2022-07-10: 650 mg via ORAL
  Filled 2022-07-10: qty 2

## 2022-07-11 LAB — SURGICAL PATHOLOGY

## 2022-07-11 NOTE — Anesthesia Postprocedure Evaluation (Signed)
Anesthesia Post Note  Patient: Bobby Nguyen  Procedure(s) Performed: XI ROBOTIC ASSISTED LEFT LAPAROSCOPIC NEPHRECTOMY (Left)     Patient location during evaluation: PACU Anesthesia Type: General Level of consciousness: awake and alert Pain management: pain level controlled Vital Signs Assessment: post-procedure vital signs reviewed and stable Respiratory status: spontaneous breathing, nonlabored ventilation, respiratory function stable and patient connected to nasal cannula oxygen Cardiovascular status: blood pressure returned to baseline and stable Postop Assessment: no apparent nausea or vomiting Anesthetic complications: no   No notable events documented.  Last Vitals:  Vitals:   07/09/22 2043 07/10/22 0500  BP: (!) 150/58 (!) 159/68  Pulse: 66 69  Resp: 18 19  Temp: 36.8 C 37.2 C  SpO2: 97% 98%    Last Pain:  Vitals:   07/10/22 0928  TempSrc:   PainSc: 0-No pain                 Kennieth Rad

## 2022-07-12 ENCOUNTER — Encounter (HOSPITAL_COMMUNITY): Payer: Self-pay | Admitting: Urology

## 2022-07-12 NOTE — Discharge Summary (Signed)
Physician Discharge Summary  Patient ID: Bobby Nguyen MRN: [id number] DOB/AGE: 1945-12-24 77 y.o.  Admit date: 07/08/2022 Discharge date: 07/10/2022  Admission Diagnoses:  Renal mass  Discharge Diagnoses:  Principal Problem:   Renal mass   Past Medical History:  Diagnosis Date   Back pain    Cancer    skin cancer,right ear.   Chronic kidney disease    Gout    Hyperlipidemia    Hypertension    Stroke     Surgeries: Procedure(s): XI ROBOTIC ASSISTED LEFT LAPAROSCOPIC NEPHRECTOMY on 07/08/2022   Consultants (if any): Treatment Team:  Rene Paci, MD  Discharged Condition: Improved  Hospital Course: Bobby Nguyen is an 77 y.o. male who was admitted 07/08/2022 with a diagnosis of Renal mass and went to the operating room on 07/08/2022 and underwent the above named procedures.    He was given perioperative antibiotics:  Anti-infectives (From admission, onward)    Start     Dose/Rate Route Frequency Ordered Stop   07/08/22 0945  clindamycin (CLEOCIN) IVPB 900 mg        900 mg 100 mL/hr over 30 Minutes Intravenous 30 min pre-op 07/08/22 0938 07/08/22 1238     .  He was given sequential compression devices, early ambulation for DVT prophylaxis.  He benefited maximally from the hospital stay and there were no complications.    Recent vital signs:  Vitals:   07/09/22 2043 07/10/22 0500  BP: (!) 150/58 (!) 159/68  Pulse: 66 69  Resp: 18 19  Temp: 98.3 F (36.8 C) 99 F (37.2 C)  SpO2: 97% 98%    Recent laboratory studies:  Lab Results  Component Value Date   HGB 13.5 07/09/2022   HGB 14.4 07/08/2022   HGB 14.0 06/29/2022   Lab Results  Component Value Date   WBC 9.1 06/29/2022   PLT 244 06/29/2022   Lab Results  Component Value Date   INR 1.01 10/04/2015   Lab Results  Component Value Date   NA 135 07/09/2022   K 3.6 07/09/2022   CL 102 07/09/2022   CO2 23 07/09/2022   BUN 17 07/09/2022   CREATININE 1.26 (H) 07/09/2022   GLUCOSE  121 (H) 07/09/2022    Discharge Medications:   Allergies as of 07/10/2022       Reactions   Penicillins Itching, Swelling, Rash   Peripheral swelling        Medication List     TAKE these medications    allopurinol 300 MG tablet Commonly known as: ZYLOPRIM Take 300 mg by mouth daily.   amLODipine 10 MG tablet Commonly known as: NORVASC Take 10 mg by mouth daily.   atenolol 50 MG tablet Commonly known as: TENORMIN Take 50 mg by mouth daily.   docusate calcium 240 MG capsule Commonly known as: SURFAK Take 240 mg by mouth daily.   escitalopram 10 MG tablet Commonly known as: LEXAPRO Take 10 mg by mouth daily.   fenofibrate 160 MG tablet Take 160 mg by mouth daily.   morphine 15 MG tablet Commonly known as: MSIR Take 1 tablet (15 mg total) by mouth 2 (two) times daily as needed for severe pain.   Prevagen 10 MG Caps Generic drug: Apoaequorin Take 10 mg by mouth daily.   senna 8.6 MG Tabs tablet Commonly known as: SENOKOT Take 1 tablet by mouth daily.        Diagnostic Studies: CT Super D Chest Wo Contrast  Result Date: 06/30/2022 CLINICAL DATA:  Provided history: No contrast; post endo. Patient was scheduled for robotic assisted navigational bronchoscopy for a right lower lobe mass this morning. EXAM: CT CHEST WITHOUT CONTRAST TECHNIQUE: Multidetector CT imaging of the chest was performed using thin slice collimation for electromagnetic bronchoscopy planning purposes, without intravenous contrast. RADIATION DOSE REDUCTION: This exam was performed according to the departmental dose-optimization program which includes automated exposure control, adjustment of the mA and/or kV according to patient size and/or use of iterative reconstruction technique. COMPARISON:  Chest CT 06/02/2022 and 03/09/2022 FINDINGS: Cardiovascular: Extensive aortic atherosclerosis again noted with lesser involvement of great vessels and coronary arteries. The heart size is normal. There is  no pericardial effusion. Mediastinum/Nodes: There are no enlarged mediastinal, hilar or axillary lymph nodes.Scattered small mediastinal lymph nodes are unchanged. Hilar assessment is limited by the lack of intravenous contrast, although the hilar contours appear unchanged. The thyroid gland, trachea and esophagus demonstrate no significant findings. Lungs/Pleura: No pleural effusion or pneumothorax. Moderate centrilobular and paraseptal emphysema with scattered central airway thickening. Previously demonstrated patchy airspace disease posteriorly in the right lower lobe has largely cleared. There is mild residual volume loss and linear opacity, but no evidence of focal mass lesion or endobronchial lesion. Stable mild linear atelectasis or scarring medially in the left lower lobe. No suspicious pulmonary nodules are identified. Upper abdomen: The visualized upper abdomen appears stable without significant findings. Musculoskeletal/Chest wall: There is no chest wall mass or suspicious osseous finding. Stable chronic T6 compression deformity post spinal augmentation. There are chronic posttraumatic deformities within the posterior elements of the upper thoracic spine with possible chronic nonunion of spinous process fractures at T1 and T2. IMPRESSION: 1. Imaging for bronchoscopy planning and guidance. 2. Interval clearing of previously demonstrated patchy airspace disease posteriorly in the right lower lobe, consistent with resolved pneumonia. No evidence of focal mass lesion or endobronchial lesion. 3. Stable chronic T6 compression deformity status post spinal augmentation and chronic posttraumatic deformities of the upper thoracic spine. 4. Aortic Atherosclerosis (ICD10-I70.0) and Emphysema (ICD10-J43.9). Electronically Signed   By: Carey Bullocks M.D.   On: 06/30/2022 09:10    Disposition: Discharge disposition: 01-Home or Self Care          Follow-up Information     Rene Paci, MD  Follow up on 07/22/2022.   Specialty: Urology Why: at 10:45 Contact information: [address] [place] [address] [phone]                  Signed: Wilkie Aye 07/12/2022, 9:58 AM

## 2022-07-14 ENCOUNTER — Encounter (HOSPITAL_COMMUNITY): Payer: Medicare Other

## 2022-07-22 DIAGNOSIS — Z85528 Personal history of other malignant neoplasm of kidney: Secondary | ICD-10-CM | POA: Diagnosis not present

## 2022-07-22 DIAGNOSIS — C642 Malignant neoplasm of left kidney, except renal pelvis: Secondary | ICD-10-CM | POA: Diagnosis not present

## 2022-07-27 DIAGNOSIS — E782 Mixed hyperlipidemia: Secondary | ICD-10-CM | POA: Diagnosis not present

## 2022-07-27 DIAGNOSIS — Z139 Encounter for screening, unspecified: Secondary | ICD-10-CM | POA: Diagnosis not present

## 2022-07-27 DIAGNOSIS — H6122 Impacted cerumen, left ear: Secondary | ICD-10-CM | POA: Diagnosis not present

## 2022-07-27 DIAGNOSIS — J439 Emphysema, unspecified: Secondary | ICD-10-CM | POA: Diagnosis not present

## 2022-07-27 DIAGNOSIS — N1831 Chronic kidney disease, stage 3a: Secondary | ICD-10-CM | POA: Diagnosis not present

## 2022-07-27 DIAGNOSIS — M109 Gout, unspecified: Secondary | ICD-10-CM | POA: Diagnosis not present

## 2022-07-27 DIAGNOSIS — J8410 Pulmonary fibrosis, unspecified: Secondary | ICD-10-CM | POA: Diagnosis not present

## 2022-07-27 DIAGNOSIS — J479 Bronchiectasis, uncomplicated: Secondary | ICD-10-CM | POA: Diagnosis not present

## 2022-07-27 DIAGNOSIS — I700 Atherosclerosis of aorta: Secondary | ICD-10-CM | POA: Diagnosis not present

## 2022-07-27 DIAGNOSIS — I10 Essential (primary) hypertension: Secondary | ICD-10-CM | POA: Diagnosis not present

## 2022-07-27 DIAGNOSIS — C642 Malignant neoplasm of left kidney, except renal pelvis: Secondary | ICD-10-CM | POA: Diagnosis not present

## 2022-07-27 DIAGNOSIS — D692 Other nonthrombocytopenic purpura: Secondary | ICD-10-CM | POA: Diagnosis not present

## 2022-07-28 DIAGNOSIS — M549 Dorsalgia, unspecified: Secondary | ICD-10-CM | POA: Diagnosis not present

## 2022-07-28 DIAGNOSIS — G894 Chronic pain syndrome: Secondary | ICD-10-CM | POA: Diagnosis not present

## 2022-07-28 DIAGNOSIS — Z1389 Encounter for screening for other disorder: Secondary | ICD-10-CM | POA: Diagnosis not present

## 2022-07-28 DIAGNOSIS — Z5189 Encounter for other specified aftercare: Secondary | ICD-10-CM | POA: Diagnosis not present

## 2022-07-29 ENCOUNTER — Telehealth: Payer: Self-pay | Admitting: Internal Medicine

## 2022-08-16 ENCOUNTER — Ambulatory Visit: Payer: Medicare Other | Admitting: Internal Medicine

## 2022-08-16 ENCOUNTER — Other Ambulatory Visit: Payer: Medicare Other

## 2022-08-16 DIAGNOSIS — C642 Malignant neoplasm of left kidney, except renal pelvis: Secondary | ICD-10-CM | POA: Diagnosis not present

## 2022-09-05 DIAGNOSIS — Z1389 Encounter for screening for other disorder: Secondary | ICD-10-CM | POA: Diagnosis not present

## 2022-09-05 DIAGNOSIS — M4850XA Collapsed vertebra, not elsewhere classified, site unspecified, initial encounter for fracture: Secondary | ICD-10-CM | POA: Diagnosis not present

## 2022-09-05 DIAGNOSIS — M549 Dorsalgia, unspecified: Secondary | ICD-10-CM | POA: Diagnosis not present

## 2022-10-03 DIAGNOSIS — M549 Dorsalgia, unspecified: Secondary | ICD-10-CM | POA: Diagnosis not present

## 2022-10-03 DIAGNOSIS — G894 Chronic pain syndrome: Secondary | ICD-10-CM | POA: Diagnosis not present

## 2022-10-03 DIAGNOSIS — Z1389 Encounter for screening for other disorder: Secondary | ICD-10-CM | POA: Diagnosis not present

## 2022-10-03 DIAGNOSIS — Z79891 Long term (current) use of opiate analgesic: Secondary | ICD-10-CM | POA: Diagnosis not present

## 2022-10-03 DIAGNOSIS — M4850XA Collapsed vertebra, not elsewhere classified, site unspecified, initial encounter for fracture: Secondary | ICD-10-CM | POA: Diagnosis not present

## 2022-10-11 ENCOUNTER — Other Ambulatory Visit: Payer: Self-pay | Admitting: *Deleted

## 2022-10-11 DIAGNOSIS — N2889 Other specified disorders of kidney and ureter: Secondary | ICD-10-CM

## 2022-10-12 ENCOUNTER — Inpatient Hospital Stay: Payer: Medicare Other | Attending: Internal Medicine | Admitting: Internal Medicine

## 2022-10-12 ENCOUNTER — Inpatient Hospital Stay: Payer: Medicare Other

## 2022-10-12 ENCOUNTER — Other Ambulatory Visit: Payer: Self-pay

## 2022-10-12 ENCOUNTER — Encounter: Payer: Self-pay | Admitting: Internal Medicine

## 2022-10-12 VITALS — BP 132/58 | HR 56 | Temp 98.0°F | Resp 16 | Wt 159.9 lb

## 2022-10-12 DIAGNOSIS — F17210 Nicotine dependence, cigarettes, uncomplicated: Secondary | ICD-10-CM | POA: Insufficient documentation

## 2022-10-12 DIAGNOSIS — Z905 Acquired absence of kidney: Secondary | ICD-10-CM | POA: Diagnosis not present

## 2022-10-12 DIAGNOSIS — C642 Malignant neoplasm of left kidney, except renal pelvis: Secondary | ICD-10-CM | POA: Insufficient documentation

## 2022-10-12 DIAGNOSIS — N2889 Other specified disorders of kidney and ureter: Secondary | ICD-10-CM

## 2022-10-12 LAB — CBC WITH DIFFERENTIAL (CANCER CENTER ONLY)
Abs Immature Granulocytes: 0.04 10*3/uL (ref 0.00–0.07)
Basophils Absolute: 0.1 10*3/uL (ref 0.0–0.1)
Basophils Relative: 1 %
Eosinophils Absolute: 0.3 10*3/uL (ref 0.0–0.5)
Eosinophils Relative: 3 %
HCT: 40.9 % (ref 39.0–52.0)
Hemoglobin: 13.6 g/dL (ref 13.0–17.0)
Immature Granulocytes: 0 %
Lymphocytes Relative: 26 %
Lymphs Abs: 2.4 10*3/uL (ref 0.7–4.0)
MCH: 31.3 pg (ref 26.0–34.0)
MCHC: 33.3 g/dL (ref 30.0–36.0)
MCV: 94 fL (ref 80.0–100.0)
Monocytes Absolute: 1 10*3/uL (ref 0.1–1.0)
Monocytes Relative: 10 %
Neutro Abs: 5.8 10*3/uL (ref 1.7–7.7)
Neutrophils Relative %: 60 %
Platelet Count: 222 10*3/uL (ref 150–400)
RBC: 4.35 MIL/uL (ref 4.22–5.81)
RDW: 15 % (ref 11.5–15.5)
WBC Count: 9.6 10*3/uL (ref 4.0–10.5)
nRBC: 0 % (ref 0.0–0.2)

## 2022-10-12 LAB — CMP (CANCER CENTER ONLY)
ALT: 5 U/L (ref 0–44)
AST: 14 U/L — ABNORMAL LOW (ref 15–41)
Albumin: 4 g/dL (ref 3.5–5.0)
Alkaline Phosphatase: 48 U/L (ref 38–126)
Anion gap: 4 — ABNORMAL LOW (ref 5–15)
BUN: 28 mg/dL — ABNORMAL HIGH (ref 8–23)
CO2: 29 mmol/L (ref 22–32)
Calcium: 9.8 mg/dL (ref 8.9–10.3)
Chloride: 105 mmol/L (ref 98–111)
Creatinine: 1.57 mg/dL — ABNORMAL HIGH (ref 0.61–1.24)
GFR, Estimated: 45 mL/min — ABNORMAL LOW (ref >60)
Glucose, Bld: 79 mg/dL (ref 70–99)
Potassium: 5 mmol/L (ref 3.5–5.1)
Sodium: 138 mmol/L (ref 135–145)
Total Bilirubin: 0.6 mg/dL (ref 0.3–1.2)
Total Protein: 7.4 g/dL (ref 6.5–8.1)

## 2022-10-12 MED ORDER — PROCHLORPERAZINE MALEATE 10 MG PO TABS: 10.0000 mg | ORAL_TABLET | Freq: Four times a day (QID) | ORAL | 0 refills | Status: DC | PRN

## 2022-10-12 NOTE — Progress Notes (Signed)
START ON PATHWAY REGIMEN - Renal Cell     A cycle is every 21 days:     Pembrolizumab   **Always confirm dose/schedule in your pharmacy ordering system**  Patient Characteristics: Postoperative without Neoadjuvant Therapy, M0 (Pathologic Staging), Stage I, II, III, or Resected T4M0 Stage IV, Intermediate-High Risk Therapeutic Status: Postoperative without Neoadjuvant Therapy, M0 (Pathologic Staging) AJCC M Category: cM0 AJCC 8 Stage Grouping: III AJCC T Category: pT3a AJCC N Category: pN0 Risk Status: Intermediate-High Risk Intent of Therapy: Curative Intent, Discussed with Patient 

## 2022-10-12 NOTE — Progress Notes (Signed)
[facility] [phone]   Fax:([phone]  CONSULT NOTE  REFERRING PHYSICIAN: Dr. Rhoderick Moody  REASON FOR CONSULTATION:  77 years old white male with kidney cancer.  HPI Bobby Nguyen is a 77 y.o. male with past medical history significant for skin cancer, chronic kidney disease, gout, hypertension, dyslipidemia, stroke and chronic back pain as well as a recent diagnosis of left clear-cell renal cell carcinoma.  The patient has a long history for smoking and he was undergoing low-dose CT screening images at annual basis.  The last 1 was done on June 02, 2022 and that showed consolidation and atelectasis within the lingula and left lower lobe that nearly completely resolved with some residual areas of subsegmental atelectasis and scarring in the posterior medial left base in the area has immune drive diameter of approximately 6.1 cm.  Incidentally the scan showed indeterminate mass arising of the inferior pole of the left kidney measuring 4.4 cm and does not meet unenhanced head CT criteria for simple cyst. The patient had CT scan of the abdomen and pelvis with and without contrast and it showed atrophic left kidney with heterogeneous enhancing mass in the lower pole measuring 4.7 x 5.1 cm.  He was seen by Dr. Thora Lance for evaluation of the pulmonary nodule but CT super D of the chest on 06/30/2022 showed resolution of the suspicious lung nodule.  The patient was seen by Dr. Liliane Shi and on July 08, 2022 he underwent robotic assisted left radical nephrectomy with adrenal sparing.  The final pathology ([id number]) showed clear-cell renal cell carcinoma, nuclear grade 2 with size of 5.3 and 0.4 cm the tumor extends into renal vein (pT3a).  The ureteral, vascular and all margins of resection were negative of tumor.  The patient was referred to me today for evaluation and recommendation regarding his condition.  He was referred several weeks before but his family  canceled the appointment because they were not sure the purpose of seeing medical oncologist. When seen today he is feeling fine with no concerning complaints.  He denied having any nausea, vomiting, diarrhea or constipation.  He has no chest pain, shortness of breath, cough or hemoptysis.  He has no recent weight loss or night sweats.  He has no headache or visual changes. Family history significant for father with brain aneurysm and mother had colon cancer at age 74. The patient is married and has 2 children a son and daughter.  He was accompanied today by his wife Sedalia Muta and his daughter Efraim Kaufmann.  He used to work as a Psychologist, occupational.  He has a history of a smoking 1 pack/day for around 63 years and unfortunately he continues to smoke.  He has no history of alcohol or drug abuse.  HPI  Past Medical History:  Diagnosis Date   Back pain    Cancer (HCC)    skin cancer,right ear.   Chronic kidney disease    Gout    Hyperlipidemia    Hypertension    Stroke [facility])     Past Surgical History:  Procedure Laterality Date   arm surgery Left    fracture surgery.Plate and screws in.   CATARACT EXTRACTION W/ INTRAOCULAR LENS IMPLANT Bilateral    EP IMPLANTABLE DEVICE N/A 10/06/2015   Procedure: Loop Recorder Insertion;  Surgeon: Hillis Range, MD;  [facility]: [facility] INVASIVE [facility] LAB;  Service: Cardiovascular;  Laterality: N/A;   ROBOT ASSISTED LAPAROSCOPIC NEPHRECTOMY Left 07/08/2022   Procedure: XI ROBOTIC ASSISTED LEFT LAPAROSCOPIC NEPHRECTOMY;  Surgeon: Rene Paci, MD;  Location: [facility] ORS;  Service: Urology;  Laterality: Left;   TEE WITHOUT CARDIOVERSION N/A 10/08/2015   Procedure: TRANSESOPHAGEAL ECHOCARDIOGRAM (TEE);  Surgeon: Pricilla Riffle, MD;  Location: [facility] ENDOSCOPY;  Service: Cardiovascular;  Laterality: N/A;    No family history on file.  Social History Social History   Tobacco Use   Smoking status: Every Day    Current packs/day: 1.00    Types: Cigarettes   Smokeless tobacco: Never    Tobacco comments:    1 based on weather  Vaping Use   Vaping status: Never Used  Substance Use Topics   Alcohol use: Yes    Alcohol/week: 4.0 standard drinks of alcohol    Types: 4 Cans of beer per week    Comment: weekend   Drug use: No    Allergies  Allergen Reactions   Penicillins Itching, Swelling and Rash    Peripheral swelling    Current Outpatient Medications  Medication Sig Dispense Refill   allopurinol (ZYLOPRIM) 300 MG tablet Take 300 mg by mouth daily.     amLODipine (NORVASC) 10 MG tablet Take 10 mg by mouth daily.     Apoaequorin (PREVAGEN) 10 MG CAPS Take 10 mg by mouth daily.     atenolol (TENORMIN) 50 MG tablet Take 50 mg by mouth daily.     docusate calcium (SURFAK) 240 MG capsule Take 240 mg by mouth daily.     escitalopram (LEXAPRO) 10 MG tablet Take 10 mg by mouth daily.     fenofibrate 160 MG tablet Take 160 mg by mouth daily.     morphine (MSIR) 15 MG tablet Take 1 tablet (15 mg total) by mouth 2 (two) times daily as needed for severe pain. 10 tablet 0   senna (SENOKOT) 8.6 MG TABS tablet Take 1 tablet by mouth daily.     No current facility-administered medications for this visit.    Review of Systems  Constitutional: positive for fatigue Eyes: negative Ears, nose, mouth, throat, and face: negative Respiratory: negative Cardiovascular: negative Gastrointestinal: negative Genitourinary:negative Integument/breast: negative Hematologic/lymphatic: negative Musculoskeletal:negative Neurological: negative Behavioral/Psych: negative Endocrine: negative Allergic/Immunologic: negative  Physical Exam  GMW:NUUVO, healthy, no distress, well nourished, well developed, and anxious SKIN: skin color, texture, turgor are normal, no rashes or significant lesions HEAD: Normocephalic, No masses, lesions, tenderness or abnormalities EYES: normal, PERRLA, Conjunctiva are pink and non-injected EARS: External ears normal, Canals clear OROPHARYNX:no exudate,  no erythema, and lips, buccal mucosa, and tongue normal  NECK: supple, no adenopathy, no JVD LYMPH:  no palpable lymphadenopathy, no hepatosplenomegaly LUNGS: clear to auscultation , and palpation HEART: regular rate & rhythm, no murmurs, and no gallops ABDOMEN:abdomen soft and non-tender BACK: Back symmetric, no curvature., No CVA tenderness EXTREMITIES:no joint deformities, effusion, or inflammation, no edema  NEURO: alert & oriented x 3 with fluent speech, no focal motor/sensory deficits  PERFORMANCE STATUS: ECOG 1  LABORATORY DATA: Lab Results  Component Value Date   WBC 9.6 10/12/2022   HGB 13.6 10/12/2022   HCT 40.9 10/12/2022   MCV 94.0 10/12/2022   PLT 222 10/12/2022      Chemistry      Component Value Date/Time   NA 135 07/09/2022 0424   K 3.6 07/09/2022 0424   CL 102 07/09/2022 0424   CO2 23 07/09/2022 0424   BUN 17 07/09/2022 0424   CREATININE 1.26 (H) 07/09/2022 0424      Component Value Date/Time   CALCIUM 8.6 (L) 07/09/2022 0424  ALKPHOS 41 10/04/2015 1840   AST 20 10/04/2015 1840   ALT 8 (L) 10/04/2015 1840   BILITOT 0.6 10/04/2015 1840       RADIOGRAPHIC STUDIES: No results found.  ASSESSMENT: This is a very pleasant 77 years old white male recently diagnosed with a stage III (T3a, N0, MX) clear-cell renal cell carcinoma status post left radical nephrectomy with left adrenal sparing on July 08, 2022 under the care of Dr. Liliane Shi.   PLAN: I had a lengthy discussion with the patient and his family today about his current condition and treatment options. I explained to the patient that he already had curative treatment with the surgical resection. I discussed with the patient the role of adjuvant immunotherapy and explained to him that with a stage III renal cell carcinoma the current recommendation is consideration of 1 year treatment with pembrolizumab 200 Mg IV every 3 weeks for 1 year versus close monitoring and observation.  There was a disease  free survival benefit for patient who received the adjuvant immunotherapy. After discussion of the option and adverse effect of immunotherapy including but not limited to immunotherapy mediated skin rash, diarrhea, inflammation of the lung, kidney, liver, thyroid or other endocrine dysfunction, the patient and his family were interested in proceeding with the treatment with immunotherapy but they will have a low threshold to discontinue the treatment if he has a lot of side effects. He is expected to start the first dose of this treatment next week. I will see him back for follow-up visit in 4 weeks for evaluation with the start of cycle #2. The patient will have a chemotherapy education class before the first dose of his treatment. I will call his pharmacy with prescription for Compazine 10 mg p.o. every 6 hours as needed for nausea. The patient was advised to call immediately if he has any other concerning symptoms in the interval. The patient voices understanding of current disease status and treatment options and is in agreement with the current care plan.  All questions were answered. The patient knows to call the clinic with any problems, questions or concerns. We can certainly see the patient much sooner if necessary.  Thank you so much for allowing me to participate in the care of Kathlee Nations. I will continue to follow up the patient with you and assist in his care.  The total time spent in the appointment was 90 minutes.  Disclaimer: This note was dictated with voice recognition software. Similar sounding words can inadvertently be transcribed and may not be corrected upon review.   Lajuana Matte October 12, 2022, 11:25 AM

## 2022-10-13 ENCOUNTER — Other Ambulatory Visit: Payer: Self-pay

## 2022-10-13 ENCOUNTER — Telehealth: Payer: Self-pay | Admitting: Internal Medicine

## 2022-10-13 NOTE — Telephone Encounter (Signed)
Scheduled per 07/17 los, patient has been called and notified of upcoming appointments.

## 2022-10-17 ENCOUNTER — Other Ambulatory Visit: Payer: Self-pay | Admitting: Internal Medicine

## 2022-10-17 ENCOUNTER — Telehealth: Payer: Self-pay

## 2022-10-17 ENCOUNTER — Telehealth: Payer: Self-pay | Admitting: Internal Medicine

## 2022-10-17 NOTE — Telephone Encounter (Signed)
This nurse spoke with patients wife, Sedalia Muta,  who stated that patient has decided that he does not want to move forward with receiving treatment at this time. Patient and wife state that he just had a kidney removed and he just does not want to do cancer treatments at the present time.  This nurse advised that the provider will be made aware of patients decision.  No further questions or concerns noted at this time.

## 2022-10-18 NOTE — Telephone Encounter (Signed)
No changes made to this patient chart.  Chart review only.

## 2022-10-19 ENCOUNTER — Telehealth: Payer: Self-pay | Admitting: Internal Medicine

## 2022-10-19 NOTE — Telephone Encounter (Signed)
Patient declined receiving treatment and would like all future appointments to be cancelled.

## 2022-10-20 ENCOUNTER — Inpatient Hospital Stay: Payer: Medicare Other

## 2022-11-03 DIAGNOSIS — Z79899 Other long term (current) drug therapy: Secondary | ICD-10-CM | POA: Diagnosis not present

## 2022-11-03 DIAGNOSIS — M549 Dorsalgia, unspecified: Secondary | ICD-10-CM | POA: Diagnosis not present

## 2022-11-03 DIAGNOSIS — G894 Chronic pain syndrome: Secondary | ICD-10-CM | POA: Diagnosis not present

## 2022-11-10 ENCOUNTER — Other Ambulatory Visit: Payer: Medicare Other

## 2022-11-10 ENCOUNTER — Ambulatory Visit: Payer: Medicare Other

## 2022-11-10 ENCOUNTER — Ambulatory Visit: Payer: Medicare Other | Admitting: Internal Medicine

## 2022-11-14 DIAGNOSIS — Z85528 Personal history of other malignant neoplasm of kidney: Secondary | ICD-10-CM | POA: Diagnosis not present

## 2022-11-17 DIAGNOSIS — D49512 Neoplasm of unspecified behavior of left kidney: Secondary | ICD-10-CM | POA: Diagnosis not present

## 2022-11-17 DIAGNOSIS — J439 Emphysema, unspecified: Secondary | ICD-10-CM | POA: Diagnosis not present

## 2022-11-17 DIAGNOSIS — Z905 Acquired absence of kidney: Secondary | ICD-10-CM | POA: Diagnosis not present

## 2022-11-17 DIAGNOSIS — D1803 Hemangioma of intra-abdominal structures: Secondary | ICD-10-CM | POA: Diagnosis not present

## 2022-11-17 DIAGNOSIS — I700 Atherosclerosis of aorta: Secondary | ICD-10-CM | POA: Diagnosis not present

## 2022-11-21 DIAGNOSIS — C642 Malignant neoplasm of left kidney, except renal pelvis: Secondary | ICD-10-CM | POA: Diagnosis not present

## 2022-11-30 DIAGNOSIS — Z1211 Encounter for screening for malignant neoplasm of colon: Secondary | ICD-10-CM | POA: Diagnosis not present

## 2022-11-30 DIAGNOSIS — Z9181 History of falling: Secondary | ICD-10-CM | POA: Diagnosis not present

## 2022-11-30 DIAGNOSIS — Z0000 Encounter for general adult medical examination without abnormal findings: Secondary | ICD-10-CM | POA: Diagnosis not present

## 2022-11-30 DIAGNOSIS — Z139 Encounter for screening, unspecified: Secondary | ICD-10-CM | POA: Diagnosis not present

## 2022-12-01 ENCOUNTER — Ambulatory Visit: Payer: Medicare Other

## 2022-12-01 ENCOUNTER — Ambulatory Visit: Payer: Medicare Other | Admitting: Physician Assistant

## 2022-12-01 ENCOUNTER — Other Ambulatory Visit: Payer: Medicare Other

## 2022-12-05 DIAGNOSIS — M549 Dorsalgia, unspecified: Secondary | ICD-10-CM | POA: Diagnosis not present

## 2022-12-05 DIAGNOSIS — G894 Chronic pain syndrome: Secondary | ICD-10-CM | POA: Diagnosis not present

## 2022-12-05 DIAGNOSIS — Z79891 Long term (current) use of opiate analgesic: Secondary | ICD-10-CM | POA: Diagnosis not present

## 2023-01-02 DIAGNOSIS — M549 Dorsalgia, unspecified: Secondary | ICD-10-CM | POA: Diagnosis not present

## 2023-02-08 DIAGNOSIS — M549 Dorsalgia, unspecified: Secondary | ICD-10-CM | POA: Diagnosis not present

## 2023-02-08 DIAGNOSIS — M4850XA Collapsed vertebra, not elsewhere classified, site unspecified, initial encounter for fracture: Secondary | ICD-10-CM | POA: Diagnosis not present

## 2023-03-15 DIAGNOSIS — M4850XA Collapsed vertebra, not elsewhere classified, site unspecified, initial encounter for fracture: Secondary | ICD-10-CM | POA: Diagnosis not present

## 2023-03-15 DIAGNOSIS — M549 Dorsalgia, unspecified: Secondary | ICD-10-CM | POA: Diagnosis not present

## 2023-03-15 DIAGNOSIS — G894 Chronic pain syndrome: Secondary | ICD-10-CM | POA: Diagnosis not present

## 2023-03-15 DIAGNOSIS — Z79899 Other long term (current) drug therapy: Secondary | ICD-10-CM | POA: Diagnosis not present

## 2023-03-19 DIAGNOSIS — H6123 Impacted cerumen, bilateral: Secondary | ICD-10-CM | POA: Diagnosis not present

## 2023-04-24 DIAGNOSIS — Z1389 Encounter for screening for other disorder: Secondary | ICD-10-CM | POA: Diagnosis not present

## 2023-04-24 DIAGNOSIS — M4850XA Collapsed vertebra, not elsewhere classified, site unspecified, initial encounter for fracture: Secondary | ICD-10-CM | POA: Diagnosis not present

## 2023-04-24 DIAGNOSIS — M549 Dorsalgia, unspecified: Secondary | ICD-10-CM | POA: Diagnosis not present

## 2023-05-11 DIAGNOSIS — C642 Malignant neoplasm of left kidney, except renal pelvis: Secondary | ICD-10-CM | POA: Diagnosis not present

## 2023-05-18 DIAGNOSIS — C642 Malignant neoplasm of left kidney, except renal pelvis: Secondary | ICD-10-CM | POA: Diagnosis not present

## 2023-05-18 DIAGNOSIS — Z905 Acquired absence of kidney: Secondary | ICD-10-CM | POA: Diagnosis not present

## 2023-05-18 DIAGNOSIS — J439 Emphysema, unspecified: Secondary | ICD-10-CM | POA: Diagnosis not present

## 2023-05-18 DIAGNOSIS — D1803 Hemangioma of intra-abdominal structures: Secondary | ICD-10-CM | POA: Diagnosis not present

## 2023-05-23 DIAGNOSIS — M4850XA Collapsed vertebra, not elsewhere classified, site unspecified, initial encounter for fracture: Secondary | ICD-10-CM | POA: Diagnosis not present

## 2023-05-23 DIAGNOSIS — G894 Chronic pain syndrome: Secondary | ICD-10-CM | POA: Diagnosis not present

## 2023-06-05 DIAGNOSIS — C642 Malignant neoplasm of left kidney, except renal pelvis: Secondary | ICD-10-CM | POA: Diagnosis not present

## 2023-06-20 DIAGNOSIS — M549 Dorsalgia, unspecified: Secondary | ICD-10-CM | POA: Diagnosis not present

## 2023-06-20 DIAGNOSIS — Z79899 Other long term (current) drug therapy: Secondary | ICD-10-CM | POA: Diagnosis not present

## 2023-06-20 DIAGNOSIS — G894 Chronic pain syndrome: Secondary | ICD-10-CM | POA: Diagnosis not present

## 2023-07-18 DIAGNOSIS — Z1389 Encounter for screening for other disorder: Secondary | ICD-10-CM | POA: Diagnosis not present

## 2023-07-18 DIAGNOSIS — M549 Dorsalgia, unspecified: Secondary | ICD-10-CM | POA: Diagnosis not present

## 2023-07-18 DIAGNOSIS — M4850XA Collapsed vertebra, not elsewhere classified, site unspecified, initial encounter for fracture: Secondary | ICD-10-CM | POA: Diagnosis not present

## 2023-07-18 DIAGNOSIS — G894 Chronic pain syndrome: Secondary | ICD-10-CM | POA: Diagnosis not present

## 2023-07-20 DIAGNOSIS — I10 Essential (primary) hypertension: Secondary | ICD-10-CM | POA: Diagnosis not present

## 2023-07-20 DIAGNOSIS — E782 Mixed hyperlipidemia: Secondary | ICD-10-CM | POA: Diagnosis not present

## 2023-07-20 DIAGNOSIS — Z85528 Personal history of other malignant neoplasm of kidney: Secondary | ICD-10-CM | POA: Diagnosis not present

## 2023-07-20 DIAGNOSIS — D692 Other nonthrombocytopenic purpura: Secondary | ICD-10-CM | POA: Diagnosis not present

## 2023-07-20 DIAGNOSIS — J439 Emphysema, unspecified: Secondary | ICD-10-CM | POA: Diagnosis not present

## 2023-07-20 DIAGNOSIS — M109 Gout, unspecified: Secondary | ICD-10-CM | POA: Diagnosis not present

## 2023-07-20 DIAGNOSIS — J479 Bronchiectasis, uncomplicated: Secondary | ICD-10-CM | POA: Diagnosis not present

## 2023-07-20 DIAGNOSIS — J8410 Pulmonary fibrosis, unspecified: Secondary | ICD-10-CM | POA: Diagnosis not present

## 2023-07-20 DIAGNOSIS — N1832 Chronic kidney disease, stage 3b: Secondary | ICD-10-CM | POA: Diagnosis not present

## 2023-08-17 DIAGNOSIS — Z905 Acquired absence of kidney: Secondary | ICD-10-CM | POA: Diagnosis not present

## 2023-08-17 DIAGNOSIS — Z720 Tobacco use: Secondary | ICD-10-CM | POA: Diagnosis not present

## 2023-08-17 DIAGNOSIS — N1832 Chronic kidney disease, stage 3b: Secondary | ICD-10-CM | POA: Diagnosis not present

## 2023-08-17 DIAGNOSIS — I129 Hypertensive chronic kidney disease with stage 1 through stage 4 chronic kidney disease, or unspecified chronic kidney disease: Secondary | ICD-10-CM | POA: Diagnosis not present

## 2023-08-17 DIAGNOSIS — M109 Gout, unspecified: Secondary | ICD-10-CM | POA: Diagnosis not present

## 2023-08-17 DIAGNOSIS — E785 Hyperlipidemia, unspecified: Secondary | ICD-10-CM | POA: Diagnosis not present

## 2023-08-29 DIAGNOSIS — M549 Dorsalgia, unspecified: Secondary | ICD-10-CM | POA: Diagnosis not present

## 2023-08-29 DIAGNOSIS — Z79891 Long term (current) use of opiate analgesic: Secondary | ICD-10-CM | POA: Diagnosis not present

## 2023-08-29 DIAGNOSIS — G894 Chronic pain syndrome: Secondary | ICD-10-CM | POA: Diagnosis not present

## 2023-09-28 DIAGNOSIS — G894 Chronic pain syndrome: Secondary | ICD-10-CM | POA: Diagnosis not present

## 2023-09-28 DIAGNOSIS — Z79891 Long term (current) use of opiate analgesic: Secondary | ICD-10-CM | POA: Diagnosis not present

## 2023-09-28 DIAGNOSIS — M549 Dorsalgia, unspecified: Secondary | ICD-10-CM | POA: Diagnosis not present

## 2023-10-09 ENCOUNTER — Other Ambulatory Visit: Payer: Self-pay | Admitting: Urology

## 2023-10-09 DIAGNOSIS — C642 Malignant neoplasm of left kidney, except renal pelvis: Secondary | ICD-10-CM

## 2023-10-12 DIAGNOSIS — B351 Tinea unguium: Secondary | ICD-10-CM | POA: Diagnosis not present

## 2023-10-12 DIAGNOSIS — Z23 Encounter for immunization: Secondary | ICD-10-CM | POA: Diagnosis not present

## 2023-10-23 DIAGNOSIS — M79672 Pain in left foot: Secondary | ICD-10-CM | POA: Diagnosis not present

## 2023-10-23 DIAGNOSIS — I739 Peripheral vascular disease, unspecified: Secondary | ICD-10-CM | POA: Diagnosis not present

## 2023-10-23 DIAGNOSIS — M79671 Pain in right foot: Secondary | ICD-10-CM | POA: Diagnosis not present

## 2023-10-23 DIAGNOSIS — M79609 Pain in unspecified limb: Secondary | ICD-10-CM | POA: Diagnosis not present

## 2023-10-23 DIAGNOSIS — B351 Tinea unguium: Secondary | ICD-10-CM | POA: Diagnosis not present

## 2023-11-01 DIAGNOSIS — M5459 Other low back pain: Secondary | ICD-10-CM | POA: Diagnosis not present

## 2023-11-02 ENCOUNTER — Ambulatory Visit: Admitting: Podiatry

## 2023-11-02 DIAGNOSIS — M79674 Pain in right toe(s): Secondary | ICD-10-CM | POA: Diagnosis not present

## 2023-11-02 DIAGNOSIS — M79675 Pain in left toe(s): Secondary | ICD-10-CM

## 2023-11-02 DIAGNOSIS — L602 Onychogryphosis: Secondary | ICD-10-CM | POA: Diagnosis not present

## 2023-11-02 DIAGNOSIS — B351 Tinea unguium: Secondary | ICD-10-CM | POA: Diagnosis not present

## 2023-11-02 MED ORDER — CICLOPIROX 8 % EX SOLN: Freq: Every day | CUTANEOUS | 11 refills | Status: DC

## 2023-11-02 NOTE — Progress Notes (Signed)
 Subjective:  Patient ID: Bobby Nguyen, male    DOB: 09/12/1945,  MRN: [id number]  Bobby Nguyen presents to clinic today for:  Chief Complaint  Patient presents with   [facility]    RFC with possible callous. Worried about having to take off the hallux nails. They are really thick and long and discolored. Not diabetic and no anti coag.    Patient notes nails are thick, discolored, elongated and painful in shoegear when trying to ambulate.  He notes his great toenails of gotten so thick and are starting to turn and causing a lot of discomfort in his shoes.  He has tried to trim his own nails at home but states is too difficult and painful at this point.  He would like to start treating the fungal toenails.  PCP is Hamrick, Charlene CROME, MD.  Past Medical History:  Diagnosis Date   Back pain    Cancer (HCC)    skin cancer,right ear.   Chronic kidney disease    Gout    Hyperlipidemia    Hypertension    Stroke [facility])    Past Surgical History:  Procedure Laterality Date   arm surgery Left    fracture surgery.Plate and screws in.   CATARACT EXTRACTION W/ INTRAOCULAR LENS IMPLANT Bilateral    EP IMPLANTABLE DEVICE N/A 10/06/2015   Procedure: Loop Recorder Insertion;  Surgeon: Lynwood Rakers, MD;  Location: [facility] INVASIVE CV LAB;  Service: Cardiovascular;  Laterality: N/A;   ROBOT ASSISTED LAPAROSCOPIC NEPHRECTOMY Left 07/08/2022   Procedure: XI ROBOTIC ASSISTED LEFT LAPAROSCOPIC NEPHRECTOMY;  Surgeon: Devere Lonni Righter, MD;  Location: [facility] ORS;  Service: Urology;  Laterality: Left;   TEE WITHOUT CARDIOVERSION N/A 10/08/2015   Procedure: TRANSESOPHAGEAL ECHOCARDIOGRAM (TEE);  Surgeon: Vina LULLA Gull, MD;  Location: [facility] ENDOSCOPY;  Service: Cardiovascular;  Laterality: N/A;   Allergies  Allergen Reactions   Penicillins Itching, Swelling and Rash    Peripheral swelling    Review of Systems: Negative except as noted in the HPI.  Objective:  Bobby Nguyen is a pleasant 78 y.o. male in NAD.  AAO x 3.  Vascular Examination: Capillary refill time is 3-5 seconds to toes bilateral. Palpable pedal pulses b/l LE. Digital hair present b/l.  Skin temperature gradient WNL b/l. No varicosities b/l. No cyanosis noted b/l.   Dermatological Examination: Pedal skin with normal turgor, texture and tone b/l. No open wounds. No interdigital macerations b/l. Toenails x10 are 4-7 mm thick, discolored, dystrophic with subungual debris. There is pain with compression of the nail plates.  They are moderately elongated x10  Assessment/Plan: 1. Onychogryphosis   2. Pain due to onychomycosis of toenails of both feet     Meds ordered this encounter  Medications   ciclopirox  (PENLAC ) 8 % solution    Sig: Apply topically at bedtime. Apply thin layer over nail. Apply daily over previous coat. Remove weekly with polish remover.    Dispense:  6.6 mL    Refill:  11   The mycotic/gryphotic toenails were sharply debrided x10 with sterile nail nippers and a power debriding burr to decrease bulk/thickness and length.    Discussed and sent in prescription for ciclopirox  solution to be applied to toenails once a day.  I will recheck in 3 months on status of new, healthy nail growth.  Return in about 3 months (around 02/02/2024) for RFC.   Awanda CHARM Imperial, DPM, FACFAS [facility]     [address]  [place], [place] [address]                Office [phone]  Fax [phone]

## 2023-11-07 DIAGNOSIS — C642 Malignant neoplasm of left kidney, except renal pelvis: Secondary | ICD-10-CM | POA: Diagnosis not present

## 2023-11-23 DIAGNOSIS — J439 Emphysema, unspecified: Secondary | ICD-10-CM | POA: Diagnosis not present

## 2023-11-23 DIAGNOSIS — C642 Malignant neoplasm of left kidney, except renal pelvis: Secondary | ICD-10-CM | POA: Diagnosis not present

## 2023-11-23 DIAGNOSIS — D3501 Benign neoplasm of right adrenal gland: Secondary | ICD-10-CM | POA: Diagnosis not present

## 2023-11-23 DIAGNOSIS — R918 Other nonspecific abnormal finding of lung field: Secondary | ICD-10-CM | POA: Diagnosis not present

## 2023-12-05 ENCOUNTER — Ambulatory Visit
Admission: RE | Admit: 2023-12-05 | Discharge: 2023-12-05 | Disposition: A | Discharge status: Home / Self Care | Source: Ambulatory Visit | Attending: Urology | Admitting: Urology

## 2023-12-05 DIAGNOSIS — N281 Cyst of kidney, acquired: Secondary | ICD-10-CM | POA: Diagnosis not present

## 2023-12-05 DIAGNOSIS — C642 Malignant neoplasm of left kidney, except renal pelvis: Secondary | ICD-10-CM

## 2023-12-05 MED ORDER — GADOPICLENOL 0.5 MMOL/ML IV SOLN
7.0000 mL | Freq: Once | INTRAVENOUS | Status: AC | PRN
  Administered 2023-12-05: 7 mL via INTRAVENOUS

## 2023-12-07 DIAGNOSIS — R3915 Urgency of urination: Secondary | ICD-10-CM | POA: Diagnosis not present

## 2023-12-07 DIAGNOSIS — C642 Malignant neoplasm of left kidney, except renal pelvis: Secondary | ICD-10-CM | POA: Diagnosis not present

## 2023-12-07 DIAGNOSIS — D414 Neoplasm of uncertain behavior of bladder: Secondary | ICD-10-CM | POA: Diagnosis not present

## 2023-12-07 DIAGNOSIS — N183 Chronic kidney disease, stage 3 unspecified: Secondary | ICD-10-CM | POA: Diagnosis not present

## 2023-12-08 DIAGNOSIS — M5459 Other low back pain: Secondary | ICD-10-CM | POA: Diagnosis not present

## 2023-12-12 ENCOUNTER — Other Ambulatory Visit: Payer: Self-pay | Admitting: Urology

## 2023-12-21 NOTE — Progress Notes (Signed)
 COVID Vaccine received:  []  No [x]  Yes Date of any COVID positive Test in last 90 days:  PCP - Charlene Single, MD Rankin Dike, PA-C Cardiologist -  Pulmonology- Jennet Epley, MD  Oncology- Sherrod Sherrod, MD  Pain Mgmt- Norleen Ester PA-C, [place]   Chest x-ray - 10-05-2015  2v  Epic EKG -  [phone] repeated  Stress Test -  ECHO -  Cardiac Cath -  CT Coronary Calcium score:   Bowel Prep - []  No  []   Yes ______  Pacemaker / ICD device [x]  No []  Yes   Spinal Cord Stimulator:[x]  No []  Yes       History of Sleep Apnea? [x]  No []  Yes   CPAP used?- [x]  No []  Yes    Patient has: [x]  NO Hx DM   []  Pre-DM   []  DM1  []   DM2 Does the patient monitor blood sugar?   [x]  N/A   []  No []  Yes   Blood Thinner / Instructions: none Aspirin  Instructions:  none  Dental hx: []  Dentures:  []  N/A      []  Bridge or Partial:                   []  Loose or Damaged teeth:   Comments:   Activity level: Able to walk up 2 flights of stairs without becoming significantly short of breath or having chest pain?  []  No   []    Yes  Patient can perform ADLs without assistance. []  No   []   Yes  Anesthesia review: HTN, Hx ischemic CVA, has ILR but it is EOL and he refused to have battery replaced,  s/p left nephrectomy 07-08-2022 for cancer, CPS- Chronic opioids,  smoker  Patient denies any S&S of respiratory illness or Covid - no shortness of breath, fever, cough or chest pain at PAT appointment.  Patient verbalized understanding and agreement to the Pre-Surgical Instructions that were given to them at this PAT appointment. Patient was also educated of the need to review these PAT instructions again prior to his surgery.I reviewed the appropriate phone numbers to call if they have any and questions or concerns.

## 2023-12-21 NOTE — Patient Instructions (Signed)
 SURGICAL WAITING ROOM VISITATION Patients having surgery or a procedure may have no more than 2 support people in the waiting area - these visitors may rotate in the visitor waiting room.   If the patient needs to stay at the hospital during part of their recovery, the visitor guidelines for inpatient rooms apply.  PRE-OP VISITATION  Pre-op nurse will coordinate an appropriate time for 1 support person to accompany the patient in pre-op.  This support person may not rotate.  This visitor will be contacted when the time is appropriate for the visitor to come back in the pre-op area.  Please refer to the [facility] website for the visitor guidelines for Inpatients (after your surgery is over and you are in a regular room).  You are not required to quarantine at this time prior to your surgery. However, you must do this: Hand Hygiene often Do NOT share personal items Notify your provider if you are in close contact with someone who has COVID or you develop fever 100.4 or greater, new onset of sneezing, cough, sore throat, shortness of breath or body aches.  If you test positive for Covid or have been in contact with anyone that has tested positive in the last 10 days please notify you surgeon.    Your procedure is scheduled on:  Wednesday 12-27-2023  Report to [facility] Main Entrance: [address] entrance where the [employer] is available.   Report to admitting at: 08:15  AM  Call this number if you have any questions or problems the morning of surgery [phone]  DO NOT EAT OR DRINK ANYTHING AFTER MIDNIGHT THE NIGHT PRIOR TO YOUR SURGERY / PROCEDURE.   FOLLOW  ANY ADDITIONAL PRE OP INSTRUCTIONS YOU RECEIVED FROM YOUR SURGEON'S OFFICE!!!   Oral Hygiene is also important to reduce your risk of infection.        Remember - BRUSH YOUR TEETH THE MORNING OF SURGERY WITH YOUR REGULAR TOOTHPASTE  Do NOT smoke after Midnight the night before surgery.  STOP TAKING all Vitamins,  Herbs and supplements 1 week before your surgery.   Take ONLY these medicines the morning of surgery with A SIP OF WATER: Amlodipine, atenolol , fenofibrate , escitalopram  and you may take Oxycodone  APAP if needed.   You may not have any metal on your body including  jewelry, and body piercing  Do not wear  lotions, powders,  cologne, or deodorant  Men may shave face and neck.  Contacts, Hearing Aids, dentures or bridgework may not be worn into surgery. DENTURES WILL BE REMOVED PRIOR TO SURGERY PLEASE DO NOT APPLY Poly grip OR ADHESIVES!!!  You may bring a small overnight bag with you on the day of surgery, only pack items that are not valuable. [facility] IS NOT RESPONSIBLE   FOR VALUABLES THAT ARE LOST OR STOLEN.   Do not bring your home medications to the hospital. The Pharmacy will dispense medications listed on your medication list to you during your admission in the Hospital.  Please read over the following fact sheets you were given: IF YOU HAVE QUESTIONS ABOUT YOUR PRE-OP INSTRUCTIONS, PLEASE CALL [phone].   [facility] - Preparing for Surgery Before surgery, you can play an important role.  Because skin is not sterile, your skin needs to be as free of germs as possible.  You can reduce the number of germs on your skin by washing with CHG (chlorahexidine gluconate) soap before surgery.  CHG is an antiseptic cleaner which kills germs and bonds  with the skin to continue killing germs even after washing. Please DO NOT use if you have an allergy to CHG or antibacterial soaps.  If your skin becomes reddened/irritated stop using the CHG and inform your nurse when you arrive at Short Stay. Do not shave (including legs and underarms) for at least 48 hours prior to the first CHG shower.  You may shave your face/neck.  Please follow these instructions carefully:  1.  Shower with CHG Soap the night before surgery and the  morning of surgery.  2.  If you choose to wash your hair, wash  your hair first as usual with your normal  shampoo.  3.  After you shampoo, rinse your hair and body thoroughly to remove the shampoo.                             4.  Use CHG as you would any other liquid soap.  You can apply chg directly to the skin and wash.  Gently with a scrungie or clean washcloth.  5.  Apply the CHG Soap to your body ONLY FROM THE NECK DOWN.   Do not use on face/ open                           Wound or open sores. Avoid contact with eyes, ears mouth and genitals (private parts).                       Wash face,  Genitals (private parts) with your normal soap.             6.  Wash thoroughly, paying special attention to the area where your  surgery  will be performed.  7.  Thoroughly rinse your body with warm water from the neck down.  8.  DO NOT shower/wash with your normal soap after using and rinsing off the CHG Soap.            9.  Pat yourself dry with a clean towel.            10.  Wear clean pajamas.            11.  Place clean sheets on your bed the night of your first shower and do not  sleep with pets.  ON THE DAY OF SURGERY : Do not apply any lotions/deodorants the morning of surgery.  Please wear clean clothes to the hospital/surgery center.    FAILURE TO FOLLOW THESE INSTRUCTIONS MAY RESULT IN THE CANCELLATION OF YOUR SURGERY  PATIENT SIGNATURE_________________________________  NURSE SIGNATURE__________________________________  ________________________________________________________________________

## 2023-12-22 ENCOUNTER — Encounter (HOSPITAL_COMMUNITY)
Admission: RE | Admit: 2023-12-22 | Discharge: 2023-12-22 | Disposition: A | Discharge status: Home / Self Care | Source: Ambulatory Visit | Attending: Urology | Admitting: Urology

## 2023-12-22 ENCOUNTER — Encounter (HOSPITAL_COMMUNITY): Payer: Self-pay

## 2023-12-22 ENCOUNTER — Other Ambulatory Visit: Payer: Self-pay

## 2023-12-22 VITALS — BP 142/48 | HR 58 | Temp 97.9°F | Resp 14 | Ht 66.0 in | Wt 158.0 lb

## 2023-12-22 DIAGNOSIS — F32A Depression, unspecified: Secondary | ICD-10-CM | POA: Insufficient documentation

## 2023-12-22 DIAGNOSIS — D414 Neoplasm of uncertain behavior of bladder: Secondary | ICD-10-CM | POA: Insufficient documentation

## 2023-12-22 DIAGNOSIS — N401 Enlarged prostate with lower urinary tract symptoms: Secondary | ICD-10-CM | POA: Insufficient documentation

## 2023-12-22 DIAGNOSIS — I10 Essential (primary) hypertension: Secondary | ICD-10-CM

## 2023-12-22 DIAGNOSIS — I700 Atherosclerosis of aorta: Secondary | ICD-10-CM | POA: Diagnosis not present

## 2023-12-22 DIAGNOSIS — N189 Chronic kidney disease, unspecified: Secondary | ICD-10-CM | POA: Diagnosis not present

## 2023-12-22 DIAGNOSIS — Z01818 Encounter for other preprocedural examination: Secondary | ICD-10-CM | POA: Insufficient documentation

## 2023-12-22 DIAGNOSIS — G894 Chronic pain syndrome: Secondary | ICD-10-CM | POA: Insufficient documentation

## 2023-12-22 DIAGNOSIS — F17210 Nicotine dependence, cigarettes, uncomplicated: Secondary | ICD-10-CM | POA: Insufficient documentation

## 2023-12-22 DIAGNOSIS — F1120 Opioid dependence, uncomplicated: Secondary | ICD-10-CM | POA: Diagnosis not present

## 2023-12-22 DIAGNOSIS — J449 Chronic obstructive pulmonary disease, unspecified: Secondary | ICD-10-CM | POA: Diagnosis not present

## 2023-12-22 DIAGNOSIS — I1310 Hypertensive heart and chronic kidney disease without heart failure, with stage 1 through stage 4 chronic kidney disease, or unspecified chronic kidney disease: Secondary | ICD-10-CM | POA: Diagnosis not present

## 2023-12-22 DIAGNOSIS — F1190 Opioid use, unspecified, uncomplicated: Secondary | ICD-10-CM

## 2023-12-22 DIAGNOSIS — Z8673 Personal history of transient ischemic attack (TIA), and cerebral infarction without residual deficits: Secondary | ICD-10-CM | POA: Diagnosis not present

## 2023-12-22 HISTORY — DX: Unspecified osteoarthritis, unspecified site: M19.90

## 2023-12-22 HISTORY — DX: Atherosclerosis of aorta: I70.0

## 2023-12-22 HISTORY — DX: Depression, unspecified: F32.A

## 2023-12-22 HISTORY — DX: Chronic obstructive pulmonary disease, unspecified: J44.9

## 2023-12-22 HISTORY — DX: Pneumonia, unspecified organism: J18.9

## 2023-12-22 LAB — CBC
HCT: 42.8 % (ref 39.0–52.0)
Hemoglobin: 13.4 g/dL (ref 13.0–17.0)
MCH: 30.2 pg (ref 26.0–34.0)
MCHC: 31.3 g/dL (ref 30.0–36.0)
MCV: 96.6 fL (ref 80.0–100.0)
Platelets: 326 K/uL (ref 150–400)
RBC: 4.43 MIL/uL (ref 4.22–5.81)
RDW: 14.5 % (ref 11.5–15.5)
WBC: 11.9 K/uL — ABNORMAL HIGH (ref 4.0–10.5)
nRBC: 0 % (ref 0.0–0.2)

## 2023-12-22 LAB — COMPREHENSIVE METABOLIC PANEL WITH GFR
ALT: <5 U/L (ref 0–44)
AST: 16 U/L (ref 15–41)
Albumin: 4.2 g/dL (ref 3.5–5.0)
Alkaline Phosphatase: 58 U/L (ref 38–126)
Anion gap: 12 (ref 5–15)
BUN: 25 mg/dL — ABNORMAL HIGH (ref 8–23)
CO2: 23 mmol/L (ref 22–32)
Calcium: 9.9 mg/dL (ref 8.9–10.3)
Chloride: 102 mmol/L (ref 98–111)
Creatinine, Ser: 1.55 mg/dL — ABNORMAL HIGH (ref 0.61–1.24)
GFR, Estimated: 46 mL/min — ABNORMAL LOW (ref >60)
Glucose, Bld: 93 mg/dL (ref 70–99)
Potassium: 4.3 mmol/L (ref 3.5–5.1)
Sodium: 137 mmol/L (ref 135–145)
Total Bilirubin: 0.4 mg/dL (ref 0.0–1.2)
Total Protein: 7.7 g/dL (ref 6.5–8.1)

## 2023-12-25 ENCOUNTER — Encounter (HOSPITAL_COMMUNITY): Payer: Self-pay

## 2023-12-25 NOTE — Progress Notes (Signed)
 Case: [id number] Date/Time: 12/27/23 1015   Procedures:      TURP (TRANSURETHRAL RESECTION OF PROSTATE) - TURP (TRANSURETHRAL RESECTION OF PROSTATE), TURBT (TRANSURETHRAL RESECTION OF BLADDER TUMOR), POSSIBLECYSTOSCOPY WITH RIGHT STENT INSERTION     TURBT (TRANSURETHRAL RESECTION OF BLADDER TUMOR)     CYSTOSCOPY, WITH STENT INSERTION (Right)   Anesthesia type: General   Diagnosis:      Neoplasm of uncertain behavior of bladder [D41.4]     Hyperplasia of prostate with lower urinary tract symptoms (LUTS) [N40.1]   Pre-op diagnosis: BLADDER TUMOR, BENIGN PROSTATIC HYPERPLASIA WITH LOWER URINARY TRACT SYMPTOMS   Location: [facility] PROCEDURE ROOM / [facility] ORS   Surgeons: Devere Lonni Righter, MD        DISCUSSION: Bobby Nguyen is a 78 yo male with PMH of current smoking, HTN, aortic atherosclerosis, COPD, bronchiectasis, hx of cryptogenic CVA (2017), CKD, RCC s/p left nephrectomy (06/2022), depression, arthritis, chronic pain syndrome with narcotic dependence.  Hx of RCC with left nephrectomy in 06/2022. Pt seen by Oncology and declined chemo s/p surgery. MRI abdomen obtained for restaging showing possible bladder mass. Now scheduled for surgery above.  Hx of CKD which is stable on pre op labs  Last seen by PCP in 06/2023, unable to view outside records. At PAT visit pt reports he can do a flight of stairs without CP/SOB.   VS: BP (!) 142/48 Comment: right arm sitting  Pulse (!) 58   Temp 36.6 C (Oral)   Resp 14   Ht 5' 6 (1.676 m)   Wt 71.7 kg   SpO2 98%   BMI 25.50 kg/m   PROVIDERS: Hamrick, Charlene CROME, MD  LABS: Labs reviewed: Acceptable for surgery. (all labs ordered are listed, but only abnormal results are displayed)  Labs Reviewed  CBC - Abnormal; Notable for the following components:      Result Value   WBC 11.9 (*)    All other components within normal limits  COMPREHENSIVE METABOLIC PANEL WITH GFR - Abnormal; Notable for the following components:   BUN 25 (*)     Creatinine, Ser 1.55 (*)    GFR, Estimated 46 (*)    All other components within normal limits   MRI Abdomen 12/05/23:  IMPRESSION: 1. Left nephrectomy without findings of local recurrence or metastatic disease. 2. Suspicion for a potential 1.4 cm tumor along the luminal margin of the right urinary bladder. It is possible that this is artifact but dedicated imaging such as CT pelvis with and without contrast or hematuria protocol CT of the abdomen and pelvis with and without contrast is recommended to exclude urothelial tumor. 3. Indistinct and chronic right lower lobe and infrahilar consolidation. Malignancy not excluded. 4. Substantial atheromatous vascular disease including the origins of the celiac trunk and SMA. Aortic Atherosclerosis (ICD10-I70.0). 5. Lumbar spondylosis and degenerative disc disease.  EKG 12/22/23  Sinus bradycardia Left axis deviation Minimal voltage criteria for LVH, may be normal variant ( R in aVL ) Septal infarct , age undetermined  Echo 10/05/2015:  Study Conclusions  - Left ventricle: The cavity size was normal. Wall thickness was   increased in a pattern of mild LVH. Systolic function was normal.   The estimated ejection fraction was in the range of 55% to 60%.   Wall motion was normal; there were no regional wall motion   abnormalities. There was no evidence of elevated ventricular   filling pressure by Doppler parameters.  Impressions:  - No cardiac source of emboli was indentified.  Past  Medical History:  Diagnosis Date   Arthritis    Back pain    Cancer (HCC)    skin cancer,right ear.   Chronic kidney disease    Depression    Gout    Hyperlipidemia    Hypertension    Pneumonia    Stroke [facility])    Ischemic MCA CVA    Past Surgical History:  Procedure Laterality Date   arm surgery Left    fracture surgery.Plate and screws in.   BACK SURGERY  2010   Thoracic fusion, kyphoplasty   CATARACT EXTRACTION W/ INTRAOCULAR LENS IMPLANT  Bilateral    EP IMPLANTABLE DEVICE N/A 10/06/2015   Procedure: Loop Recorder Insertion;  Surgeon: Lynwood Rakers, MD;  Location: [facility] INVASIVE CV LAB;  Service: Cardiovascular;  Laterality: N/A;   FRACTURE SURGERY Left    ORIF wrist,   ROBOT ASSISTED LAPAROSCOPIC NEPHRECTOMY Left 07/08/2022   Procedure: XI ROBOTIC ASSISTED LEFT LAPAROSCOPIC NEPHRECTOMY;  Surgeon: Devere Lonni Righter, MD;  Location: [facility] ORS;  Service: Urology;  Laterality: Left;   TEE WITHOUT CARDIOVERSION N/A 10/08/2015   Procedure: TRANSESOPHAGEAL ECHOCARDIOGRAM (TEE);  Surgeon: Vina LULLA Gull, MD;  Location: [facility] ENDOSCOPY;  Service: Cardiovascular;  Laterality: N/A;    MEDICATIONS:  ciclopirox  (PENLAC ) 8 % solution   allopurinol  (ZYLOPRIM ) 300 MG tablet   amLODipine (NORVASC) 10 MG tablet   atenolol  (TENORMIN ) 50 MG tablet   bisacodyl (DULCOLAX) 5 MG EC tablet   escitalopram  (LEXAPRO ) 10 MG tablet   fenofibrate  160 MG tablet   oxyCODONE -acetaminophen  (PERCOCET) 10-325 MG tablet   No current facility-administered medications for this encounter.   Burnard CHRISTELLA Odis DEVONNA [facility] Surgical Short Stay/Anesthesiology [facility] Phone [phone] 12/25/2023 10:52 AM

## 2023-12-25 NOTE — Anesthesia Preprocedure Evaluation (Signed)
 Anesthesia Evaluation  Patient identified by MRN, date of birth, ID band Patient awake    Reviewed: Allergy & Precautions, NPO status , Patient's Chart, lab work & pertinent test results, reviewed documented beta blocker date and time   History of Anesthesia Complications Negative for: history of anesthetic complications  Airway Mallampati: II       Dental  (+) Missing, Chipped, Poor Dentition, Dental Advisory Given   Pulmonary Current SmokerPatient did not abstain from smoking.    + wheezing      Cardiovascular hypertension,  Rhythm:Regular Rate:Normal     Neuro/Psych TIA   GI/Hepatic   Endo/Other    Renal/GU Renal InsufficiencyRenal disease (Baseline Cr of ~1.5)S/p L Nephrectomy     Musculoskeletal   Abdominal   Peds  Hematology   Anesthesia Other Findings   Reproductive/Obstetrics                              Anesthesia Physical Anesthesia Plan  ASA: 3  Anesthesia Plan: General   Post-op Pain Management:    Induction: Intravenous  PONV Risk Score and Plan: 1  Airway Management Planned: Oral ETT  Additional Equipment:   Intra-op Plan:   Post-operative Plan: Extubation in OR  Informed Consent:      Dental advisory given  Plan Discussed with: CRNA and Surgeon  Anesthesia Plan Comments: (See PAT note from 9/26 )         Anesthesia Quick Evaluation

## 2023-12-26 NOTE — H&P (Signed)
 Office Visit Report     12/07/2023   --------------------------------------------------------------------------------   Bobby Nguyen  MRN: [id number]  DOB: 1945-06-03, 78 year old Male  SSN: **[id number]   PRIMARY CARE:  Maura L. Hamrick, MD  PRIMARY CARE FAX:  [phone]  REFERRING:  Lonni A. Devere, MD  PROVIDER:  Lonni Devere, M.D.  LOCATION:  [facility] [phone]     --------------------------------------------------------------------------------   CC/HPI: Left RCC   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   Pathology: Clear-cell renal cell carcinoma, nuclear grade 2, size 5.3 and 0.4 cm. Tumor extends into renal vein (pT3a). Ureteral, vascular and all margins of resection are negative for tumor   07/22/2022: The patient is here today for routine postop visit. He has done well postoperatively and denies any residual pain. He is tolerating a regular diet and having daily bowel movements. He is voiding without difficulty denies any dysuria or hematuria. He is also ambulating without difficulty denies any chest pain or shortness of breath.   08/16/22: He presents today for postoperative clearance. He is doing well. No complaints today. He is eating a normal diet and is ambulating without difficulty.   11/21/22: The patient is here today for a routine follow-up. Surveillance CT chest/abdomen shows no evidence of RCC recurrence. He was noted to have extensive bilateral emphysema. Renal function remains stable. He is doing well and has no complaints. He states that he is voiding w/o difficulty and denies interval UTIs, dysuria or hematuria. He declined aduvant immunotherapy citing concern about side effects.   06/05/2023: The patient is here today for routine follow-up. His recent surveillance CT scan showed no evidence of RCC recurrence.  Noted to have emphysematous changes to his lung fields. His renal function has declined over the past 6 months. From a urinary standpoint, he reports a good FOS and feels like he is emptying his bladder well. He has occasional urgency/frequency, but is not bothered by it. Nocturia x 1. Denies interval UTIs, dysuria or hematuria.   12/07/23: The patient is here today for a routine follow-up. His surveillance MRI shows no evidence of RCC recurrence. He was found to have a solid appearing bladder lesion concerning for UCC. He denies interval episodes of hematuria, but does note increased urinary urgency and frequency.     ALLERGIES: Penicillin    MEDICATIONS: Allopurinol  300 MG Tablet  amLODIPine Besylate 10 MG Tablet  Atenolol   Escitalopram  Oxalate 10 MG Tablet  Fenofibrate  160 MG Tablet  Lubiprostone 24 MCG Capsule  oxyCODONE  HCl  Prevagen  Stool Stofener     GU PSH: Radical nephrectomy (laparoscopic), Left - 07/08/2022     NON-GU PSH: Back Surgery (Unspecified) Hand/finger Surgery Visit Complexity (formerly GPC1X) - 06/05/2023, 11/21/2022     GU PMH: Renal cell carcinoma, left - 11/23/2023, - 06/05/2023, - 05/18/2023, - 11/21/2022, - 08/16/2022, - 07/22/2022 Chronic kidney disease stage 3 (GFR 30-60) - 06/05/2023 Left renal neoplasm - 11/17/2022, - 06/20/2022    NON-GU PMH: Other nonspecific abnormal finding of lung field - 06/05/2023, - 11/17/2022, - 07/22/2022, - 06/20/2022 Gout Hypertension Stroke/TIA    FAMILY HISTORY: 1 Daughter - Daughter 1 son - Son   SOCIAL HISTORY: Marital Status: Married Preferred Language: English; Ethnicity: Not Hispanic Or Latino; Race: White Current Smoking Status: Patient smokes.   Tobacco Use Assessment Completed: Used Tobacco in last 30 days? Types of alcohol consumed:  Beer.  Drinks 1 caffeinated drink per day.    REVIEW OF SYSTEMS:    GU Review Male:   Patient denies frequent urination, hard to postpone urination, burning/ pain with urination,  get up at night to urinate, leakage of urine, stream starts and stops, trouble starting your stream, have to strain to urinate , erection problems, and penile pain.  Gastrointestinal (Upper):   Patient denies nausea, vomiting, and indigestion/ heartburn.  Gastrointestinal (Lower):   Patient denies diarrhea and constipation.  Constitutional:   Patient denies fever, night sweats, weight loss, and fatigue.  Skin:   Patient denies skin rash/ lesion and itching.  Eyes:   Patient denies blurred vision and double vision.  Ears/ Nose/ Throat:   Patient denies sore throat and sinus problems.  Hematologic/Lymphatic:   Patient denies swollen glands and easy bruising.  Cardiovascular:   Patient denies leg swelling and chest pains.  Respiratory:   Patient denies cough and shortness of breath.  Endocrine:   Patient denies excessive thirst.  Musculoskeletal:   Patient denies back pain and joint pain.  Neurological:   Patient denies headaches and dizziness.  Psychologic:   Patient denies depression and anxiety.   VITAL SIGNS: None   GU PHYSICAL EXAMINATION:    Urethral Meatus: Normal size. No lesion, no wart, no discharge, no polyp. Normal location.  Penis: Penis uncircumcised, cracks and fissures. No foreskin warts. No dorsal peyronie's plaques, no left corporal peyronie's plaques, no right corporal peyronie's plaques, no scarring, no shaft warts. No balanitis, no meatal stenosis.    MULTI-SYSTEM PHYSICAL EXAMINATION:    Constitutional: Well-nourished. No physical deformities. Normally developed. Good grooming.  Respiratory: No labored breathing, no use of accessory muscles.   Neurologic / Psychiatric: Oriented to time, oriented to place, oriented to person. No depression, no anxiety, no agitation.     Complexity of Data:  Records Review:   Previous Patient Records  X-Ray Review: MRI Abdomen: Reviewed Films. Reviewed Report. Discussed With Patient. CLINICAL DATA: Left renal cell carcinoma, left  nephrectomy on  07/08/2022, restaging assessment   EXAM:  MRI ABDOMEN WITHOUT AND WITH CONTRAST   TECHNIQUE:  Multiplanar multisequence MR imaging of the abdomen was performed  both before and after the administration of intravenous contrast.   CONTRAST: 7 cc Vueway    COMPARISON: CT chest 06/30/2022 and CT abdomen 05/18/2023   FINDINGS:  Lower chest: Indistinct and chronic right lower lobe and infrahilar  consolidation. Malignancy not excluded.   Hepatobiliary: 1.8 by 1.1 cm hemangioma posteriorly in the right  hepatic lobe on image 11 series 6 with associated transient hepatic  attenuation difference, and both early and delayed enhancement. No  further imaging workup of this lesion is indicated.   0.9 cm cyst inferiorly in the right hepatic lobe on image 24 series  6, no associated enhancement. No further imaging workup of this  lesion is indicated.   3-4 mm hemangioma posteriorly in the right hepatic lobe on image 31  series 19. No further imaging workup of this lesion is indicated.   Pancreas: Unremarkable   Spleen: Unremarkable   Adrenals/Urinary Tract: Adrenal glands unremarkable.   Bosniak category 2 cyst of the right kidney upper pole measuring 0.9  cm in diameter on image 36 series 11 with high T1 low T2 signal  characteristics but no observed internal enhancement.   Left nephrectomy without findings of local recurrence.   On images 15-16 of series 3 there is suspicion for a potential 1.4  cm tumor along the  luminal margin of the right urinary bladder. It  is possible that this is artifact but dedicated imaging such as CT  pelvis with and without contrast is recommended to exclude  urothelial tumor.   Stomach/Bowel: Unremarkable   Vascular/Lymphatic: Substantial atheromatous vascular disease  including the origins of the celiac trunk and SMA. No pathologic  adenopathy.   Other: No supplemental non-categorized findings.   Musculoskeletal: Lumbar spondylosis  and degenerative disc disease.   IMPRESSION:  1. Left nephrectomy without findings of local recurrence or  metastatic disease.  2. Suspicion for a potential 1.4 cm tumor along the luminal margin  of the right urinary bladder. It is possible that this is artifact  but dedicated imaging such as CT pelvis with and without contrast or  hematuria protocol CT of the abdomen and pelvis with and without  contrast is recommended to exclude urothelial tumor.  3. Indistinct and chronic right lower lobe and infrahilar  consolidation. Malignancy not excluded.  4. Substantial atheromatous vascular disease including the origins  of the celiac trunk and SMA. Aortic Atherosclerosis (ICD10-I70.0).  5. Lumbar spondylosis and degenerative disc disease.    Electronically Signed  By: Ryan Salvage M.D.  On: 12/05/2023 18:34      PROCEDURES:         Flexible Cystoscopy - 52000  Risks, benefits, and some of the potential complications of the procedure were discussed at length with the patient including infection, bleeding, voiding discomfort, urinary retention, fever, chills, sepsis, and others. All questions were answered. Informed consent was obtained. Antibiotic prophylaxis was given. Sterile technique and intraurethral analgesia were used.  Meatus:  Normal size. Normal location. Normal condition.  Urethra:  No strictures.  External Sphincter:  Normal.  Verumontanum:  Normal.  Prostate:  Obstructing. Enlarged median lobe. No hyperplasia.  Bladder Neck:  Non-obstructing.  Ureteral Orifices:  Normal location. Normal size. Normal shape. Effluxed clear urine.  Bladder:  A right lateral wall tumor. 2 cm tumor. No trabeculation. Normal mucosa. No stones.      The lower urinary tract was carefully examined. The procedure was well-tolerated and without complications. Antibiotic instructions were given. Instructions were given to call the office immediately for bloody urine, difficulty urinating, urinary  retention, painful or frequent urination, fever, chills, nausea, vomiting or other illness. The patient stated that he understood these instructions and would comply with them.         Visit Complexity - G2211          Urinalysis w/Scope Dipstick Dipstick Cont'd Micro  Color: Yellow Bilirubin: Neg mg/dL WBC/hpf: 0 - 5/hpf  Appearance: Clear Ketones: Neg mg/dL RBC/hpf: 0 - 2/hpf  Specific Gravity: 1.020 Blood: Neg ery/uL Bacteria: Rare (0-9/hpf)  pH: 5.5 Protein: 3+ mg/dL Cystals: NS (Not Seen)  Glucose: Neg mg/dL Urobilinogen: 0.2 mg/dL Casts: NS (Not Seen)    Nitrites: Neg Trichomonas: Not Present    Leukocyte Esterase: Neg leu/uL Mucous: Not Present      Epithelial Cells: 0 - 5/hpf      Yeast: NS (Not Seen)      Sperm: Not Present    ASSESSMENT:      ICD-10 Details  1 GU:   Bladder tumor/neoplasm - D41.4   2   Renal cell carcinoma, left - C64.2   3   Chronic kidney disease stage 3 (GFR 30-60) - N18.3   4   BPH w/LUTS - N40.1   5   Urinary Urgency - R39.15    PLAN:  Medications Stop Meds: Morphine  Sulfate 15 MG Tablet  Discontinue: 12/07/2023  - Reason: stopped           Schedule Return Visit/Planned Activity: ASAP - Schedule Surgery          Document Letter(s):  Created for Patient: Clinical Summary   Created for Maura L. Hamrick, MD         Notes:    - Cystoscopy today revealed a 2 cm papillary bladder tumor involving the right sidewall/bladder base with features concerning for urothelial cell carcinoma. He was noted to have trilobar prostatic urethral obstruction as well.   The risks, benefits and alternatives of cystoscopy with TURBT, TURP and possible right ureteral stent placement was discussed with the patient. The risks include, but are not limited to, bleeding, urinary tract infection, bladder perforation requiring prolonged catheterization and/or open bladder repair, ureteral obstruction, voiding dysfunction and the inherent risks of general  anesthesia. The patient voices understanding and wishes to proceed.

## 2023-12-27 ENCOUNTER — Observation Stay (HOSPITAL_COMMUNITY)
Admission: RE | Admit: 2023-12-27 | Discharge: 2023-12-28 | Disposition: A | Discharge status: Home / Self Care | Source: Ambulatory Visit | Attending: Urology | Admitting: Urology

## 2023-12-27 ENCOUNTER — Other Ambulatory Visit: Payer: Self-pay

## 2023-12-27 ENCOUNTER — Encounter (HOSPITAL_COMMUNITY)
Admission: RE | Disposition: A | Discharge status: Home / Self Care | Payer: Self-pay | Source: Ambulatory Visit | Attending: Urology

## 2023-12-27 ENCOUNTER — Encounter (HOSPITAL_COMMUNITY): Payer: Self-pay | Admitting: Urology

## 2023-12-27 ENCOUNTER — Ambulatory Visit (HOSPITAL_BASED_OUTPATIENT_CLINIC_OR_DEPARTMENT_OTHER)

## 2023-12-27 ENCOUNTER — Ambulatory Visit (HOSPITAL_COMMUNITY)

## 2023-12-27 ENCOUNTER — Ambulatory Visit (HOSPITAL_COMMUNITY): Payer: Self-pay | Admitting: Physician Assistant

## 2023-12-27 DIAGNOSIS — C679 Malignant neoplasm of bladder, unspecified: Secondary | ICD-10-CM

## 2023-12-27 DIAGNOSIS — Z79899 Other long term (current) drug therapy: Secondary | ICD-10-CM | POA: Diagnosis not present

## 2023-12-27 DIAGNOSIS — D494 Neoplasm of unspecified behavior of bladder: Secondary | ICD-10-CM

## 2023-12-27 DIAGNOSIS — N400 Enlarged prostate without lower urinary tract symptoms: Secondary | ICD-10-CM

## 2023-12-27 DIAGNOSIS — F17200 Nicotine dependence, unspecified, uncomplicated: Secondary | ICD-10-CM | POA: Diagnosis not present

## 2023-12-27 DIAGNOSIS — R3915 Urgency of urination: Secondary | ICD-10-CM | POA: Diagnosis not present

## 2023-12-27 DIAGNOSIS — N401 Enlarged prostate with lower urinary tract symptoms: Secondary | ICD-10-CM | POA: Diagnosis not present

## 2023-12-27 DIAGNOSIS — N183 Chronic kidney disease, stage 3 unspecified: Secondary | ICD-10-CM | POA: Insufficient documentation

## 2023-12-27 DIAGNOSIS — F17210 Nicotine dependence, cigarettes, uncomplicated: Secondary | ICD-10-CM | POA: Diagnosis not present

## 2023-12-27 DIAGNOSIS — R350 Frequency of micturition: Secondary | ICD-10-CM | POA: Diagnosis not present

## 2023-12-27 DIAGNOSIS — Z85528 Personal history of other malignant neoplasm of kidney: Secondary | ICD-10-CM | POA: Insufficient documentation

## 2023-12-27 DIAGNOSIS — C672 Malignant neoplasm of lateral wall of bladder: Principal | ICD-10-CM | POA: Insufficient documentation

## 2023-12-27 DIAGNOSIS — I129 Hypertensive chronic kidney disease with stage 1 through stage 4 chronic kidney disease, or unspecified chronic kidney disease: Secondary | ICD-10-CM | POA: Diagnosis not present

## 2023-12-27 DIAGNOSIS — I10 Essential (primary) hypertension: Secondary | ICD-10-CM | POA: Diagnosis not present

## 2023-12-27 HISTORY — PX: CYSTOSCOPY WITH STENT PLACEMENT: SHX5790

## 2023-12-27 HISTORY — PX: TRANSURETHRAL RESECTION OF PROSTATE: SHX73

## 2023-12-27 HISTORY — DX: Benign prostatic hyperplasia without lower urinary tract symptoms: N40.0

## 2023-12-27 HISTORY — PX: TRANSURETHRAL RESECTION OF BLADDER TUMOR: SHX2575

## 2023-12-27 HISTORY — DX: Malignant neoplasm of bladder, unspecified: C67.9

## 2023-12-27 SURGERY — TURP (TRANSURETHRAL RESECTION OF PROSTATE)
Anesthesia: General | Laterality: Right

## 2023-12-27 MED ORDER — ONDANSETRON HCL 4 MG/2ML IJ SOLN: 4.0000 mg | Freq: Once | INTRAMUSCULAR | Status: DC | PRN

## 2023-12-27 MED ORDER — TRANEXAMIC ACID-NACL 1000-0.7 MG/100ML-% IV SOLN
INTRAVENOUS | Status: AC
  Filled 2023-12-27: qty 100

## 2023-12-27 MED ORDER — OXYCODONE HCL 5 MG PO TABS: 5.0000 mg | ORAL_TABLET | Freq: Once | ORAL | Status: DC | PRN

## 2023-12-27 MED ORDER — FENTANYL CITRATE (PF) 100 MCG/2ML IJ SOLN
INTRAMUSCULAR | Status: DC | PRN
  Administered 2023-12-27 (×2): 25 ug via INTRAVENOUS
  Administered 2023-12-27: 50 ug via INTRAVENOUS

## 2023-12-27 MED ORDER — ACETAMINOPHEN 10 MG/ML IV SOLN: 1000.0000 mg | Freq: Once | INTRAVENOUS | Status: DC | PRN

## 2023-12-27 MED ORDER — ORAL CARE MOUTH RINSE: 15.0000 mL | Freq: Once | OROMUCOSAL | Status: AC

## 2023-12-27 MED ORDER — CIPROFLOXACIN IN D5W 400 MG/200ML IV SOLN
400.0000 mg | INTRAVENOUS | Status: AC
  Administered 2023-12-27: 400 mg via INTRAVENOUS
  Filled 2023-12-27: qty 200

## 2023-12-27 MED ORDER — CEFAZOLIN SODIUM-DEXTROSE 2-4 GM/100ML-% IV SOLN
2.0000 g | Freq: Three times a day (TID) | INTRAVENOUS | Status: DC
  Administered 2023-12-27 – 2023-12-28 (×2): 2 g via INTRAVENOUS
  Filled 2023-12-27 (×2): qty 100

## 2023-12-27 MED ORDER — SENNOSIDES-DOCUSATE SODIUM 8.6-50 MG PO TABS: 1.0000 | ORAL_TABLET | Freq: Every evening | ORAL | Status: DC | PRN

## 2023-12-27 MED ORDER — HYDROMORPHONE HCL 1 MG/ML IJ SOLN
INTRAMUSCULAR | Status: AC
  Filled 2023-12-27: qty 1

## 2023-12-27 MED ORDER — ONDANSETRON HCL 4 MG/2ML IJ SOLN
INTRAMUSCULAR | Status: AC
  Filled 2023-12-27: qty 2

## 2023-12-27 MED ORDER — DIPHENHYDRAMINE HCL 12.5 MG/5ML PO ELIX: 12.5000 mg | ORAL_SOLUTION | Freq: Four times a day (QID) | ORAL | Status: DC | PRN

## 2023-12-27 MED ORDER — SODIUM CHLORIDE 0.9 % IR SOLN
3000.0000 mL | Status: DC
  Administered 2023-12-27: 3000 mL

## 2023-12-27 MED ORDER — FENTANYL CITRATE (PF) 100 MCG/2ML IJ SOLN
INTRAMUSCULAR | Status: AC
  Filled 2023-12-27: qty 2

## 2023-12-27 MED ORDER — BISACODYL 5 MG PO TBEC: 10.0000 mg | DELAYED_RELEASE_TABLET | Freq: Every day | ORAL | Status: DC

## 2023-12-27 MED ORDER — CEFAZOLIN SODIUM 1 G IJ SOLR
INTRAMUSCULAR | Status: AC
  Filled 2023-12-27: qty 20

## 2023-12-27 MED ORDER — FENTANYL CITRATE PF 50 MCG/ML IJ SOSY: 25.0000 ug | PREFILLED_SYRINGE | INTRAMUSCULAR | Status: DC | PRN

## 2023-12-27 MED ORDER — ENSURE PLUS HIGH PROTEIN PO LIQD: 237.0000 mL | Freq: Two times a day (BID) | ORAL | Status: DC

## 2023-12-27 MED ORDER — SODIUM CHLORIDE 0.9 % IR SOLN
Status: DC | PRN
  Administered 2023-12-27: 18000 mL via INTRAVESICAL

## 2023-12-27 MED ORDER — PROPOFOL 10 MG/ML IV BOLUS
INTRAVENOUS | Status: DC | PRN
  Administered 2023-12-27: 60 mg via INTRAVENOUS
  Administered 2023-12-27: 20 mg via INTRAVENOUS

## 2023-12-27 MED ORDER — OXYBUTYNIN CHLORIDE 5 MG PO TABS: 5.0000 mg | ORAL_TABLET | Freq: Three times a day (TID) | ORAL | Status: DC | PRN

## 2023-12-27 MED ORDER — SUGAMMADEX SODIUM 200 MG/2ML IV SOLN
INTRAVENOUS | Status: DC | PRN
  Administered 2023-12-27: 200 mg via INTRAVENOUS

## 2023-12-27 MED ORDER — LACTATED RINGERS IV SOLN: INTRAVENOUS | Status: DC

## 2023-12-27 MED ORDER — TRANEXAMIC ACID-NACL 1000-0.7 MG/100ML-% IV SOLN
1000.0000 mg | Freq: Once | INTRAVENOUS | Status: AC
  Administered 2023-12-27: 1000 mg via INTRAVENOUS
  Filled 2023-12-27: qty 100

## 2023-12-27 MED ORDER — ONDANSETRON HCL 4 MG/2ML IJ SOLN
INTRAMUSCULAR | Status: DC | PRN
  Administered 2023-12-27: 4 mg via INTRAVENOUS

## 2023-12-27 MED ORDER — ROCURONIUM BROMIDE 100 MG/10ML IV SOLN
INTRAVENOUS | Status: DC | PRN
  Administered 2023-12-27: 60 mg via INTRAVENOUS
  Administered 2023-12-27: 20 mg via INTRAVENOUS

## 2023-12-27 MED ORDER — ATENOLOL 50 MG PO TABS
50.0000 mg | ORAL_TABLET | Freq: Every day | ORAL | Status: DC
Start: 2023-12-28 — End: 2023-12-28

## 2023-12-27 MED ORDER — ALLOPURINOL 300 MG PO TABS: 300.0000 mg | ORAL_TABLET | Freq: Every day | ORAL | Status: DC

## 2023-12-27 MED ORDER — OXYCODONE-ACETAMINOPHEN 5-325 MG PO TABS
1.0000 | ORAL_TABLET | ORAL | Status: DC | PRN
  Administered 2023-12-27: 2 via ORAL
  Administered 2023-12-28: 1 via ORAL
  Filled 2023-12-27: qty 1
  Filled 2023-12-27: qty 2

## 2023-12-27 MED ORDER — FENOFIBRATE 160 MG PO TABS: 160.0000 mg | ORAL_TABLET | Freq: Every day | ORAL | Status: DC

## 2023-12-27 MED ORDER — PROPOFOL 10 MG/ML IV BOLUS
INTRAVENOUS | Status: AC
  Filled 2023-12-27: qty 20

## 2023-12-27 MED ORDER — LIDOCAINE HCL (PF) 2 % IJ SOLN
INTRAMUSCULAR | Status: AC
  Filled 2023-12-27: qty 5

## 2023-12-27 MED ORDER — AMLODIPINE BESYLATE 10 MG PO TABS: 10.0000 mg | ORAL_TABLET | Freq: Every day | ORAL | Status: DC

## 2023-12-27 MED ORDER — ACETAMINOPHEN 325 MG PO TABS: 650.0000 mg | ORAL_TABLET | ORAL | Status: DC | PRN

## 2023-12-27 MED ORDER — HYDROMORPHONE HCL 1 MG/ML IJ SOLN
0.5000 mg | INTRAMUSCULAR | Status: DC | PRN
  Administered 2023-12-27 (×2): 1 mg via INTRAVENOUS
  Filled 2023-12-27: qty 1

## 2023-12-27 MED ORDER — LACTATED RINGERS IV SOLN: INTRAVENOUS | Status: DC | PRN

## 2023-12-27 MED ORDER — CEFAZOLIN SODIUM-DEXTROSE 2-3 GM-%(50ML) IV SOLR
INTRAVENOUS | Status: DC | PRN
Start: 2023-12-27 — End: 2023-12-27
  Administered 2023-12-27: 2 g via INTRAVENOUS

## 2023-12-27 MED ORDER — ROCURONIUM BROMIDE 10 MG/ML (PF) SYRINGE
PREFILLED_SYRINGE | INTRAVENOUS | Status: AC
  Filled 2023-12-27: qty 10

## 2023-12-27 MED ORDER — IPRATROPIUM-ALBUTEROL 0.5-2.5 (3) MG/3ML IN SOLN
RESPIRATORY_TRACT | Status: AC
  Filled 2023-12-27: qty 3

## 2023-12-27 MED ORDER — PHENYLEPHRINE 80 MCG/ML (10ML) SYRINGE FOR IV PUSH (FOR BLOOD PRESSURE SUPPORT)
PREFILLED_SYRINGE | INTRAVENOUS | Status: DC | PRN
Start: 2023-12-27 — End: 2023-12-27
  Administered 2023-12-27: 80 ug via INTRAVENOUS

## 2023-12-27 MED ORDER — EPHEDRINE SULFATE-NACL 50-0.9 MG/10ML-% IV SOSY
PREFILLED_SYRINGE | INTRAVENOUS | Status: DC | PRN
  Administered 2023-12-27: 10 mg via INTRAVENOUS

## 2023-12-27 MED ORDER — CHLORHEXIDINE GLUCONATE 0.12 % MT SOLN
15.0000 mL | Freq: Once | OROMUCOSAL | Status: AC
  Administered 2023-12-27: 15 mL via OROMUCOSAL

## 2023-12-27 MED ORDER — DIPHENHYDRAMINE HCL 50 MG/ML IJ SOLN: 12.5000 mg | Freq: Four times a day (QID) | INTRAMUSCULAR | Status: DC | PRN

## 2023-12-27 MED ORDER — LIDOCAINE HCL (CARDIAC) PF 100 MG/5ML IV SOSY
PREFILLED_SYRINGE | INTRAVENOUS | Status: DC | PRN
  Administered 2023-12-27: 40 mg via INTRAVENOUS

## 2023-12-27 MED ORDER — ESCITALOPRAM OXALATE 10 MG PO TABS: 10.0000 mg | ORAL_TABLET | Freq: Every day | ORAL | Status: DC

## 2023-12-27 MED ORDER — DEXAMETHASONE SODIUM PHOSPHATE 10 MG/ML IJ SOLN
INTRAMUSCULAR | Status: DC | PRN
  Administered 2023-12-27: 8 mg via INTRAVENOUS

## 2023-12-27 MED ORDER — OXYCODONE HCL 5 MG/5ML PO SOLN: 5.0000 mg | Freq: Once | ORAL | Status: DC | PRN

## 2023-12-27 MED ORDER — DEXAMETHASONE SODIUM PHOSPHATE 10 MG/ML IJ SOLN
INTRAMUSCULAR | Status: AC
  Filled 2023-12-27: qty 1

## 2023-12-27 MED ORDER — INFLUENZA VAC SPLIT HIGH-DOSE 0.5 ML IM SUSY
0.5000 mL | PREFILLED_SYRINGE | INTRAMUSCULAR | Status: DC
  Filled 2023-12-27: qty 0.5

## 2023-12-27 MED ORDER — ONDANSETRON HCL 4 MG/2ML IJ SOLN: 4.0000 mg | INTRAMUSCULAR | Status: DC | PRN

## 2023-12-27 SURGICAL SUPPLY — 20 items
BAG URINE DRAIN 2000ML AR STRL (UROLOGICAL SUPPLIES) ×2 IMPLANT
BAG URO CATCHER STRL LF (MISCELLANEOUS) ×2 IMPLANT
CATH FOLEY 3WAY 30CC 22FR (CATHETERS) IMPLANT
CATH URETL OPEN 5X70 (CATHETERS) ×2 IMPLANT
CLOTH BEACON ORANGE TIMEOUT ST (SAFETY) ×2 IMPLANT
DRAPE FOOT SWITCH (DRAPES) ×2 IMPLANT
ELECT REM PT RETURN 15FT ADLT (MISCELLANEOUS) ×2 IMPLANT
GLOVE SURG LX STRL 8.0 MICRO (GLOVE) ×2 IMPLANT
GOWN STRL SURGICAL XL XLNG (GOWN DISPOSABLE) ×2 IMPLANT
GUIDEWIRE ZIPWRE .038 STRAIGHT (WIRE) ×2 IMPLANT
HOLDER FOLEY CATH W/STRAP (MISCELLANEOUS) IMPLANT
KIT TURNOVER KIT A (KITS) ×2 IMPLANT
LOOP CUT BIPOLAR 24F LRG (ELECTROSURGICAL) ×2 IMPLANT
MANIFOLD NEPTUNE II (INSTRUMENTS) ×2 IMPLANT
PACK CYSTO (CUSTOM PROCEDURE TRAY) ×2 IMPLANT
PAD PREP 24X48 CUFFED NSTRL (MISCELLANEOUS) ×2 IMPLANT
STENT URET 6FRX24 CONTOUR (STENTS) IMPLANT
SYRINGE TOOMEY IRRIG 70ML (MISCELLANEOUS) ×2 IMPLANT
TUBING CONNECTING 10 (TUBING) ×2 IMPLANT
TUBING UROLOGY SET (TUBING) ×2 IMPLANT

## 2023-12-27 NOTE — Anesthesia Procedure Notes (Signed)
 Procedure Name: Intubation Date/Time: 12/27/2023 10:14 AM  Performed by: Deeann Eva BROCKS, CRNAPre-anesthesia Checklist: Patient identified, Emergency Drugs available, Suction available and Patient being monitored Patient Re-evaluated:Patient Re-evaluated prior to induction Oxygen Delivery Method: Circle System Utilized Preoxygenation: Pre-oxygenation with 100% oxygen Induction Type: IV induction Ventilation: Mask ventilation without difficulty Laryngoscope Size: Mac and 3 Grade View: Grade I Tube type: Oral Tube size: 8.0 mm Number of attempts: 1 Airway Equipment and Method: Stylet Placement Confirmation: ETT inserted through vocal cords under direct vision, positive ETCO2 and breath sounds checked- equal and bilateral Secured at: 23 cm Tube secured with: Tape Dental Injury: Teeth and Oropharynx as per pre-operative assessment

## 2023-12-27 NOTE — Anesthesia Postprocedure Evaluation (Signed)
 Anesthesia Post Note  Patient: KAYSEN DEAL  Procedure(s) Performed: TURP (TRANSURETHRAL RESECTION OF PROSTATE) TURBT (TRANSURETHRAL RESECTION OF BLADDER TUMOR) CYSTOSCOPY, WITH STENT INSERTION, RIGHT RETROGRADE (Right)     Patient location during evaluation: PACU Anesthesia Type: General Level of consciousness: awake and alert and oriented Pain management: pain level controlled Vital Signs Assessment: post-procedure vital signs reviewed and stable Respiratory status: spontaneous breathing, nonlabored ventilation and respiratory function stable Cardiovascular status: blood pressure returned to baseline and stable Postop Assessment: no apparent nausea or vomiting Anesthetic complications: no   No notable events documented.  Last Vitals:  Vitals:   12/27/23 1215 12/27/23 1230  BP: (!) 128/48 (!) 128/44  Pulse: (!) 57 (!) 53  Resp: 12 12  Temp:  (!) 36.1 C  SpO2: 92% 93%    Last Pain:  Vitals:   12/27/23 1230  TempSrc:   PainSc: 0-No pain                 Shilynn Hoch A.

## 2023-12-27 NOTE — Op Note (Signed)
 Operative Note  Preoperative diagnosis:  1.  2 cm papillary bladder tumor involving the right sidewall and right ureteral orifice 2.  BPH with LUTS 3.  History of pT3a clear-cell renal cell carcinoma, s/p left robotic nephrectomy on 07/08/2022  Postoperative diagnosis: 1.  2 cm papillary bladder tumor involving the right sidewall and right ureteral orifice 2.  BPH with LUTS 3.  History of pT3a clear-cell renal cell carcinoma, s/p left robotic nephrectomy on 07/08/2022  Procedure(s): 1.  Cystoscopy with TURBT (medium), TURP and right ureteral stent placement 2.  Right retrograde pyelogram with intraoperative interpretation of fluoroscopic imaging  Surgeon: Lonni Han, MD  Assistants:  None  Anesthesia:  General  Complications:  None  EBL: 50 mL  Specimens: 1.  Superficial and deep margins of right sidewall bladder tumor 2.  Prostate chips  Drains/Catheters: 1.  Right 6 French, 24 cm JJ stent without tether 2.  22 French three-way Foley catheter with 30 mL of sterile water in the balloon  Intraoperative findings:   2 cm papillary bladder tumor involving the right sidewall and right ureteral orifice Solitary right collecting system with no filling defects or dilation involving the right ureter or right renal pelvis seen on retrograde pyelogram Trilobar prostatic urethral obstruction Urine was draining clear to light pink with CBI at the conclusion of the case  Indication:  Bobby Nguyen is a 78 y.o. male with a 2 cm papillary bladder tumor involving the right sidewall and right ureteral orifice along with worsening BPH with LUTS.  He has been consented for the above procedures, voices understanding and wishes to proceed.  Description of procedure:  The patient was taken to the operating room and administered general anesthesia. They were then placed on the table and moved to the dorsal lithotomy position after which the genitalia was sterilely prepped and draped. An  official timeout was then performed.  The 85 French resectoscope with the 30 lens and visual obturator were then passed into the bladder under direct visualization. Urethra appeared normal. The visual obturator was then removed and the Gyrus resectoscope element with 30  lens was then inserted and the bladder was fully and systematically inspected. Ureteral orifices were noted to be in the normal anatomic positions.   A complete bladder survey revealed a 2 cm papillary bladder tumor involving the right sidewall and directly involving the right ureteral orifice.  The bipolar loop was then used to resect the tumor down to the detrusor musculature.  Superficial and deep margins of the tumor were sent for permanent section.  There was no evidence of bladder perforation following resection.  The area of resection was extensively fulgurated until hemostasis was achieved.  The resectoscope was then removed and a rigid cystoscope was then replaced back into the bladder.  A 5 French ureteral catheter was then inserted into the into the resected right ureteral orifice and a retrograde pyelogram was obtained, with the findings listed above.  A Glidewire was then advanced through the lumen of the ureteral catheter up to the right renal pelvis, and fluoroscopic guidance.  The ureteral catheter was then removed, leaving the wire in place.  A 6 French, 24 cm JJ stent was then placed over the Glidewire and into good position within the right collecting system, confirming placement via fluoroscopy.  The rigid cystoscope was then removed and the 26 French resectoscope with a bipolar loop working element is then reinserted into the bladder.  Starting at the bladder neck and progressing distally to  the verumontanum, the prostatic adenoma was systematically resected until a widely patent prostatic urethral channel was created.  All prostate chips were then hand irrigated out of the bladder and sent to pathology for permanent  section.  The area of resection was then extensively fulgurated until hemostasis was achieved.  The resectoscope was then removed and exchanged for a 22 French three-way Foley catheter.  The three-way Foley catheter was then extensively hand irrigated until the irrigant returned clear to light pink.  The catheter was then placed to continuous bladder irrigation and placed on rubber band traction.  He tolerated the procedure well and was transferred to the postanesthesia unit in stable condition.  Plan:  CBI overnight.  Follow-up on 01/02/2024 for a voiding trial.  He will need to leave his right ureteral stent in for 3 weeks postoperatively and will need an additional follow-up appointment for an office cystoscopy and stent removal at that time.

## 2023-12-27 NOTE — Transfer of Care (Signed)
 Immediate Anesthesia Transfer of Care Note  Patient: Bobby Nguyen  Procedure(s) Performed: TURP (TRANSURETHRAL RESECTION OF PROSTATE) TURBT (TRANSURETHRAL RESECTION OF BLADDER TUMOR) CYSTOSCOPY, WITH STENT INSERTION, RIGHT RETROGRADE (Right)  Patient Location: PACU  Anesthesia Type:General  Level of Consciousness: awake, alert , and oriented  Airway & Oxygen Therapy: Patient Spontanous Breathing and Patient connected to face mask oxygen  Post-op Assessment: Report given to RN and Post -op Vital signs reviewed and stable  Post vital signs: Reviewed and stable  Last Vitals:  Vitals Value Taken Time  BP 134/50 12/27/23 11:39  Temp    Pulse 65 12/27/23 11:41  Resp 14 12/27/23 11:41  SpO2 98 % 12/27/23 11:41  Vitals shown include unfiled device data.  Last Pain:  Vitals:   12/27/23 0908  TempSrc: Oral  PainSc: 0-No pain         Complications: No notable events documented.

## 2023-12-28 ENCOUNTER — Encounter (HOSPITAL_COMMUNITY): Payer: Self-pay | Admitting: Urology

## 2023-12-28 DIAGNOSIS — I129 Hypertensive chronic kidney disease with stage 1 through stage 4 chronic kidney disease, or unspecified chronic kidney disease: Secondary | ICD-10-CM | POA: Diagnosis not present

## 2023-12-28 DIAGNOSIS — Z79899 Other long term (current) drug therapy: Secondary | ICD-10-CM | POA: Diagnosis not present

## 2023-12-28 DIAGNOSIS — Z85528 Personal history of other malignant neoplasm of kidney: Secondary | ICD-10-CM | POA: Diagnosis not present

## 2023-12-28 DIAGNOSIS — R3915 Urgency of urination: Secondary | ICD-10-CM | POA: Diagnosis not present

## 2023-12-28 DIAGNOSIS — C672 Malignant neoplasm of lateral wall of bladder: Secondary | ICD-10-CM | POA: Diagnosis not present

## 2023-12-28 DIAGNOSIS — N183 Chronic kidney disease, stage 3 unspecified: Secondary | ICD-10-CM | POA: Diagnosis not present

## 2023-12-28 DIAGNOSIS — F17210 Nicotine dependence, cigarettes, uncomplicated: Secondary | ICD-10-CM | POA: Diagnosis not present

## 2023-12-28 LAB — HEMOGLOBIN AND HEMATOCRIT, BLOOD
HCT: 41.6 % (ref 39.0–52.0)
Hemoglobin: 13.4 g/dL (ref 13.0–17.0)

## 2023-12-28 LAB — BASIC METABOLIC PANEL WITH GFR
Anion gap: 11 (ref 5–15)
BUN: 28 mg/dL — ABNORMAL HIGH (ref 8–23)
CO2: 20 mmol/L — ABNORMAL LOW (ref 22–32)
Calcium: 9.1 mg/dL (ref 8.9–10.3)
Chloride: 105 mmol/L (ref 98–111)
Creatinine, Ser: 1.53 mg/dL — ABNORMAL HIGH (ref 0.61–1.24)
GFR, Estimated: 46 mL/min — ABNORMAL LOW (ref >60)
Glucose, Bld: 122 mg/dL — ABNORMAL HIGH (ref 70–99)
Potassium: 5 mmol/L (ref 3.5–5.1)
Sodium: 136 mmol/L (ref 135–145)

## 2023-12-28 LAB — SURGICAL PATHOLOGY

## 2023-12-28 MED ORDER — PHENAZOPYRIDINE HCL 200 MG PO TABS: 200.0000 mg | ORAL_TABLET | Freq: Three times a day (TID) | ORAL | 0 refills | Status: AC | PRN

## 2023-12-28 MED ORDER — OXYBUTYNIN CHLORIDE 5 MG PO TABS: 5.0000 mg | ORAL_TABLET | Freq: Three times a day (TID) | ORAL | 1 refills | Status: AC | PRN

## 2023-12-28 NOTE — Discharge Summary (Signed)
 Date of admission: 12/27/2023  Date of discharge: 12/28/2023  Admission diagnosis: 1.  Bladder tumor 2.  BPH with LUTS  Discharge diagnosis: Same  Procedures: Cystoscopy with TURBT, TURP and right ureteral stent placement  History and Physical: For full details, please see admission history and physical. Briefly, Bobby Nguyen is a 78 y.o. year old patient with a 2 cm papillary bladder tumor with features concerning for UCC along with worsening BPH w/ LUTS.  He is s/p TURBT, TURP and right ureteral stent placement on 12/27/23.   Hospital Course: Routine post-op course following the above procedures.  He will be discharged home with his Foley catheter.  Voiding trial in 1 week and stent removal in 3 weeks.   Physical Exam:  General: Alert and oriented CV: RRR, palpable distal pulses Lungs: CTAB, equal chest rise Abdomen: Soft, NTND, no rebound or guarding GU:  Foley in place and draining clear urine w/o CBI  Laboratory values:  Recent Labs    12/28/23 0455  HGB 13.4  HCT 41.6   Recent Labs    12/28/23 0455  CREATININE 1.53*    Disposition: Home  Discharge instruction: The patient was instructed to be ambulatory but told to refrain from heavy lifting, strenuous activity, or driving.  Discharge medications:  Allergies as of 12/28/2023       Reactions   Atorvastatin Other (See Comments)   constipation   Pravastatin Sodium Other (See Comments)   sweating   Solifenacin Other (See Comments)   sweating   Penicillins Itching, Swelling, Rash   Peripheral swelling        Medication List     TAKE these medications    allopurinol  300 MG tablet Commonly known as: ZYLOPRIM  Take 300 mg by mouth in the morning.   amLODipine 10 MG tablet Commonly known as: NORVASC Take 10 mg by mouth in the morning.   atenolol  50 MG tablet Commonly known as: TENORMIN  Take 50 mg by mouth in the morning.   bisacodyl 5 MG EC tablet Commonly known as: DULCOLAX Take 10 mg by mouth in  the morning.   ciclopirox  8 % solution Commonly known as: PENLAC  Apply topically 2 (two) times daily as needed. Apply over nail and surrounding skin. Apply daily over previous coat. After seven (7) days, may remove with alcohol and continue cycle.   escitalopram  10 MG tablet Commonly known as: LEXAPRO  Take 10 mg by mouth daily.   fenofibrate  160 MG tablet Take 160 mg by mouth in the morning.   oxybutynin 5 MG tablet Commonly known as: DITROPAN Take 1 tablet (5 mg total) by mouth every 8 (eight) hours as needed for bladder spasms.   oxyCODONE -acetaminophen  10-325 MG tablet Commonly known as: PERCOCET Take 1 tablet by mouth 4 (four) times daily as needed for pain.   phenazopyridine 200 MG tablet Commonly known as: Pyridium Take 1 tablet (200 mg total) by mouth 3 (three) times daily as needed (for pain with urination).        Followup:   Follow-up Information     [facility] Follow up on 01/02/2024.   Why: Postop appointment for catheter removal at 11 AM Contact information: [address] [place] [address] [place]  [phone]

## 2023-12-28 NOTE — TOC Transition Note (Signed)
 Transition of Care [facility]) - Discharge Note   Patient Details  Name: Bobby Nguyen MRN: [id number] Date of Birth: 11/02/1945  Transition of [facility] CM/SW Contact:  Bascom Service, RN Phone Number: 12/28/2023, 9:50 AM   Clinical Narrative: d/c home w/foley cath.No CM needs.      Final next level of care: Home/Self Care Barriers to Discharge: No Barriers Identified   Patient Goals and CMS Choice Patient states their goals for this hospitalization and ongoing recovery are:: Home          Discharge Placement                       Discharge Plan and Services Additional resources added to the After Visit Summary for                                       Social Drivers of Health (SDOH) Interventions SDOH Screenings   Food Insecurity: No Food Insecurity (12/27/2023)  Housing: Low Risk  (12/27/2023)  Transportation Needs: No Transportation Needs (12/27/2023)  Utilities: Not At Risk (12/27/2023)  Social Connections: Moderately Isolated (12/27/2023)  Tobacco Use: High Risk (12/27/2023)     Readmission Risk Interventions     No data to display

## 2024-01-02 DIAGNOSIS — R3915 Urgency of urination: Secondary | ICD-10-CM | POA: Diagnosis not present

## 2024-01-02 DIAGNOSIS — C672 Malignant neoplasm of lateral wall of bladder: Secondary | ICD-10-CM | POA: Diagnosis not present

## 2024-01-15 DIAGNOSIS — Z466 Encounter for fitting and adjustment of urinary device: Secondary | ICD-10-CM | POA: Diagnosis not present

## 2024-01-15 DIAGNOSIS — C678 Malignant neoplasm of overlapping sites of bladder: Secondary | ICD-10-CM | POA: Diagnosis not present

## 2024-01-25 ENCOUNTER — Ambulatory Visit: Admitting: Podiatry

## 2024-01-25 DIAGNOSIS — M109 Gout, unspecified: Secondary | ICD-10-CM | POA: Diagnosis not present

## 2024-01-25 DIAGNOSIS — Z23 Encounter for immunization: Secondary | ICD-10-CM | POA: Diagnosis not present

## 2024-01-25 DIAGNOSIS — D692 Other nonthrombocytopenic purpura: Secondary | ICD-10-CM | POA: Diagnosis not present

## 2024-01-25 DIAGNOSIS — Z85528 Personal history of other malignant neoplasm of kidney: Secondary | ICD-10-CM | POA: Diagnosis not present

## 2024-01-25 DIAGNOSIS — J8410 Pulmonary fibrosis, unspecified: Secondary | ICD-10-CM | POA: Diagnosis not present

## 2024-01-25 DIAGNOSIS — C689 Malignant neoplasm of urinary organ, unspecified: Secondary | ICD-10-CM | POA: Diagnosis not present

## 2024-01-25 DIAGNOSIS — J439 Emphysema, unspecified: Secondary | ICD-10-CM | POA: Diagnosis not present

## 2024-01-25 DIAGNOSIS — N1831 Chronic kidney disease, stage 3a: Secondary | ICD-10-CM | POA: Diagnosis not present

## 2024-01-25 DIAGNOSIS — I10 Essential (primary) hypertension: Secondary | ICD-10-CM | POA: Diagnosis not present

## 2024-01-25 DIAGNOSIS — J479 Bronchiectasis, uncomplicated: Secondary | ICD-10-CM | POA: Diagnosis not present

## 2024-01-25 DIAGNOSIS — E782 Mixed hyperlipidemia: Secondary | ICD-10-CM | POA: Diagnosis not present

## 2024-02-08 ENCOUNTER — Ambulatory Visit: Admitting: Podiatry

## 2024-02-08 DIAGNOSIS — M79674 Pain in right toe(s): Secondary | ICD-10-CM

## 2024-02-08 DIAGNOSIS — B351 Tinea unguium: Secondary | ICD-10-CM | POA: Diagnosis not present

## 2024-02-08 DIAGNOSIS — M79675 Pain in left toe(s): Secondary | ICD-10-CM

## 2024-02-08 NOTE — Progress Notes (Signed)
 Subjective:  Patient ID: Bobby Nguyen, male    DOB: 1946-02-23,  MRN: [id number]  Bobby Nguyen presents to clinic today for:  Chief Complaint  Patient presents with   [facility]    Not diabetic and no anticoag. Patient has been using the ciclopirox  solution on BL 1st nails. He has not been removing it so there is a significant amount built up on the nail.    Patient notes nails are thick, discolored, elongated and painful in shoegear when trying to ambulate.  He has been using ciclopirox  on the toenails daily.  He did not realize he needed to take it off once per week.  The nail lacquer has been building up since last seen.  PCP is Hamrick, Charlene CROME, MD.  Past Medical History:  Diagnosis Date   Aortic atherosclerosis    Arthritis    Back pain    Cancer (HCC)    skin cancer,right ear.   Chronic kidney disease    COPD (chronic obstructive pulmonary disease) (HCC)    Depression    Gout    Hyperlipidemia    Hypertension    Pneumonia    Stroke [facility])    Ischemic MCA CVA   Past Surgical History:  Procedure Laterality Date   arm surgery Left    fracture surgery.Plate and screws in.   BACK SURGERY  2010   Thoracic fusion, kyphoplasty   CATARACT EXTRACTION W/ INTRAOCULAR LENS IMPLANT Bilateral    CYSTOSCOPY WITH STENT PLACEMENT Right 12/27/2023   Procedure: CYSTOSCOPY, WITH STENT INSERTION, RIGHT RETROGRADE;  Surgeon: Devere Lonni Righter, MD;  Location: [facility];  Service: Urology;  Laterality: Right;   EP IMPLANTABLE DEVICE N/A 10/06/2015   Procedure: Loop Recorder Insertion;  Surgeon: Bobby Rakers, MD;  Location: [facility] INVASIVE CV LAB;  Service: Cardiovascular;  Laterality: N/A;   FRACTURE SURGERY Left    ORIF wrist,   ROBOT ASSISTED LAPAROSCOPIC NEPHRECTOMY Left 07/08/2022   Procedure: XI ROBOTIC ASSISTED LEFT LAPAROSCOPIC NEPHRECTOMY;  Surgeon: Devere Lonni Righter, MD;  Location: [facility];  Service: Urology;  Laterality: Left;   TEE WITHOUT CARDIOVERSION N/A 10/08/2015    Procedure: TRANSESOPHAGEAL ECHOCARDIOGRAM (TEE);  Surgeon: Vina LULLA Gull, MD;  Location: [facility] ENDOSCOPY;  Service: Cardiovascular;  Laterality: N/A;   TRANSURETHRAL RESECTION OF BLADDER TUMOR N/A 12/27/2023   Procedure: TURBT (TRANSURETHRAL RESECTION OF BLADDER TUMOR);  Surgeon: Devere Lonni Righter, MD;  Location: [facility];  Service: Urology;  Laterality: N/A;   TRANSURETHRAL RESECTION OF PROSTATE N/A 12/27/2023   Procedure: TURP (TRANSURETHRAL RESECTION OF PROSTATE);  Surgeon: Devere Lonni Righter, MD;  Location: [facility];  Service: Urology;  Laterality: N/A;  TURP (TRANSURETHRAL RESECTION OF PROSTATE), TURBT (TRANSURETHRAL RESECTION OF BLADDER TUMOR), POSSIBLECYSTOSCOPY WITH RIGHT STENT INSERTION   Allergies  Allergen Reactions   Atorvastatin Other (See Comments)    constipation   Pravastatin Sodium Other (See Comments)    sweating   Solifenacin Other (See Comments)    sweating   Penicillins Itching, Swelling and Rash    Peripheral swelling    Review of Systems: Negative except as noted in the HPI.  Objective:  Bobby Nguyen is a pleasant 78 y.o. male in NAD. AAO x 3.  Vascular Examination: Capillary refill time is 3-5 seconds to toes bilateral. Palpable pedal pulses b/l LE. Digital hair present b/l.  Skin temperature gradient WNL b/l. No varicosities b/l. No cyanosis noted b/l.   Dermatological Examination: Pedal skin with normal turgor, texture and tone b/l. No open wounds.  No interdigital macerations b/l. Toenails x10 are 3mm thick, discolored, dystrophic with subungual debris. There is pain with compression of the nail plates.  They are elongated x10  Assessment/Plan: 1. Pain due to onychomycosis of toenails of both feet    The mycotic toenails were sharply debrided x10 with sterile nail nippers and a power debriding burr to decrease bulk/thickness and length.  Once the lacquer was debrided from the nails, he was informed that there is significant improvement since last seen.   He also agreed.  He will continue with the ciclopirox  solution daily.  Return in about 3 months (around 05/10/2024) for fungal nail recheck, RFC.   Awanda CHARM Imperial, DPM, FACFAS [facility]     [address] [place], [place] [address]                Office [phone]  Fax [phone]

## 2024-02-22 ENCOUNTER — Encounter (HOSPITAL_COMMUNITY): Payer: Self-pay

## 2024-02-22 ENCOUNTER — Emergency Department (HOSPITAL_COMMUNITY)

## 2024-02-22 ENCOUNTER — Other Ambulatory Visit: Payer: Self-pay

## 2024-02-22 ENCOUNTER — Inpatient Hospital Stay (HOSPITAL_COMMUNITY)
Admission: EM | Admit: 2024-02-22 | Discharge: 2024-02-24 | DRG: 071 | Disposition: A | Discharge status: Home / Self Care | Attending: Internal Medicine | Admitting: Internal Medicine

## 2024-02-22 DIAGNOSIS — Z23 Encounter for immunization: Secondary | ICD-10-CM | POA: Diagnosis present

## 2024-02-22 DIAGNOSIS — Z905 Acquired absence of kidney: Secondary | ICD-10-CM

## 2024-02-22 DIAGNOSIS — S5001XA Contusion of right elbow, initial encounter: Secondary | ICD-10-CM | POA: Diagnosis present

## 2024-02-22 DIAGNOSIS — N3001 Acute cystitis with hematuria: Principal | ICD-10-CM | POA: Diagnosis present

## 2024-02-22 DIAGNOSIS — S22009A Unspecified fracture of unspecified thoracic vertebra, initial encounter for closed fracture: Secondary | ICD-10-CM | POA: Diagnosis present

## 2024-02-22 DIAGNOSIS — F17210 Nicotine dependence, cigarettes, uncomplicated: Secondary | ICD-10-CM | POA: Diagnosis present

## 2024-02-22 DIAGNOSIS — I129 Hypertensive chronic kidney disease with stage 1 through stage 4 chronic kidney disease, or unspecified chronic kidney disease: Secondary | ICD-10-CM | POA: Diagnosis present

## 2024-02-22 DIAGNOSIS — N1831 Chronic kidney disease, stage 3a: Secondary | ICD-10-CM | POA: Diagnosis present

## 2024-02-22 DIAGNOSIS — R259 Unspecified abnormal involuntary movements: Secondary | ICD-10-CM | POA: Diagnosis present

## 2024-02-22 DIAGNOSIS — R001 Bradycardia, unspecified: Secondary | ICD-10-CM | POA: Diagnosis present

## 2024-02-22 DIAGNOSIS — Z9841 Cataract extraction status, right eye: Secondary | ICD-10-CM

## 2024-02-22 DIAGNOSIS — Z8673 Personal history of transient ischemic attack (TIA), and cerebral infarction without residual deficits: Secondary | ICD-10-CM

## 2024-02-22 DIAGNOSIS — M109 Gout, unspecified: Secondary | ICD-10-CM | POA: Diagnosis present

## 2024-02-22 DIAGNOSIS — I493 Ventricular premature depolarization: Secondary | ICD-10-CM | POA: Diagnosis not present

## 2024-02-22 DIAGNOSIS — Z888 Allergy status to other drugs, medicaments and biological substances status: Secondary | ICD-10-CM

## 2024-02-22 DIAGNOSIS — G9341 Metabolic encephalopathy: Principal | ICD-10-CM | POA: Diagnosis present

## 2024-02-22 DIAGNOSIS — S50311A Abrasion of right elbow, initial encounter: Secondary | ICD-10-CM | POA: Diagnosis present

## 2024-02-22 DIAGNOSIS — Z981 Arthrodesis status: Secondary | ICD-10-CM

## 2024-02-22 DIAGNOSIS — W19XXXA Unspecified fall, initial encounter: Secondary | ICD-10-CM | POA: Diagnosis not present

## 2024-02-22 DIAGNOSIS — R296 Repeated falls: Secondary | ICD-10-CM | POA: Diagnosis present

## 2024-02-22 DIAGNOSIS — I700 Atherosclerosis of aorta: Secondary | ICD-10-CM | POA: Diagnosis present

## 2024-02-22 DIAGNOSIS — Z87440 Personal history of urinary (tract) infections: Secondary | ICD-10-CM

## 2024-02-22 DIAGNOSIS — E785 Hyperlipidemia, unspecified: Secondary | ICD-10-CM | POA: Diagnosis present

## 2024-02-22 DIAGNOSIS — J439 Emphysema, unspecified: Secondary | ICD-10-CM | POA: Diagnosis present

## 2024-02-22 DIAGNOSIS — M51379 Other intervertebral disc degeneration, lumbosacral region without mention of lumbar back pain or lower extremity pain: Secondary | ICD-10-CM | POA: Diagnosis present

## 2024-02-22 DIAGNOSIS — Z85828 Personal history of other malignant neoplasm of skin: Secondary | ICD-10-CM

## 2024-02-22 DIAGNOSIS — J209 Acute bronchitis, unspecified: Secondary | ICD-10-CM | POA: Diagnosis present

## 2024-02-22 DIAGNOSIS — G894 Chronic pain syndrome: Secondary | ICD-10-CM | POA: Diagnosis present

## 2024-02-22 DIAGNOSIS — K7689 Other specified diseases of liver: Secondary | ICD-10-CM | POA: Diagnosis present

## 2024-02-22 DIAGNOSIS — Z8701 Personal history of pneumonia (recurrent): Secondary | ICD-10-CM

## 2024-02-22 DIAGNOSIS — C679 Malignant neoplasm of bladder, unspecified: Secondary | ICD-10-CM | POA: Diagnosis present

## 2024-02-22 DIAGNOSIS — S00412A Abrasion of left ear, initial encounter: Secondary | ICD-10-CM | POA: Diagnosis present

## 2024-02-22 DIAGNOSIS — N3000 Acute cystitis without hematuria: Secondary | ICD-10-CM | POA: Diagnosis present

## 2024-02-22 DIAGNOSIS — Z9079 Acquired absence of other genital organ(s): Secondary | ICD-10-CM

## 2024-02-22 DIAGNOSIS — J470 Bronchiectasis with acute lower respiratory infection: Secondary | ICD-10-CM | POA: Diagnosis present

## 2024-02-22 DIAGNOSIS — M4854XA Collapsed vertebra, not elsewhere classified, thoracic region, initial encounter for fracture: Secondary | ICD-10-CM | POA: Diagnosis present

## 2024-02-22 DIAGNOSIS — S00432A Contusion of left ear, initial encounter: Secondary | ICD-10-CM | POA: Diagnosis present

## 2024-02-22 DIAGNOSIS — Z880 Allergy status to penicillin: Secondary | ICD-10-CM

## 2024-02-22 DIAGNOSIS — Z9842 Cataract extraction status, left eye: Secondary | ICD-10-CM

## 2024-02-22 DIAGNOSIS — D649 Anemia, unspecified: Secondary | ICD-10-CM | POA: Diagnosis present

## 2024-02-22 DIAGNOSIS — J9811 Atelectasis: Secondary | ICD-10-CM | POA: Diagnosis present

## 2024-02-22 DIAGNOSIS — J440 Chronic obstructive pulmonary disease with acute lower respiratory infection: Secondary | ICD-10-CM | POA: Diagnosis present

## 2024-02-22 DIAGNOSIS — N400 Enlarged prostate without lower urinary tract symptoms: Secondary | ICD-10-CM | POA: Diagnosis present

## 2024-02-22 DIAGNOSIS — J432 Centrilobular emphysema: Secondary | ICD-10-CM | POA: Diagnosis present

## 2024-02-22 DIAGNOSIS — M1990 Unspecified osteoarthritis, unspecified site: Secondary | ICD-10-CM | POA: Diagnosis present

## 2024-02-22 DIAGNOSIS — Z961 Presence of intraocular lens: Secondary | ICD-10-CM | POA: Diagnosis present

## 2024-02-22 DIAGNOSIS — Z79899 Other long term (current) drug therapy: Secondary | ICD-10-CM

## 2024-02-22 DIAGNOSIS — Z85528 Personal history of other malignant neoplasm of kidney: Secondary | ICD-10-CM

## 2024-02-22 DIAGNOSIS — J438 Other emphysema: Secondary | ICD-10-CM | POA: Diagnosis present

## 2024-02-22 LAB — COMPREHENSIVE METABOLIC PANEL WITH GFR
ALT: <5 U/L (ref 0–44)
AST: 25 U/L (ref 15–41)
Albumin: 4.3 g/dL (ref 3.5–5.0)
Alkaline Phosphatase: 65 U/L (ref 38–126)
Anion gap: 13 (ref 5–15)
BUN: 23 mg/dL (ref 8–23)
CO2: 23 mmol/L (ref 22–32)
Calcium: 10 mg/dL (ref 8.9–10.3)
Chloride: 103 mmol/L (ref 98–111)
Creatinine, Ser: 1.46 mg/dL — ABNORMAL HIGH (ref 0.61–1.24)
GFR, Estimated: 49 mL/min — ABNORMAL LOW (ref >60)
Glucose, Bld: 101 mg/dL — ABNORMAL HIGH (ref 70–99)
Potassium: 3.9 mmol/L (ref 3.5–5.1)
Sodium: 139 mmol/L (ref 135–145)
Total Bilirubin: 0.5 mg/dL (ref 0.0–1.2)
Total Protein: 7.8 g/dL (ref 6.5–8.1)

## 2024-02-22 LAB — CBC WITH DIFFERENTIAL/PLATELET
Abs Immature Granulocytes: 0.06 K/uL (ref 0.00–0.07)
Basophils Absolute: 0.1 K/uL (ref 0.0–0.1)
Basophils Relative: 1 %
Eosinophils Absolute: 0.2 K/uL (ref 0.0–0.5)
Eosinophils Relative: 2 %
HCT: 44.6 % (ref 39.0–52.0)
Hemoglobin: 14.6 g/dL (ref 13.0–17.0)
Immature Granulocytes: 1 %
Lymphocytes Relative: 16 %
Lymphs Abs: 2 K/uL (ref 0.7–4.0)
MCH: 30.9 pg (ref 26.0–34.0)
MCHC: 32.7 g/dL (ref 30.0–36.0)
MCV: 94.3 fL (ref 80.0–100.0)
Monocytes Absolute: 0.8 K/uL (ref 0.1–1.0)
Monocytes Relative: 7 %
Neutro Abs: 9.5 K/uL — ABNORMAL HIGH (ref 1.7–7.7)
Neutrophils Relative %: 73 %
Platelets: 255 K/uL (ref 150–400)
RBC: 4.73 MIL/uL (ref 4.22–5.81)
RDW: 15 % (ref 11.5–15.5)
WBC: 12.6 K/uL — ABNORMAL HIGH (ref 4.0–10.5)
nRBC: 0 % (ref 0.0–0.2)

## 2024-02-22 LAB — URINALYSIS, W/ REFLEX TO CULTURE (INFECTION SUSPECTED)
Bilirubin Urine: NEGATIVE
Glucose, UA: NEGATIVE mg/dL
Ketones, ur: NEGATIVE mg/dL
Nitrite: POSITIVE — AB
Protein, ur: 30 mg/dL — AB
Specific Gravity, Urine: 1.006 (ref 1.005–1.030)
WBC, UA: >50 WBC/hpf (ref 0–5)
pH: 6 (ref 5.0–8.0)

## 2024-02-22 LAB — TROPONIN T, HIGH SENSITIVITY
Troponin T High Sensitivity: 16 ng/L (ref 0–19)
Troponin T High Sensitivity: 18 ng/L (ref 0–19)

## 2024-02-22 LAB — I-STAT CG4 LACTIC ACID, ED: Lactic Acid, Venous: 1 mmol/L (ref 0.5–1.9)

## 2024-02-22 LAB — PROTIME-INR
INR: 0.9 (ref 0.8–1.2)
Prothrombin Time: 13 s (ref 11.4–15.2)

## 2024-02-22 MED ORDER — TETANUS-DIPHTH-ACELL PERTUSSIS 5-2-15.5 LF-MCG/0.5 IM SUSP
0.5000 mL | Freq: Once | INTRAMUSCULAR | Status: AC
  Administered 2024-02-22: 0.5 mL via INTRAMUSCULAR
  Filled 2024-02-22: qty 0.5

## 2024-02-22 MED ORDER — SODIUM CHLORIDE 0.9 % IV SOLN
500.0000 mg | Freq: Once | INTRAVENOUS | Status: AC
  Administered 2024-02-22: 500 mg via INTRAVENOUS
  Filled 2024-02-22: qty 5

## 2024-02-22 MED ORDER — MORPHINE SULFATE (PF) 4 MG/ML IV SOLN
4.0000 mg | Freq: Once | INTRAVENOUS | Status: AC
  Administered 2024-02-22: 4 mg via INTRAVENOUS
  Filled 2024-02-22: qty 1

## 2024-02-22 MED ORDER — SODIUM CHLORIDE 0.9 % IV SOLN
1.0000 g | Freq: Once | INTRAVENOUS | Status: AC
  Administered 2024-02-22: 1 g via INTRAVENOUS
  Filled 2024-02-22: qty 10

## 2024-02-22 MED ORDER — LACTATED RINGERS IV BOLUS
1000.0000 mL | Freq: Once | INTRAVENOUS | Status: AC
  Administered 2024-02-22: 1000 mL via INTRAVENOUS

## 2024-02-22 MED ORDER — OXYCODONE HCL 5 MG PO TABS
10.0000 mg | ORAL_TABLET | Freq: Once | ORAL | Status: AC
  Administered 2024-02-22: 10 mg via ORAL
  Filled 2024-02-22: qty 2

## 2024-02-22 MED ORDER — ENOXAPARIN SODIUM 40 MG/0.4ML IJ SOSY
40.0000 mg | PREFILLED_SYRINGE | INTRAMUSCULAR | Status: DC
  Administered 2024-02-22 – 2024-02-23 (×2): 40 mg via SUBCUTANEOUS
  Filled 2024-02-22 (×2): qty 0.4

## 2024-02-22 MED ORDER — ACETAMINOPHEN 650 MG RE SUPP: 650.0000 mg | Freq: Four times a day (QID) | RECTAL | Status: DC | PRN

## 2024-02-22 MED ORDER — ALLOPURINOL 100 MG PO TABS
300.0000 mg | ORAL_TABLET | Freq: Every morning | ORAL | Status: DC
  Administered 2024-02-23 – 2024-02-24 (×2): 300 mg via ORAL
  Filled 2024-02-22 (×2): qty 3

## 2024-02-22 MED ORDER — AMLODIPINE BESYLATE 10 MG PO TABS
10.0000 mg | ORAL_TABLET | Freq: Every morning | ORAL | Status: DC
  Administered 2024-02-22: 10 mg via ORAL
  Filled 2024-02-22 (×2): qty 1

## 2024-02-22 MED ORDER — ESCITALOPRAM OXALATE 10 MG PO TABS
10.0000 mg | ORAL_TABLET | Freq: Every day | ORAL | Status: DC
  Administered 2024-02-22 – 2024-02-23 (×2): 10 mg via ORAL
  Filled 2024-02-22 (×2): qty 1

## 2024-02-22 MED ORDER — FENOFIBRATE 160 MG PO TABS
160.0000 mg | ORAL_TABLET | Freq: Every morning | ORAL | Status: DC
  Administered 2024-02-22 – 2024-02-24 (×3): 160 mg via ORAL
  Filled 2024-02-22 (×3): qty 1

## 2024-02-22 MED ORDER — ONDANSETRON HCL 4 MG/2ML IJ SOLN: 4.0000 mg | Freq: Four times a day (QID) | INTRAMUSCULAR | Status: DC | PRN

## 2024-02-22 MED ORDER — OXYCODONE HCL 5 MG PO TABS
10.0000 mg | ORAL_TABLET | Freq: Four times a day (QID) | ORAL | Status: AC | PRN
  Administered 2024-02-22 – 2024-02-23 (×3): 10 mg via ORAL
  Filled 2024-02-22 (×3): qty 2

## 2024-02-22 MED ORDER — ATENOLOL 50 MG PO TABS
50.0000 mg | ORAL_TABLET | Freq: Every morning | ORAL | Status: DC
  Administered 2024-02-22: 50 mg via ORAL
  Filled 2024-02-22 (×2): qty 1

## 2024-02-22 MED ORDER — ACETAMINOPHEN 325 MG PO TABS
650.0000 mg | ORAL_TABLET | Freq: Four times a day (QID) | ORAL | Status: DC | PRN
  Administered 2024-02-23: 650 mg via ORAL
  Filled 2024-02-22: qty 2

## 2024-02-22 MED ORDER — ONDANSETRON HCL 4 MG PO TABS: 4.0000 mg | ORAL_TABLET | Freq: Four times a day (QID) | ORAL | Status: DC | PRN

## 2024-02-22 MED ORDER — SODIUM CHLORIDE (PF) 0.9 % IJ SOLN
INTRAMUSCULAR | Status: AC
  Filled 2024-02-22: qty 50

## 2024-02-22 MED ORDER — IOHEXOL 300 MG/ML  SOLN
80.0000 mL | Freq: Once | INTRAMUSCULAR | Status: AC | PRN
  Administered 2024-02-22: 80 mL via INTRAVENOUS

## 2024-02-22 MED ORDER — SODIUM CHLORIDE 0.9 % IV SOLN
1.0000 g | INTRAVENOUS | Status: DC
  Administered 2024-02-23 – 2024-02-24 (×2): 1 g via INTRAVENOUS
  Filled 2024-02-22 (×2): qty 10

## 2024-02-22 MED ORDER — SODIUM CHLORIDE 0.9 % IV SOLN
500.0000 mg | INTRAVENOUS | Status: DC
  Administered 2024-02-23 – 2024-02-24 (×2): 500 mg via INTRAVENOUS
  Filled 2024-02-22 (×2): qty 5

## 2024-02-22 NOTE — ED Triage Notes (Addendum)
 Per EMS, pt, from home, presents after an unwitnessed fall down an unknown number of steps.  Pt c/o upper back pain.  Pain score 8/10.  Pt reports he sleeps downstairs and doesn't know why he was upstairs.  Pt doesn't remember the fall.  No blood thinners.   Lac noted to L ear and R hand.   C collar in place.

## 2024-02-22 NOTE — ED Provider Notes (Signed)
 [facility] AT [facility] Provider Note   CSN: [id number] Arrival date & time: 02/22/24  9282     Patient presents with: Fall and Back Pain   Bobby Nguyen is a 78 y.o. male with a past medical history significant for history of CVA, renal cell carcinoma of left kidney, tobacco use, chronic pain on chronic opioids, hypertension, CKD, and COPD who presents to the ED after an unwitnessed fall.  Wife and daughter at bedside provided history.  Wife notes she found patient this morning at the bottom of the stairs.  Wife notes patient sleeps downstairs and does not believe he fell down the stairs.  Has chronic thoracic back pain and on chronic opioids.  Daughter at bedside notes that patient was diagnosed with a UTI on Friday however, did not start antibiotics until 2 days ago.  Patient currently taking Macrobid.  Denies fever and chills.  Daughter at bedside notes that patient appears slightly confused.  Was in his normal state of health yesterday.  Patient not currently on any blood thinners.  Sustained a laceration to left ear and right hand.  Unsure when his last tetanus shot was. Patient admits to some upper right chest wall pain.   History obtained from patient, daughter, wife and past medical records. No interpreter used during encounter.      Prior to Admission medications   Medication Sig Start Date End Date Taking? Authorizing Provider  allopurinol  (ZYLOPRIM ) 300 MG tablet Take 300 mg by mouth in the morning. 08/06/15   [provider]  amLODipine  (NORVASC ) 10 MG tablet Take 10 mg by mouth in the morning. 04/19/17   [provider]  atenolol  (TENORMIN ) 50 MG tablet Take 50 mg by mouth in the morning. 07/22/15   [provider]  bisacodyl  (DULCOLAX) 5 MG EC tablet Take 10 mg by mouth in the morning.    [provider]  ciclopirox  (PENLAC ) 8 % solution Apply topically 2 (two) times daily as needed. Apply over nail and  surrounding skin. Apply daily over previous coat. After seven (7) days, may remove with alcohol and continue cycle.    [provider]  escitalopram  (LEXAPRO ) 10 MG tablet Take 10 mg by mouth daily. 04/19/17   [provider]  fenofibrate  160 MG tablet Take 160 mg by mouth in the morning. 09/07/15   [provider]  oxybutynin  (DITROPAN ) 5 MG tablet Take 1 tablet (5 mg total) by mouth every 8 (eight) hours as needed for bladder spasms. 12/28/23   Devere Lonni Righter, MD  oxyCODONE -acetaminophen  (PERCOCET) 10-325 MG tablet Take 1 tablet by mouth 4 (four) times daily as needed for pain. 11/01/23   [provider]  phenazopyridine  (PYRIDIUM ) 200 MG tablet Take 1 tablet (200 mg total) by mouth 3 (three) times daily as needed (for pain with urination). 12/28/23 12/27/24  Devere Lonni Righter, MD    Allergies: Atorvastatin, Pravastatin sodium, Solifenacin, and Penicillins    Review of Systems  Constitutional:  Negative for fever.  Cardiovascular:  Positive for chest pain (chest wall pain).  Gastrointestinal:  Negative for abdominal pain.  Musculoskeletal:  Positive for back pain (chronic).    Updated Vital Signs BP (!) 158/70 (BP Location: Left Arm)   Pulse 63   Temp 98.5 F (36.9 C) (Oral)   Resp 20   Ht 5' 6 (1.676 m)   Wt 73.5 kg   SpO2 92%   BMI 26.15 kg/m   Physical Exam Vitals and nursing  note reviewed.  Constitutional:      General: He is not in acute distress.    Appearance: He is not ill-appearing.  HENT:     Head: Normocephalic.  Eyes:     Pupils: Pupils are equal, round, and reactive to light.  Neck:     Comments: C collar in place Cardiovascular:     Rate and Rhythm: Normal rate and regular rhythm.     Pulses: Normal pulses.     Heart sounds: Normal heart sounds. No murmur heard.    No friction rub. No gallop.  Pulmonary:     Effort: Pulmonary effort is normal.     Breath sounds: Normal breath sounds.  Chest:        Comments: Tenderness to right side of anterior chest wall without crepitus or deformity Abdominal:     General: Abdomen is flat. There is no distension.     Palpations: Abdomen is soft.     Tenderness: There is no abdominal tenderness. There is no guarding or rebound.  Musculoskeletal:        General: Normal range of motion.     Cervical back: Neck supple.  Skin:    General: Skin is warm and dry.     Comments: Small skin tear to right hand.  Dried blood to left ear.  Neurological:     General: No focal deficit present.     Mental Status: He is alert.     Comments: Speech is clear, able to follow commands CN III-XII intact Normal strength in upper and lower extremities bilaterally including dorsiflexion and plantar flexion, strong and equal grip strength Sensation grossly intact throughout Moves extremities without ataxia, coordination intact No pronator drift    Psychiatric:        Mood and Affect: Mood normal.        Behavior: Behavior normal.     (all labs ordered are listed, but only abnormal results are displayed) Labs Reviewed  COMPREHENSIVE METABOLIC PANEL WITH GFR - Abnormal; Notable for the following components:      Result Value   Glucose, Bld 101 (*)    Creatinine, Ser 1.46 (*)    GFR, Estimated 49 (*)    All other components within normal limits  CBC WITH DIFFERENTIAL/PLATELET - Abnormal; Notable for the following components:   WBC 12.6 (*)    Neutro Abs 9.5 (*)    All other components within normal limits  URINALYSIS, W/ REFLEX TO CULTURE (INFECTION SUSPECTED) - Abnormal; Notable for the following components:   Color, Urine AMBER (*)    Hgb urine dipstick SMALL (*)    Protein, ur 30 (*)    Nitrite POSITIVE (*)    Leukocytes,Ua MODERATE (*)    Bacteria, UA RARE (*)    All other components within normal limits  CULTURE, BLOOD (ROUTINE X 2)  CULTURE, BLOOD (ROUTINE X 2)  URINE CULTURE  PROTIME-INR  I-STAT CG4 LACTIC ACID, ED  TROPONIN T, HIGH SENSITIVITY   TROPONIN T, HIGH SENSITIVITY    EKG: EKG Interpretation Date/Time:  Thursday February 22 2024 09:08:31 EST Ventricular Rate:  70 PR Interval:  172 QRS Duration:  111 QT Interval:  452 QTC Calculation: 488 R Axis:   -20  Text Interpretation: Sinus rhythm Multiple ventricular premature complexes Consider left atrial enlargement Borderline left axis deviation Borderline prolonged QT interval Baseline wander in lead(s) V2 Compared with prior EKG from 12/22/2023 Confirmed by Gennaro Bouchard ([id number]) on 02/22/2024 9:11:45 AM  Radiology: CT T-SPINE NO CHARGE  Result Date: 02/22/2024 EXAM: CT THORACIC SPINE WITHOUT CONTRAST 02/22/2024 11:34:35 AM TECHNIQUE: CT of the thoracic spine was performed without the administration of intravenous contrast. Multiplanar reformatted images are provided for review. Automated exposure control, iterative reconstruction, and/or weight based adjustment of the mA/kV was utilized to reduce the radiation dose to as low as reasonably achievable. COMPARISON: None available. CLINICAL HISTORY: FINDINGS: BONES AND ALIGNMENT: There is a chronic compression deformity of T6 which has been treated with bilateral vertebral augmentation. There are chronic nonunited fracture deformities of the spinous processes of T1, T2 and T4. No acute fracture or suspicious bone lesion. Normal alignment. DEGENERATIVE CHANGES: There are anterior and lateral osteophytes present throughout the thoracic spine. SOFT TISSUES: No acute abnormality. IMPRESSION: 1. Chronic compression deformity of T6 treated with bilateral vertebral augmentation. 2. Chronic nonunited fracture deformities of the spinous processes of T1, T2, and T4. 3. No acute fracture. Electronically signed by: Evalene Coho MD 02/22/2024 12:07 PM EST RP Workstation: [id number]   CT L-SPINE NO CHARGE Result Date: 02/22/2024 EXAM: CT OF THE LUMBAR SPINE WITHOUT CONTRAST 02/22/2024 11:34:35 AM TECHNIQUE: CT of the lumbar spine was performed  without the administration of intravenous contrast. Multiplanar reformatted images are provided for review. Automated exposure control, iterative reconstruction, and/or weight based adjustment of the mA/kV was utilized to reduce the radiation dose to as low as reasonably achievable. COMPARISON: None available. CLINICAL HISTORY: FINDINGS: BONES AND ALIGNMENT: Normal vertebral body heights. There is no evidence of fracture. There is a nonunited deformity of the left transverse process of L1. No suspicious bone lesion. Normal alignment. DEGENERATIVE CHANGES: There is moderate chronic degenerative disc disease at L5-S1. SOFT TISSUES: The patient is status post left nephrectomy. There is extensive atheromatous disease within the abdominal aorta. IMPRESSION: 1. No acute findings. 2. Moderate chronic degenerative disc disease at L5-S1. 3. Status post left nephrectomy. 4. Extensive atheromatous disease of the abdominal aorta. Electronically signed by: Evalene Coho MD 02/22/2024 12:04 PM EST RP Workstation: [id number]   CT HEAD WO CONTRAST Result Date: 02/22/2024 EXAM: CT HEAD AND CERVICAL SPINE 02/22/2024 11:34:35 AM TECHNIQUE: CT of the head and cervical spine was performed without the administration of intravenous contrast. Multiplanar reformatted images are provided for review. Automated exposure control, iterative reconstruction, and/or weight based adjustment of the mA/kV was utilized to reduce the radiation dose to as low as reasonably achievable. COMPARISON: CT Head and CT cervical spine June 19, 2007 CLINICAL HISTORY: Head trauma, moderate-severe FINDINGS: CT HEAD BRAIN AND VENTRICLES: No acute intracranial hemorrhage. No mass effect or midline shift. No abnormal extra-axial fluid collection. No evidence of acute infarct. Remote right MCA territory infarct, new since 2009. No hydrocephalus. ORBITS: No acute abnormality. SINUSES AND MASTOIDS: No acute abnormality. SOFT TISSUES AND SKULL: No acute skull  fracture. No acute soft tissue abnormality. CT CERVICAL SPINE BONES AND ALIGNMENT: No acute fracture or traumatic malalignment. Subtle sequelae of remote lower cervical and upper thoracic fractures. DEGENERATIVE CHANGES: Mild for age bony degenerative change. SOFT TISSUES: No prevertebral soft tissue swelling. IMPRESSION: 1. No acute intracranial abnormality. 2. No acute fracture or traumatic malalignment of the cervical spine. 3. Remote right MCA territory infarct, new since 2009. Electronically signed by: Gilmore Molt MD 02/22/2024 11:49 AM EST RP Workstation: [id number]   CT CERVICAL SPINE WO CONTRAST Result Date: 02/22/2024 EXAM: CT HEAD AND CERVICAL SPINE 02/22/2024 11:34:35 AM TECHNIQUE: CT of the head and cervical spine was performed without the administration of intravenous contrast. Multiplanar reformatted images are provided for review. Automated  exposure control, iterative reconstruction, and/or weight based adjustment of the mA/kV was utilized to reduce the radiation dose to as low as reasonably achievable. COMPARISON: CT Head and CT cervical spine June 19, 2007 CLINICAL HISTORY: Head trauma, moderate-severe FINDINGS: CT HEAD BRAIN AND VENTRICLES: No acute intracranial hemorrhage. No mass effect or midline shift. No abnormal extra-axial fluid collection. No evidence of acute infarct. Remote right MCA territory infarct, new since 2009. No hydrocephalus. ORBITS: No acute abnormality. SINUSES AND MASTOIDS: No acute abnormality. SOFT TISSUES AND SKULL: No acute skull fracture. No acute soft tissue abnormality. CT CERVICAL SPINE BONES AND ALIGNMENT: No acute fracture or traumatic malalignment. Subtle sequelae of remote lower cervical and upper thoracic fractures. DEGENERATIVE CHANGES: Mild for age bony degenerative change. SOFT TISSUES: No prevertebral soft tissue swelling. IMPRESSION: 1. No acute intracranial abnormality. 2. No acute fracture or traumatic malalignment of the cervical spine. 3. Remote  right MCA territory infarct, new since 2009. Electronically signed by: Gilmore Molt MD 02/22/2024 11:49 AM EST RP Workstation: [id number]   CT CHEST ABDOMEN PELVIS W CONTRAST Result Date: 02/22/2024 EXAM: CT CHEST, ABDOMEN AND PELVIS WITH CONTRAST 02/22/2024 11:34:35 AM TECHNIQUE: CT of the chest, abdomen and pelvis was performed with the administration of 80 mL of iohexol  (OMNIPAQUE ) 300 MG/ML solution. Multiplanar reformatted images are provided for review. Automated exposure control, iterative reconstruction, and/or weight based adjustment of the mA/kV was utilized to reduce the radiation dose to as low as reasonably achievable. COMPARISON: CT of the chest dated 07/24/2023 and CT of the abdomen dated 06/13/2022. CLINICAL HISTORY: Polytrauma, blunt. FINDINGS: CHEST: MEDIASTINUM AND LYMPH NODES: The heart is mildly enlarged. Pericardium is unremarkable. The central airways are clear. No mediastinal, hilar or axillary lymphadenopathy. LUNGS AND PLEURA: Mild-to-moderate central lobular and paraseptal emphysema. There are streaky peribronchovascular opacities present within the lower lobes bilaterally. There is also mild bronchiectasis. No focal consolidation or pulmonary edema. No pleural effusion or pneumothorax. ABDOMEN AND PELVIS: LIVER: There is a vascular blush present within the periphery of segment 7 of the liver. There is also a small cyst within segment 6. GALLBLADDER AND BILE DUCTS: Gallbladder is unremarkable. No biliary ductal dilatation. SPLEEN: No acute abnormality. PANCREAS: No acute abnormality. ADRENAL GLANDS: No acute abnormality. KIDNEYS, URETERS AND BLADDER: There is a hemorrhagic cyst again demonstrated in the superior pole of the right kidney. The left kidney is surgically absent. No stones in the right kidney or ureter. No hydronephrosis. No perinephric or periureteral stranding. Urinary bladder is unremarkable. GI AND BOWEL: Stomach demonstrates no acute abnormality. There is no bowel  obstruction. REPRODUCTIVE ORGANS: The patient is status post prostatectomy. PERITONEUM AND RETROPERITONEUM: No ascites. No free air. VASCULATURE: Aorta is normal in caliber. There is extensive calcific atheromatous disease throughout the thoracic aorta. ABDOMINAL AND PELVIS LYMPH NODES: No lymphadenopathy. BONES AND SOFT TISSUES: The bones appear intact. No acute osseous abnormality. No focal soft tissue abnormality. There is no evidence of acute traumatic injury. IMPRESSION: 1. No evidence of acute traumatic injury. 2. Mild-to-moderate centrilobular and paraseptal emphysema. Given emphysema is an independent lung cancer risk factor, consider evaluation for eligibility for a low-dose CT lung cancer screening program. 3. Streaky peribronchovascular opacities in the lower lobes bilaterally with mild bronchiectasis. 4. Mild cardiomegaly. 5. Extensive calcific atheromatous disease throughout the thoracic aorta. 6. Vascular blush in the periphery of segment 7 of the liver and a small cyst in segment 6. 7. Hemorrhagic cyst in the superior pole of the right kidney. Left kidney is surgically absent. Electronically signed  by: Evalene Coho MD 02/22/2024 11:47 AM EST RP Workstation: [id number]   DG Pelvis Portable Result Date: 02/22/2024 CLINICAL DATA:  Fall down stairs.  Pelvic pain. EXAM: PORTABLE PELVIS 1-2 VIEWS COMPARISON:  None Available. FINDINGS: There is no evidence of pelvic fracture or diastasis. No pelvic bone lesions are seen. Bilateral iliac and peripheral vascular calcification noted. IMPRESSION: No acute findings. Electronically Signed   By: Norleen DELENA Kil M.D.   On: 02/22/2024 09:19   DG Chest Port 1 View Result Date: 02/22/2024 CLINICAL DATA:  Fall down stairs. EXAM: PORTABLE CHEST 1 VIEW COMPARISON:  03/04/2022 FINDINGS: The heart size and mediastinal contours are within normal limits. Cardiac loop recorder again seen. Decreased lung volumes are seen with mild bibasilar atelectasis. No evidence of  pulmonary consolidation or pleural effusion. No evidence of pneumothorax or hemothorax. Both lungs are clear. The visualized skeletal structures are unremarkable. IMPRESSION: Low lung volumes with mild bibasilar atelectasis. Electronically Signed   By: Norleen DELENA Kil M.D.   On: 02/22/2024 09:18     Procedures   Medications Ordered in the ED  cefTRIAXone  (ROCEPHIN ) 1 g in sodium chloride  0.9 % 100 mL IVPB (has no administration in time range)  azithromycin  (ZITHROMAX ) 500 mg in sodium chloride  0.9 % 250 mL IVPB (has no administration in time range)  lactated ringers  bolus 1,000 mL (0 mLs Intravenous Stopped 02/22/24 1215)  Tdap (ADACEL ) injection 0.5 mL (0.5 mLs Intramuscular Given 02/22/24 0911)  morphine  (PF) 4 MG/ML injection 4 mg (4 mg Intravenous Given 02/22/24 0914)  iohexol  (OMNIPAQUE ) 300 MG/ML solution 80 mL (80 mLs Intravenous Contrast Given 02/22/24 1041)    Clinical Course as of 02/22/24 1303  Thu Feb 22, 2024  0942 WBC(!): 12.6 [CA]  1000 Creatinine(!): 1.46 [CA]  1003 Reassessed patient at bedside.  Resting comfortably.  Called CT to expedite CT scans.  Will take patient next. [CA]  [address]): MODERATE [CA]  1129 Nitrite(!): POSITIVE [CA]  1129 Bacteria, UA(!): RARE [CA]    Clinical Course User Index [CA] Lorelle Aleck BROCKS, PA-C                                 Medical Decision Making Amount and/or Complexity of Data Reviewed Independent Historian: caregiver and spouse    Details: Daughter and wife at bedside provided history External Data Reviewed: notes. Labs: ordered. Decision-making details documented in ED Course. Radiology: ordered and independent interpretation performed. Decision-making details documented in ED Course. ECG/medicine tests: ordered and independent interpretation performed. Decision-making details documented in ED Course.  Risk Prescription drug management. Decision regarding hospitalization.   This patient presents to the ED  for concern of fall, this involves an extensive number of treatment options, and is a complaint that carries with it a high risk of complications and morbidity.  The differential diagnosis includes bony fracture, intracranial bleed, sepsis, electrolyte abnormality, etc  78 year-old male presents to the ED after an unwitnessed fall.  Patient found at the bottom of the stairs however, sleeps downstairs.  Unclear whether or not patient fell downstairs or at the bottom of the stairs.  Patient currently being treated for a UTI and on Macrobid with slight delay in therapy (diagnosed on Friday, started abx on Tuesday). No fever or chills.  Upon arrival patient afebrile, not tachycardic or hypoxic.  Patient has some right anterior chest wall tenderness without crepitus or deformity.  C-collar in place.  Does have some thoracic midline  tenderness.  Able to move all 4 extremities.  No neurological deficits.  Family at bedside concerned that patient is slightly confused.  Patient able to answer full name, current location, and year.  Patient does appear confused about fall and it is unclear whether or not patient fell down stairs or fell prior to going up the stairs.  Trauma scans ordered.  Sepsis labs given current UTI.  IV fluids started.  Tetanus updated.  CBC with slight leukocytosis at 12.6.  Normal hemoglobin.  CMP with elevated creatinine 1.4. IVFs already given. No major electrolyte derangements.  Lactic acid normal.  UA positive for nitrites and moderate leukocytes consistent with acute cystitis.  Patient currently on Macrobid for the past 2 days.  Given some confusion will treat with IV Rocephin .  CT imaging personally reviewed and interpreted which are negative for any acute traumatic injuries.  Does demonstrate bilateral lung opacities.  Family notes patient has a chronic cough.  Will cover for possible pneumonia with azithromycin .  Given confusion and known UTI will discuss with hospitalist for admission for  IV antibiotics.  Family agreeable to plan.  Discussed with Dr. Gennaro who evaluated patient at bedside and agrees with assessment and plan.  Co morbidities that complicate the patient evaluation  Hx CVA Cardiac Monitoring: / EKG:  The patient was maintained on a cardiac monitor.  I personally viewed and interpreted the cardiac monitored which showed an underlying rhythm of: NSR  Social Determinants of Health:  Elderly >65  Test / Admission - Considered:  Admit for AMS secondary to UTI   Discussed with Dr. Celinda with [facility] who agrees with assessment and plan    Final diagnoses:  Acute cystitis with hematuria    ED Discharge Orders     None          Lorelle Aleck BROCKS, PA-C 02/22/24 1303    Gennaro Duwaine CROME, DO 02/23/24 [phone]

## 2024-02-22 NOTE — H&P (Signed)
 History and Physical    Patient: Bobby Nguyen FMW:[id number] DOB: [birth year] DOA: 02/22/2024 DOS: the patient was seen and examined on 02/22/2024 PCP: Stephanie Charlene CROME, MD  Patient coming from: Home  Chief Complaint:  Chief Complaint  Patient presents with   Fall   Back Pain   HPI: Bobby Nguyen is a 78 y.o. male with medical history significant of aortic atherosclerosis, osteoarthritis, back pain, BPH, bladder cancer, CKD, chronic pain syndrome, emphysema/COPD, depression, gout, hyperlipidemia, hypertension, history of pneumonia, renal cancer, left nephrectomy, history of tobacco use, history of ischemic CVA who was brought to the emergency department after having a fall at home and being found with altered mental status by his wife who provides that portion of the HPI as the patient does not have good recollection of the events of this morning.  He normally is sleeps downstairs, but does not remember entirely how he went upstairs and fell. He was recently treated for UTI with nitrofurantoin.  No flank pain, dysuria, frequency or hematuria.  Also denied fever, chills, rhinorrhea, sore throat, wheezing or hemoptysis.  No chest pain, palpitations, diaphoresis, PND, orthopnea or pitting edema of the lower extremities.  No abdominal pain, nausea, emesis, diarrhea, constipation, melena or hematochezia.   No polyuria, polydipsia, polyphagia or blurred vision.   Lab work: Urinalysis small hemoglobin, protein 30 mg/dL, positive nitrites, moderate leukocyte esterase, greater than 50 WBC and rare bacteria on microscopic examination.  CBC showed a white count of 12.6, hemoglobin 14.6 g/dL and platelets 744.  Normal PT and INR.  Normal lactic acid and troponin level.  CMP was unremarkable with the exception of a glucose of 101 and creatinine of 1.46 mg/dL.  Renal function is around baseline.  Imaging: Portable 1 view chest radiograph showing low lung volumes with mild bibasilar atelectasis.  Pelvics x-ray  with no acute findings.  CT head without contrast no acute intracranial abnormality.  There is a remote right MCA territory infarct.  CT cervical spine with no acute fracture or traumatic malalignment.  CT chest/abdomen/pelvis with contrast showing mild cardiomegaly.  Mild to moderate centrilobular and paraseptal emphysema.  Streaky peribronchovascular opacities in the lower lobes bilaterally.  Mild bronchiectasis.  No edema or focal consolidation.  No effusion or pneumothorax.  Thoracic aorta atheromatous calcification.  Hepatic cysts.  Hemorrhagic cyst in the superior pole of the right kidney.  Surgically absent left kidney.  Thoracic spine showed chronic compression deformity of T6 treated with bilateral vertebral augmentation.  Chronic nonunited fracture deformities of the spinous processes of T1, T2 and T4.  No acute fracture.  CT lumbar spine with no acute findings.  There is moderate chronic DDD L5-S1.  Extensive atheromatous disease of abdominal aorta.  ED course: Initial vital signs were temperature 98.5 F, pulse 70, respiration 18, BP 174/64 mmHg and O2 sat 93% on room air.  The patient received LR 1000 mL liter bolus, morphine  4 mg IVP, Tdap 0.5 mL IM x 1, ceftriaxone  1 g IVPB and azithromycin  500 mg IVPB.   Review of Systems: As mentioned in the history of present illness. All other systems reviewed and are negative. Past Medical History:  Diagnosis Date   Aortic atherosclerosis    Arthritis    Back pain    Bladder cancer (HCC) 12/2023   Cancer (HCC)    skin cancer,right ear.   Chronic kidney disease    COPD (chronic obstructive pulmonary disease) (HCC)    Depression    Gout    Hyperlipidemia  Hypertension    Pneumonia    Renal cancer (HCC) 05/2022   Stroke [facility])    Ischemic MCA CVA   Past Surgical History:  Procedure Laterality Date   arm surgery Left    fracture surgery.Plate and screws in.   BACK SURGERY  2010   Thoracic fusion, kyphoplasty   CATARACT EXTRACTION W/  INTRAOCULAR LENS IMPLANT Bilateral    CYSTOSCOPY WITH STENT PLACEMENT Right 12/27/2023   Procedure: CYSTOSCOPY, WITH STENT INSERTION, RIGHT RETROGRADE;  Surgeon: Devere Lonni Righter, MD;  Location: [facility] ORS;  Service: Urology;  Laterality: Right;   EP IMPLANTABLE DEVICE N/A 10/06/2015   Procedure: Loop Recorder Insertion;  Surgeon: Lynwood Rakers, MD;  Location: [facility] INVASIVE CV LAB;  Service: Cardiovascular;  Laterality: N/A;   FRACTURE SURGERY Left    ORIF wrist,   ROBOT ASSISTED LAPAROSCOPIC NEPHRECTOMY Left 07/08/2022   Procedure: XI ROBOTIC ASSISTED LEFT LAPAROSCOPIC NEPHRECTOMY;  Surgeon: Devere Lonni Righter, MD;  Location: [facility];  Service: Urology;  Laterality: Left;   TEE WITHOUT CARDIOVERSION N/A 10/08/2015   Procedure: TRANSESOPHAGEAL ECHOCARDIOGRAM (TEE);  Surgeon: Vina LULLA Gull, MD;  Location: [facility] ENDOSCOPY;  Service: Cardiovascular;  Laterality: N/A;   TRANSURETHRAL RESECTION OF BLADDER TUMOR N/A 12/27/2023   Procedure: TURBT (TRANSURETHRAL RESECTION OF BLADDER TUMOR);  Surgeon: Devere Lonni Righter, MD;  Location: [facility] ORS;  Service: Urology;  Laterality: N/A;   TRANSURETHRAL RESECTION OF PROSTATE N/A 12/27/2023   Procedure: TURP (TRANSURETHRAL RESECTION OF PROSTATE);  Surgeon: Devere Lonni Righter, MD;  Location: [facility];  Service: Urology;  Laterality: N/A;  TURP (TRANSURETHRAL RESECTION OF PROSTATE), TURBT (TRANSURETHRAL RESECTION OF BLADDER TUMOR), POSSIBLECYSTOSCOPY WITH RIGHT STENT INSERTION   Social History:  reports that he has been smoking cigarettes. He has never used smokeless tobacco. He reports current alcohol use of about 4.0 standard drinks of alcohol per week. He reports that he does not use drugs.  Allergies  Allergen Reactions   Atorvastatin Other (See Comments)    constipation   Pravastatin Sodium Other (See Comments)    sweating   Solifenacin Other (See Comments)    sweating   Penicillins Itching, Swelling and Rash    Peripheral swelling     History reviewed. No pertinent family history.  Prior to Admission medications   Medication Sig Start Date End Date Taking? Authorizing Provider  allopurinol  (ZYLOPRIM ) 300 MG tablet Take 300 mg by mouth in the morning. 08/06/15   [provider]  amLODipine  (NORVASC ) 10 MG tablet Take 10 mg by mouth in the morning. 04/19/17   [provider]  atenolol  (TENORMIN ) 50 MG tablet Take 50 mg by mouth in the morning. 07/22/15   [provider]  bisacodyl  (DULCOLAX) 5 MG EC tablet Take 10 mg by mouth in the morning.    [provider]  ciclopirox  (PENLAC ) 8 % solution Apply topically 2 (two) times daily as needed. Apply over nail and surrounding skin. Apply daily over previous coat. After seven (7) days, may remove with alcohol and continue cycle.    [provider]  escitalopram  (LEXAPRO ) 10 MG tablet Take 10 mg by mouth daily. 04/19/17   [provider]  fenofibrate  160 MG tablet Take 160 mg by mouth in the morning. 09/07/15   [provider]  oxybutynin  (DITROPAN ) 5 MG tablet Take 1 tablet (5 mg total) by mouth every 8 (eight) hours as needed for bladder spasms. 12/28/23   Devere Lonni Righter, MD  oxyCODONE -acetaminophen  (PERCOCET) 10-325 MG tablet Take 1 tablet by mouth 4 (  four) times daily as needed for pain. 11/01/23   [provider]  phenazopyridine  (PYRIDIUM ) 200 MG tablet Take 1 tablet (200 mg total) by mouth 3 (three) times daily as needed (for pain with urination). 12/28/23 12/27/24  Devere Lonni Righter, MD    Physical Exam: Vitals:   02/22/24 0900 02/22/24 0923 02/22/24 1000 02/22/24 1218  BP: (!) 171/59  (!) 155/84 (!) 158/70  Pulse: 73  65 63  Resp: 14  20 20   Temp:  98.7 F (37.1 C)  98.5 F (36.9 C)  TempSrc:    Oral  SpO2: 91%  90% 92%  Weight:      Height:       Physical Exam Vitals and nursing note reviewed.  Constitutional:      Appearance: Normal appearance. He is ill-appearing.  HENT:      Head: Normocephalic.     Nose: No rhinorrhea.     Mouth/Throat:     Mouth: Mucous membranes are dry.  Eyes:     General: No scleral icterus.    Pupils: Pupils are equal, round, and reactive to light.  Cardiovascular:     Rate and Rhythm: Normal rate.     Pulses: Normal pulses.     Heart sounds: Normal heart sounds.  Pulmonary:     Effort: Pulmonary effort is normal.     Breath sounds: Normal breath sounds. No rhonchi or rales.  Abdominal:     General: Bowel sounds are normal. There is no distension.     Palpations: Abdomen is soft.     Tenderness: There is no abdominal tenderness. There is no right CVA tenderness or left CVA tenderness.  Musculoskeletal:     Cervical back: Neck supple.     Right lower leg: No edema.     Left lower leg: No edema.  Skin:    General: Skin is warm and dry.  Neurological:     General: No focal deficit present.     Mental Status: He is alert and oriented to person, place, and time.  Psychiatric:        Mood and Affect: Mood normal.        Behavior: Behavior normal.     Data Reviewed:  Results are pending, will review when available.  EKG: Vent. rate 70 BPM  PR interval 172 ms  QRS duration 111 ms  QT/QTcB 452/488 ms  P-R-T axes 76 -20 72  Sinus rhythm  Multiple ventricular premature complexes  Consider left atrial enlargement  Borderline left axis deviation  Borderline prolonged QT interval  Baseline wander in lead(s) V2   Assessment and Plan: Principal Problem:   Acute metabolic encephalopathy In the setting of:   Acute cystitis Mental status improved. Admit to PCU/inpatient. Reorient as needed. Analgesics as needed. Continue ceftriaxone  1 g IVPB daily. Follow-up urine culture and sensitivity. Follow-up blood culture and sensitivity. Follow-up CBC and chemistry in the morning.  Active Problems:   Chronic pain syndrome   Thoracic vertebral fracture (HCC) Continue oxycodone  10 mg p.o. twice daily.    Bilateral  atelectasis   Emphysema lung (HCC)  As needed supplemental oxygen. As needed bronchodilators. Continue ceftriaxone  as above. Continue azithromycin  500 mg IVPB daily. Check strep pneumoniae urinary antigen. Check sputum Gram stain, culture and sensitivity. Advised to follow-up with PCP for low-dose radiation screening CT.    Contusion of auricle of left ear And:   Abrasion of left ear Analgesics as needed. Continue local care.    Contusion of right elbow  And:   Abrasion of right elbow Received Tdap booster. Continue local care.    CKD stage 3a, GFR 45-59 ml/min (HCC) Has single kidney after left nephrectomy. Monitor renal function and electrolytes.    Aortic atherosclerosis Follow-up with PCP. Would benefit from statin therapy.    Gout Continue allopurinol  300 mg p.o. daily.    Advance Care Planning:   Code Status: Full Code   Consults:   Family Communication: His daughter and spouse were at bedside.  Severity of Illness: The appropriate patient status for this patient is INPATIENT. Inpatient status is judged to be reasonable and necessary in order to provide the required intensity of service to ensure the patient's safety. The patient's presenting symptoms, physical exam findings, and initial radiographic and laboratory data in the context of their chronic comorbidities is felt to place them at high risk for further clinical deterioration. Furthermore, it is not anticipated that the patient will be medically stable for discharge from the hospital within 2 midnights of admission.   * I certify that at the point of admission it is my clinical judgment that the patient will require inpatient hospital care spanning beyond 2 midnights from the point of admission due to high intensity of service, high risk for further deterioration and high frequency of surveillance required.*  Author: Alm Dorn Castor, MD 02/22/2024 12:59 PM  For on call review [email or web address].   This  document was prepared using Dragon voice recognition software and may contain some unintended transcription errors.

## 2024-02-23 ENCOUNTER — Other Ambulatory Visit: Payer: Self-pay | Admitting: Student

## 2024-02-23 ENCOUNTER — Inpatient Hospital Stay (HOSPITAL_COMMUNITY)

## 2024-02-23 DIAGNOSIS — S00432D Contusion of left ear, subsequent encounter: Secondary | ICD-10-CM

## 2024-02-23 DIAGNOSIS — N3000 Acute cystitis without hematuria: Secondary | ICD-10-CM

## 2024-02-23 DIAGNOSIS — R001 Bradycardia, unspecified: Secondary | ICD-10-CM

## 2024-02-23 DIAGNOSIS — I10 Essential (primary) hypertension: Secondary | ICD-10-CM | POA: Diagnosis not present

## 2024-02-23 DIAGNOSIS — N1831 Chronic kidney disease, stage 3a: Secondary | ICD-10-CM | POA: Diagnosis not present

## 2024-02-23 DIAGNOSIS — R55 Syncope and collapse: Secondary | ICD-10-CM

## 2024-02-23 DIAGNOSIS — J439 Emphysema, unspecified: Secondary | ICD-10-CM | POA: Diagnosis not present

## 2024-02-23 DIAGNOSIS — G9341 Metabolic encephalopathy: Secondary | ICD-10-CM | POA: Diagnosis not present

## 2024-02-23 LAB — ECHOCARDIOGRAM COMPLETE
AR max vel: 2.27 cm2
AV Area VTI: 2.46 cm2
AV Area mean vel: 2.42 cm2
AV Mean grad: 5 mmHg
AV Peak grad: 9.6 mmHg
Ao pk vel: 1.55 m/s
Area-P 1/2: 2.36 cm2
Calc EF: 60.7 %
Height: 66 in
MV VTI: 2.93 cm2
P 1/2 time: 744 ms
S' Lateral: 2.5 cm
Single Plane A2C EF: 58.9 %
Single Plane A4C EF: 63.6 %
Weight: 2592 [oz_av]

## 2024-02-23 LAB — URINE CULTURE: Culture: NO GROWTH

## 2024-02-23 LAB — COMPREHENSIVE METABOLIC PANEL WITH GFR
ALT: <5 U/L (ref 0–44)
AST: 23 U/L (ref 15–41)
Albumin: 3.7 g/dL (ref 3.5–5.0)
Alkaline Phosphatase: 58 U/L (ref 38–126)
Anion gap: 11 (ref 5–15)
BUN: 19 mg/dL (ref 8–23)
CO2: 24 mmol/L (ref 22–32)
Calcium: 9.3 mg/dL (ref 8.9–10.3)
Chloride: 105 mmol/L (ref 98–111)
Creatinine, Ser: 1.33 mg/dL — ABNORMAL HIGH (ref 0.61–1.24)
GFR, Estimated: 55 mL/min — ABNORMAL LOW (ref >60)
Glucose, Bld: 95 mg/dL (ref 70–99)
Potassium: 3.6 mmol/L (ref 3.5–5.1)
Sodium: 140 mmol/L (ref 135–145)
Total Bilirubin: 0.4 mg/dL (ref 0.0–1.2)
Total Protein: 6.6 g/dL (ref 6.5–8.1)

## 2024-02-23 LAB — CBC
HCT: 38.6 % — ABNORMAL LOW (ref 39.0–52.0)
Hemoglobin: 12.5 g/dL — ABNORMAL LOW (ref 13.0–17.0)
MCH: 30.9 pg (ref 26.0–34.0)
MCHC: 32.4 g/dL (ref 30.0–36.0)
MCV: 95.5 fL (ref 80.0–100.0)
Platelets: 217 K/uL (ref 150–400)
RBC: 4.04 MIL/uL — ABNORMAL LOW (ref 4.22–5.81)
RDW: 15 % (ref 11.5–15.5)
WBC: 11.5 K/uL — ABNORMAL HIGH (ref 4.0–10.5)
nRBC: 0 % (ref 0.0–0.2)

## 2024-02-23 LAB — MAGNESIUM: Magnesium: 2 mg/dL (ref 1.7–2.4)

## 2024-02-23 MED ORDER — HYDRALAZINE HCL 20 MG/ML IJ SOLN
5.0000 mg | Freq: Once | INTRAMUSCULAR | Status: AC
  Administered 2024-02-23: 5 mg via INTRAVENOUS
  Filled 2024-02-23: qty 1

## 2024-02-23 MED ORDER — AMLODIPINE BESYLATE 10 MG PO TABS
10.0000 mg | ORAL_TABLET | Freq: Every day | ORAL | Status: DC
  Administered 2024-02-23 – 2024-02-24 (×2): 10 mg via ORAL
  Filled 2024-02-23 (×2): qty 1

## 2024-02-23 MED ORDER — PHENOL 1.4 % MT LIQD
1.0000 | OROMUCOSAL | Status: DC | PRN
Start: 2024-02-23 — End: 2024-02-24
  Administered 2024-02-23: 1 via OROMUCOSAL
  Filled 2024-02-23: qty 177

## 2024-02-23 MED ORDER — NICOTINE 14 MG/24HR TD PT24
14.0000 mg | MEDICATED_PATCH | Freq: Every day | TRANSDERMAL | Status: DC
  Administered 2024-02-23 – 2024-02-24 (×2): 14 mg via TRANSDERMAL
  Filled 2024-02-23 (×2): qty 1

## 2024-02-23 MED ORDER — ATENOLOL 25 MG PO TABS: 25.0000 mg | ORAL_TABLET | Freq: Every day | ORAL | Status: DC

## 2024-02-23 NOTE — Plan of Care (Signed)
  Problem: Education: Goal: Knowledge of General Education information will improve Description: Including pain rating scale, medication(s)/side effects and non-pharmacologic comfort measures Outcome: Progressing   Problem: Health Behavior/Discharge Planning: Goal: Ability to manage health-related needs will improve Outcome: Progressing   Problem: Clinical Measurements: Goal: Respiratory complications will improve Outcome: Progressing   Problem: Nutrition: Goal: Adequate nutrition will be maintained Outcome: Progressing   Problem: Coping: Goal: Level of anxiety will decrease Outcome: Progressing   Problem: Elimination: Goal: Will not experience complications related to urinary retention Outcome: Progressing   Problem: Pain Managment: Goal: General experience of comfort will improve and/or be controlled Outcome: Progressing   Problem: Education: Goal: Knowledge of General Education information will improve Description: Including pain rating scale, medication(s)/side effects and non-pharmacologic comfort measures Outcome: Progressing   Problem: Health Behavior/Discharge Planning: Goal: Ability to manage health-related needs will improve Outcome: Progressing   Problem: Clinical Measurements: Goal: Respiratory complications will improve Outcome: Progressing   Problem: Nutrition: Goal: Adequate nutrition will be maintained Outcome: Progressing   Problem: Coping: Goal: Level of anxiety will decrease Outcome: Progressing   Problem: Elimination: Goal: Will not experience complications related to urinary retention Outcome: Progressing   Problem: Pain Managment: Goal: General experience of comfort will improve and/or be controlled Outcome: Progressing   Problem: Education: Goal: Knowledge of General Education information will improve Description: Including pain rating scale, medication(s)/side effects and non-pharmacologic comfort measures Outcome: Progressing    Problem: Health Behavior/Discharge Planning: Goal: Ability to manage health-related needs will improve Outcome: Progressing   Problem: Clinical Measurements: Goal: Respiratory complications will improve Outcome: Progressing   Problem: Nutrition: Goal: Adequate nutrition will be maintained Outcome: Progressing   Problem: Coping: Goal: Level of anxiety will decrease Outcome: Progressing   Problem: Elimination: Goal: Will not experience complications related to urinary retention Outcome: Progressing   Problem: Pain Managment: Goal: General experience of comfort will improve and/or be controlled Outcome: Progressing

## 2024-02-23 NOTE — Progress Notes (Signed)
  Echocardiogram 2D Echocardiogram has been performed.  Bobby Nguyen 02/23/2024, 2:25 PM

## 2024-02-23 NOTE — Consult Note (Addendum)
 Cardiology Consultation   Patient ID: Bobby Nguyen MRN: [id number]; DOB: 1945/04/07  Admit date: 02/22/2024 Date of Consult: 02/23/2024  PCP:  Stephanie Charlene CROME, MD   [facility] Providers Cardiologist:  None        Patient Profile: Bobby Nguyen is a 78 y.o. male with a hx of asymptomatic bradycardia, cryptogenic stroke, and bladder tumor who is being seen 02/23/2024 for the evaluation of bradycardia at the request of Elgie Butter MD.  History of Present Illness: [name] is a 78 year old male with prior cardiac history listed below.  In 2017 the patient had a cryptogenic stroke.  Because of this he had a TEE echo that showed a  tiny PFO and no evidence of thrombus.  The patient also had a loop recorder implanted that showed no evidence of atrial fibrillation or other arrhythmia.  The patient has been followed by urology for his bladder cancer. On 12/27/2023 the patient had a TURP (transurethral resection of prostate), TURBT transurethral resection of bladder tumor, and stent placement.   Patient was hospitalized for a fall and back pain.  A UA was done and was positive for a UTI and had acute metabolic encephalopathy.  On interview patient reported that he has had episodes of lightheadedness/dizziness.  Denies any sensation of room spinning.  Patient was unable to recall what the cause was of his fall and the fall was unwitnessed.  Was unable to recall if had any dizziness or lightheadedness when he fell. He has active and is able to walk up 2 flights of stairs and around large grocery stores. Smokes about 1 pack of cigarettes per day.  Denies alcohol use or illicit substance use.  Labs showed high-sensitivity troponins of 16 > 18, potassium of 3.6, magnesium of 2.0, creatinine of 1.33, WBC count of 11.5, and anemia with a hemoglobin of 12.5.  Head CT showed no acute abnormality  CT chest abdomen and pelvis showed no acute traumatic injury, emphysema, aortic  atherosclerosis,  Chest x-ray showed Low lung volumes with mild bibasilar atelectasis.   EKG showed normal sinus rhythm with a rate of 70, 2 PVCs and prolonged QTcB of 488.  Past Medical History:  Diagnosis Date   Aortic atherosclerosis    Arthritis    Back pain    Bladder cancer (HCC) 12/2023   BPH (benign prostatic hyperplasia) 12/27/2023   Cancer (HCC)    skin cancer,right ear.   Chronic kidney disease    Chronic pain syndrome 02/22/2024   COPD (chronic obstructive pulmonary disease) (HCC)    Depression    Emphysema lung (HCC) 02/22/2024   Gout    Hyperlipidemia    Hypertension    Pneumonia    Renal cancer (HCC) 05/2022   Stroke [facility])    Ischemic MCA CVA    Past Surgical History:  Procedure Laterality Date   arm surgery Left    fracture surgery.Plate and screws in.   BACK SURGERY  2010   Thoracic fusion, kyphoplasty   CATARACT EXTRACTION W/ INTRAOCULAR LENS IMPLANT Bilateral    CYSTOSCOPY WITH STENT PLACEMENT Right 12/27/2023   Procedure: CYSTOSCOPY, WITH STENT INSERTION, RIGHT RETROGRADE;  Surgeon: Devere Lonni Righter, MD;  Location: [facility];  Service: Urology;  Laterality: Right;   EP IMPLANTABLE DEVICE N/A 10/06/2015   Procedure: Loop Recorder Insertion;  Surgeon: Lynwood Rakers, MD;  Location: [facility] INVASIVE CV LAB;  Service: Cardiovascular;  Laterality: N/A;   FRACTURE SURGERY Left    ORIF wrist,   ROBOT ASSISTED  LAPAROSCOPIC NEPHRECTOMY Left 07/08/2022   Procedure: XI ROBOTIC ASSISTED LEFT LAPAROSCOPIC NEPHRECTOMY;  Surgeon: Devere Lonni Righter, MD;  Location: [facility];  Service: Urology;  Laterality: Left;   TEE WITHOUT CARDIOVERSION N/A 10/08/2015   Procedure: TRANSESOPHAGEAL ECHOCARDIOGRAM (TEE);  Surgeon: Vina LULLA Gull, MD;  Location: [facility] ENDOSCOPY;  Service: Cardiovascular;  Laterality: N/A;   TRANSURETHRAL RESECTION OF BLADDER TUMOR N/A 12/27/2023   Procedure: TURBT (TRANSURETHRAL RESECTION OF BLADDER TUMOR);  Surgeon: Devere Lonni Righter, MD;   Location: [facility];  Service: Urology;  Laterality: N/A;   TRANSURETHRAL RESECTION OF PROSTATE N/A 12/27/2023   Procedure: TURP (TRANSURETHRAL RESECTION OF PROSTATE);  Surgeon: Devere Lonni Righter, MD;  Location: [facility];  Service: Urology;  Laterality: N/A;  TURP (TRANSURETHRAL RESECTION OF PROSTATE), TURBT (TRANSURETHRAL RESECTION OF BLADDER TUMOR), POSSIBLECYSTOSCOPY WITH RIGHT STENT INSERTION     Home Medications:  Prior to Admission medications   Medication Sig Start Date End Date Taking? Authorizing Provider  allopurinol  (ZYLOPRIM ) 300 MG tablet Take 300 mg by mouth in the morning. 08/06/15  Yes [provider]  amLODipine  (NORVASC ) 10 MG tablet Take 10 mg by mouth in the morning. 04/19/17  Yes [provider]  atenolol  (TENORMIN ) 50 MG tablet Take 50 mg by mouth in the morning. 07/22/15  Yes [provider]  bisacodyl  (DULCOLAX) 5 MG EC tablet Take 10 mg by mouth daily as needed for mild constipation or moderate constipation.   Yes [provider]  ciclopirox  (PENLAC ) 8 % solution Apply topically 2 (two) times daily as needed. Apply over nail and surrounding skin. Apply daily over previous coat. After seven (7) days, may remove with alcohol and continue cycle.   Yes [provider]  escitalopram  (LEXAPRO ) 10 MG tablet Take 10 mg by mouth daily. 04/19/17  Yes [provider]  fenofibrate  160 MG tablet Take 160 mg by mouth in the morning. 09/07/15  Yes [provider]  nitrofurantoin, macrocrystal-monohydrate, (MACROBID) 100 MG capsule Take 100 mg by mouth every 12 (twelve) hours. 02/19/24  Yes [provider]  oxybutynin  (DITROPAN ) 5 MG tablet Take 1 tablet (5 mg total) by mouth every 8 (eight) hours as needed for bladder spasms. 12/28/23  Yes Devere Lonni Righter, MD  oxyCODONE -acetaminophen  (PERCOCET) 10-325 MG tablet Take 1 tablet by mouth in the morning, at noon, and at bedtime. 11/01/23  Yes [provider]   phenazopyridine  (PYRIDIUM ) 200 MG tablet Take 1 tablet (200 mg total) by mouth 3 (three) times daily as needed (for pain with urination). Patient not taking: Reported on 02/22/2024 12/28/23 12/27/24  Devere Lonni Righter, MD    Scheduled Meds:  allopurinol   300 mg Oral q AM   enoxaparin  (LOVENOX ) injection  40 mg Subcutaneous Q24H   escitalopram   10 mg Oral Daily   fenofibrate   160 mg Oral q AM   nicotine   14 mg Transdermal Daily   Continuous Infusions:  azithromycin      cefTRIAXone  (ROCEPHIN )  IV     PRN Meds: acetaminophen  **OR** acetaminophen , ondansetron  **OR** ondansetron  (ZOFRAN ) IV, oxyCODONE , phenol  Allergies:    Allergies  Allergen Reactions   Atorvastatin Other (See Comments)    constipation   Pravastatin Sodium Other (See Comments)    sweating   Solifenacin Other (See Comments)    sweating   Penicillins Itching, Swelling and Rash    Peripheral swelling    Social History:   Social History   Socioeconomic History   Marital status: Married    Spouse name: Not on  file   Number of children: Not on file   Years of education: Not on file   Highest education level: Not on file  Occupational History   Not on file  Tobacco Use   Smoking status: Every Day    Current packs/day: 1.00    Types: Cigarettes   Smokeless tobacco: Never   Tobacco comments:    1 based on weather  Vaping Use   Vaping status: Never Used  Substance and Sexual Activity   Alcohol use: Yes    Alcohol/week: 4.0 standard drinks of alcohol    Types: 4 Cans of beer per week    Comment: weekend   Drug use: No   Sexual activity: Not Currently  Other Topics Concern   Not on file  Social History Narrative   Not on file   Social Drivers of Health   Financial Resource Strain: Not on file  Food Insecurity: No Food Insecurity (02/22/2024)   Hunger Vital Sign    Worried About Running Out of Food in the Last Year: Never true    Ran Out of Food in the Last Year: Never true   Transportation Needs: No Transportation Needs (02/22/2024)   PRAPARE - Administrator, Civil Service (Medical): No    Lack of Transportation (Non-Medical): No  Physical Activity: Not on file  Stress: Not on file  Social Connections: Moderately Isolated (02/22/2024)   Social Connection and Isolation Panel    Frequency of Communication with Friends and Family: More than three times a week    Frequency of Social Gatherings with Friends and Family: More than three times a week    Attends Religious Services: Never    Database Administrator or Organizations: No    Attends Banker Meetings: Never    Marital Status: Married  Catering Manager Violence: Not At Risk (02/22/2024)   Humiliation, Afraid, Rape, and Kick questionnaire    Fear of Current or Ex-Partner: No    Emotionally Abused: No    Physically Abused: No    Sexually Abused: No    Family History:   History reviewed. No pertinent family history.   ROS:  Please see the history of present illness.   All other ROS reviewed and negative.     Physical Exam/Data: Vitals:   02/23/24 0024 02/23/24 0325 02/23/24 0345 02/23/24 0854  BP: (!) 165/57 (!) 176/51 (!) 176/51 (!) 153/56  Pulse: (!) 55 (!) 48  (!) 50  Resp: 17 18  12   Temp: 98.2 F (36.8 C) 98.1 F (36.7 C)    TempSrc: Oral Oral    SpO2: 93% 90%  95%  Weight:      Height:        Intake/Output Summary (Last 24 hours) at 02/23/2024 0958 Last data filed at 02/22/2024 2300 Gross per 24 hour  Intake 1545.93 ml  Output 350 ml  Net 1195.93 ml      02/22/2024    7:30 AM 12/27/2023    4:50 PM 12/27/2023    9:08 AM  Last 3 Weights  Weight (lbs) 162 lb 162 lb 11.2 oz 158 lb 1.1 oz  Weight (kg) 73.483 kg 73.8 kg 71.7 kg     Body mass index is 26.15 kg/m.  General:  Well nourished, well developed, in no acute distress. HEENT: normal Neck: no JVD Vascular: No carotid bruits; Distal pulses 2+ bilaterally Cardiac:  normal S1, S2; RRR; no murmur   Lungs:  clear to auscultation bilaterally, no wheezing,  rhonchi or rales  Abd: soft, nontender, no hepatomegaly  Ext: no edema Musculoskeletal:  No deformities, BUE and BLE strength normal and equal Skin: warm and dry  Neuro:   no focal abnormalities noted Psych:  Normal affect   EKG:  The EKG was personally reviewed and demonstrates: normal sinus rhythm with a rate of 70, 2 PVCs and prolonged QTcB of 488. Telemetry:  Telemetry was personally reviewed and demonstrates: Normal sinus rhythm with heart rates in the 30s to 60s.  Has had several pauses the longest of which also was 2.84 seconds at 10:02 AM  Relevant CV Studies: Echo pendin  Laboratory Data: High Sensitivity Troponin:  No results for input(s): TROPONINIHS in the last 720 hours.   Chemistry Recent Labs  Lab 02/22/24 0905 02/23/24 0356  NA 139 140  K 3.9 3.6  CL 103 105  CO2 23 24  GLUCOSE 101* 95  BUN 23 19  CREATININE 1.46* 1.33*  CALCIUM 10.0 9.3  GFRNONAA 49* 55*  ANIONGAP 13 11    Recent Labs  Lab 02/22/24 0905 02/23/24 0356  PROT 7.8 6.6  ALBUMIN 4.3 3.7  AST 25 23  ALT <5 <5  ALKPHOS 65 58  BILITOT 0.5 0.4   Lipids No results for input(s): CHOL, TRIG, HDL, LABVLDL, LDLCALC, CHOLHDL in the last 168 hours.  Hematology Recent Labs  Lab 02/22/24 0905 02/23/24 0356  WBC 12.6* 11.5*  RBC 4.73 4.04*  HGB 14.6 12.5*  HCT 44.6 38.6*  MCV 94.3 95.5  MCH 30.9 30.9  MCHC 32.7 32.4  RDW 15.0 15.0  PLT 255 217   Thyroid  No results for input(s): TSH, FREET4 in the last 168 hours.  BNPNo results for input(s): BNP, PROBNP in the last 168 hours.  DDimer No results for input(s): DDIMER in the last 168 hours.  Radiology/Studies:  CT T-SPINE NO CHARGE Result Date: 02/22/2024 EXAM: CT THORACIC SPINE WITHOUT CONTRAST 02/22/2024 11:34:35 AM TECHNIQUE: CT of the thoracic spine was performed without the administration of intravenous contrast. Multiplanar reformatted images are provided  for review. Automated exposure control, iterative reconstruction, and/or weight based adjustment of the mA/kV was utilized to reduce the radiation dose to as low as reasonably achievable. COMPARISON: None available. CLINICAL HISTORY: FINDINGS: BONES AND ALIGNMENT: There is a chronic compression deformity of T6 which has been treated with bilateral vertebral augmentation. There are chronic nonunited fracture deformities of the spinous processes of T1, T2 and T4. No acute fracture or suspicious bone lesion. Normal alignment. DEGENERATIVE CHANGES: There are anterior and lateral osteophytes present throughout the thoracic spine. SOFT TISSUES: No acute abnormality. IMPRESSION: 1. Chronic compression deformity of T6 treated with bilateral vertebral augmentation. 2. Chronic nonunited fracture deformities of the spinous processes of T1, T2, and T4. 3. No acute fracture. Electronically signed by: Evalene Coho MD 02/22/2024 12:07 PM EST RP Workstation: [id number]   CT L-SPINE NO CHARGE Result Date: 02/22/2024 EXAM: CT OF THE LUMBAR SPINE WITHOUT CONTRAST 02/22/2024 11:34:35 AM TECHNIQUE: CT of the lumbar spine was performed without the administration of intravenous contrast. Multiplanar reformatted images are provided for review. Automated exposure control, iterative reconstruction, and/or weight based adjustment of the mA/kV was utilized to reduce the radiation dose to as low as reasonably achievable. COMPARISON: None available. CLINICAL HISTORY: FINDINGS: BONES AND ALIGNMENT: Normal vertebral body heights. There is no evidence of fracture. There is a nonunited deformity of the left transverse process of L1. No suspicious bone lesion. Normal alignment. DEGENERATIVE CHANGES: There is moderate chronic degenerative disc disease  at L5-S1. SOFT TISSUES: The patient is status post left nephrectomy. There is extensive atheromatous disease within the abdominal aorta. IMPRESSION: 1. No acute findings. 2. Moderate chronic  degenerative disc disease at L5-S1. 3. Status post left nephrectomy. 4. Extensive atheromatous disease of the abdominal aorta. Electronically signed by: Evalene Coho MD 02/22/2024 12:04 PM EST RP Workstation: [id number]   CT HEAD WO CONTRAST Result Date: 02/22/2024 EXAM: CT HEAD AND CERVICAL SPINE 02/22/2024 11:34:35 AM TECHNIQUE: CT of the head and cervical spine was performed without the administration of intravenous contrast. Multiplanar reformatted images are provided for review. Automated exposure control, iterative reconstruction, and/or weight based adjustment of the mA/kV was utilized to reduce the radiation dose to as low as reasonably achievable. COMPARISON: CT Head and CT cervical spine June 19, 2007 CLINICAL HISTORY: Head trauma, moderate-severe FINDINGS: CT HEAD BRAIN AND VENTRICLES: No acute intracranial hemorrhage. No mass effect or midline shift. No abnormal extra-axial fluid collection. No evidence of acute infarct. Remote right MCA territory infarct, new since 2009. No hydrocephalus. ORBITS: No acute abnormality. SINUSES AND MASTOIDS: No acute abnormality. SOFT TISSUES AND SKULL: No acute skull fracture. No acute soft tissue abnormality. CT CERVICAL SPINE BONES AND ALIGNMENT: No acute fracture or traumatic malalignment. Subtle sequelae of remote lower cervical and upper thoracic fractures. DEGENERATIVE CHANGES: Mild for age bony degenerative change. SOFT TISSUES: No prevertebral soft tissue swelling. IMPRESSION: 1. No acute intracranial abnormality. 2. No acute fracture or traumatic malalignment of the cervical spine. 3. Remote right MCA territory infarct, new since 2009. Electronically signed by: Gilmore Molt MD 02/22/2024 11:49 AM EST RP Workstation: [id number]   CT CERVICAL SPINE WO CONTRAST Result Date: 02/22/2024 EXAM: CT HEAD AND CERVICAL SPINE 02/22/2024 11:34:35 AM TECHNIQUE: CT of the head and cervical spine was performed without the administration of intravenous contrast.  Multiplanar reformatted images are provided for review. Automated exposure control, iterative reconstruction, and/or weight based adjustment of the mA/kV was utilized to reduce the radiation dose to as low as reasonably achievable. COMPARISON: CT Head and CT cervical spine June 19, 2007 CLINICAL HISTORY: Head trauma, moderate-severe FINDINGS: CT HEAD BRAIN AND VENTRICLES: No acute intracranial hemorrhage. No mass effect or midline shift. No abnormal extra-axial fluid collection. No evidence of acute infarct. Remote right MCA territory infarct, new since 2009. No hydrocephalus. ORBITS: No acute abnormality. SINUSES AND MASTOIDS: No acute abnormality. SOFT TISSUES AND SKULL: No acute skull fracture. No acute soft tissue abnormality. CT CERVICAL SPINE BONES AND ALIGNMENT: No acute fracture or traumatic malalignment. Subtle sequelae of remote lower cervical and upper thoracic fractures. DEGENERATIVE CHANGES: Mild for age bony degenerative change. SOFT TISSUES: No prevertebral soft tissue swelling. IMPRESSION: 1. No acute intracranial abnormality. 2. No acute fracture or traumatic malalignment of the cervical spine. 3. Remote right MCA territory infarct, new since 2009. Electronically signed by: Gilmore Molt MD 02/22/2024 11:49 AM EST RP Workstation: [id number]   CT CHEST ABDOMEN PELVIS W CONTRAST Result Date: 02/22/2024 EXAM: CT CHEST, ABDOMEN AND PELVIS WITH CONTRAST 02/22/2024 11:34:35 AM TECHNIQUE: CT of the chest, abdomen and pelvis was performed with the administration of 80 mL of iohexol  (OMNIPAQUE ) 300 MG/ML solution. Multiplanar reformatted images are provided for review. Automated exposure control, iterative reconstruction, and/or weight based adjustment of the mA/kV was utilized to reduce the radiation dose to as low as reasonably achievable. COMPARISON: CT of the chest dated 07/24/2023 and CT of the abdomen dated 06/13/2022. CLINICAL HISTORY: Polytrauma, blunt. FINDINGS: CHEST: MEDIASTINUM AND LYMPH  NODES: The heart is mildly enlarged.  Pericardium is unremarkable. The central airways are clear. No mediastinal, hilar or axillary lymphadenopathy. LUNGS AND PLEURA: Mild-to-moderate central lobular and paraseptal emphysema. There are streaky peribronchovascular opacities present within the lower lobes bilaterally. There is also mild bronchiectasis. No focal consolidation or pulmonary edema. No pleural effusion or pneumothorax. ABDOMEN AND PELVIS: LIVER: There is a vascular blush present within the periphery of segment 7 of the liver. There is also a small cyst within segment 6. GALLBLADDER AND BILE DUCTS: Gallbladder is unremarkable. No biliary ductal dilatation. SPLEEN: No acute abnormality. PANCREAS: No acute abnormality. ADRENAL GLANDS: No acute abnormality. KIDNEYS, URETERS AND BLADDER: There is a hemorrhagic cyst again demonstrated in the superior pole of the right kidney. The left kidney is surgically absent. No stones in the right kidney or ureter. No hydronephrosis. No perinephric or periureteral stranding. Urinary bladder is unremarkable. GI AND BOWEL: Stomach demonstrates no acute abnormality. There is no bowel obstruction. REPRODUCTIVE ORGANS: The patient is status post prostatectomy. PERITONEUM AND RETROPERITONEUM: No ascites. No free air. VASCULATURE: Aorta is normal in caliber. There is extensive calcific atheromatous disease throughout the thoracic aorta. ABDOMINAL AND PELVIS LYMPH NODES: No lymphadenopathy. BONES AND SOFT TISSUES: The bones appear intact. No acute osseous abnormality. No focal soft tissue abnormality. There is no evidence of acute traumatic injury. IMPRESSION: 1. No evidence of acute traumatic injury. 2. Mild-to-moderate centrilobular and paraseptal emphysema. Given emphysema is an independent lung cancer risk factor, consider evaluation for eligibility for a low-dose CT lung cancer screening program. 3. Streaky peribronchovascular opacities in the lower lobes bilaterally with mild  bronchiectasis. 4. Mild cardiomegaly. 5. Extensive calcific atheromatous disease throughout the thoracic aorta. 6. Vascular blush in the periphery of segment 7 of the liver and a small cyst in segment 6. 7. Hemorrhagic cyst in the superior pole of the right kidney. Left kidney is surgically absent. Electronically signed by: Evalene Coho MD 02/22/2024 11:47 AM EST RP Workstation: [id number]   DG Pelvis Portable Result Date: 02/22/2024 CLINICAL DATA:  Fall down stairs.  Pelvic pain. EXAM: PORTABLE PELVIS 1-2 VIEWS COMPARISON:  None Available. FINDINGS: There is no evidence of pelvic fracture or diastasis. No pelvic bone lesions are seen. Bilateral iliac and peripheral vascular calcification noted. IMPRESSION: No acute findings. Electronically Signed   By: Norleen DELENA Kil M.D.   On: 02/22/2024 09:19   DG Chest Port 1 View Result Date: 02/22/2024 CLINICAL DATA:  Fall down stairs. EXAM: PORTABLE CHEST 1 VIEW COMPARISON:  03/04/2022 FINDINGS: The heart size and mediastinal contours are within normal limits. Cardiac loop recorder again seen. Decreased lung volumes are seen with mild bibasilar atelectasis. No evidence of pulmonary consolidation or pleural effusion. No evidence of pneumothorax or hemothorax. Both lungs are clear. The visualized skeletal structures are unremarkable. IMPRESSION: Low lung volumes with mild bibasilar atelectasis. Electronically Signed   By: Norleen DELENA Kil M.D.   On: 02/22/2024 09:18     Assessment and Plan:  AHAN EISENBERGER is a 78 y.o. male with a hx of asymptomatic bradycardia, cryptogenic stroke, and bladder tumor who is being seen 02/23/2024 for the evaluation of bradycardia at the request of Elgie Butter MD.  Fall Possible syncope Bradycardia Patient previously had a loop recorder placed and had asymptomatic bradycardia. Reported that he has had had several episodes of lightheadedness and dizziness.  Said that he needs to be careful when he stands up. Patient was unable to  recall what the cause was of his fall and the fall was unwitnessed.  Was unable to recall  if had any dizziness or lightheadedness when he fell.  He was climbing up a flight of stairs when he fell. Atenolol  was stopped. Plan to order an outpatient 30-day monitor. Order echocardiogram Order orthostatic vital signs   Hypertension Blood pressures have been elevated.  Most recent BP at about 9 AM was 153/56. Restart amlodipine  10 mg daily. May consider starting hydralazine if blood pressures remain elevated.   Risk Assessment/Risk Scores:  For questions or updates, please contact [facility] Please consult [email or web address] for contact info under   Signed, Morse Clause, PA-C  02/23/2024 9:58 AM  Patient seen and examined, note reviewed with the signed Advanced Practice Provider. I personally reviewed laboratory data, imaging studies and relevant notes. I independently examined the patient and formulated the important aspects of the plan. I have personally discussed the plan with the patient and/or family. Comments or changes to the note/plan are indicated below.   Bradycardia Recent fall/syncope Hypertensive heart disease  He is bradycardic but he is on atenolol  which I suspect his beta-blocker is making this worse.  Agree with stopping the beta-blocker.  Restart his amlodipine  and add hydralazine if blood pressure is not well-controlled. Plan discussed with the patient and his family. If he continues to be bradycardic at the time of his discharge okay to place a monitor on the patient  Michal Callicott DO, MS [facility] Attending Cardiologist [facility]  [address] [place], [place] [address] [phone] Website: [email or web address]

## 2024-02-23 NOTE — TOC Initial Note (Signed)
 Transition of Care [facility]) - Initial/Assessment Note    Patient Details  Name: Bobby Nguyen MRN: [id number] Date of Birth: 11/04/1945  Transition of Care [facility]) CM/SW Contact:    Sonda Manuella Quill, RN Phone Number: 02/23/2024, 4:43 PM  Clinical Narrative:                 [place] w/ pt and family at bedside Diane Grenier (spouse) [phone] / Eleanor Potters (dtr) [phone]; pt said he lives at home; he plans to return w/ their support at d/c; family will provide transportation; pt verified insurance/PCP; he denied SDOH risks; pt does not have DME, HH services, or home oxygen; IP CM is following.  Expected Discharge Plan: Home/Self Care Barriers to Discharge: Continued Medical Work up   Patient Goals and CMS Choice Patient states their goals for this hospitalization and ongoing recovery are:: home     [place] ownership interest in [facility].provided to:: Patient    Expected Discharge Plan and Services In-house Referral:  (n/a) Discharge Planning Services: NA   Living arrangements for the past 2 months: Single Family Home                 DME Arranged: N/A DME Agency: NA       HH Arranged: NA HH Agency: NA        Prior Living Arrangements/Services Living arrangements for the past 2 months: Single Family Home Lives with:: Spouse Patient language and need for interpreter reviewed:: Yes Do you feel safe going back to the place where you live?: Yes      Need for Family Participation in Patient Care: Yes (Comment) Care giver support system in place?: Yes (comment)   Criminal Activity/Legal Involvement Pertinent to Current Situation/Hospitalization: No - Comment as needed  Activities of Daily Living   ADL Screening (condition at time of admission) Independently performs ADLs?: Yes (appropriate for developmental age) Is the patient deaf or have difficulty hearing?: Yes Does the patient have difficulty seeing, even when wearing glasses/contacts?:  No Does the patient have difficulty concentrating, remembering, or making decisions?: Yes  Permission Sought/Granted Permission sought to share information with : Case Manager Permission granted to share information with : Yes, Verbal Permission Granted  Share Information with NAME: Case Manager     Permission granted to share info w Relationship: Rodric Punch (spouse) [phone] / Eleanor Potters (dtr) [phone]     Emotional Assessment Appearance:: Appears stated age Attitude/Demeanor/Rapport: Gracious Affect (typically observed): Accepting Orientation: : Oriented to Self, Oriented to Place, Oriented to  Time, Oriented to Situation Alcohol / Substance Use: Not Applicable Psych Involvement: No (comment)  Admission diagnosis:  Acute cystitis with hematuria [N30.01] Acute metabolic encephalopathy [G93.41] Patient Active Problem List   Diagnosis Date Noted   Chronic pain syndrome 02/22/2024   Acute metabolic encephalopathy 02/22/2024   Acute cystitis 02/22/2024   Bilateral atelectasis 02/22/2024   CKD stage 3a, GFR 45-59 ml/min (HCC) 02/22/2024   Emphysema lung (HCC) 02/22/2024   Thoracic vertebral fracture (HCC) 02/22/2024   Aortic atherosclerosis 02/22/2024   Gout 02/22/2024   Contusion of right elbow 02/22/2024   Abrasion of right elbow 02/22/2024   Contusion of auricle of left ear 02/22/2024   Abrasion of left ear 02/22/2024   BPH (benign prostatic hyperplasia) 12/27/2023   Primary renal cell carcinoma of left kidney (HCC) 10/12/2022   Renal mass 07/08/2022   Tobacco use 08/03/2021   Overweight (BMI 25.0-29.9) 08/03/2021   Chronic, continuous use of opioids 02/02/2021  Cerebrovascular accident (CVA) (HCC)    Acute CVA (cerebrovascular accident) (HCC) 10/05/2015   Acute ischemic right middle cerebral artery (MCA) stroke (HCC) 10/04/2015   TIA (transient ischemic attack) 10/04/2015   PCP:  Stephanie Charlene CROME, MD Pharmacy:   [id number] [address], [place] - [address] AT [facility] [address] [place] [address] Phone: [phone] Fax: [phone]  [facility]) - [place], [place] - [address] [facility] [address] [place] [address] [place] [address] Phone: [phone] Fax: [phone]     Social Drivers of Health (SDOH) Social History: SDOH Screenings   Food Insecurity: No Food Insecurity (02/23/2024)  Housing: Low Risk  (02/23/2024)  Transportation Needs: No Transportation Needs (02/23/2024)  Utilities: Not At Risk (02/23/2024)  Social Connections: Moderately Isolated (02/22/2024)  Tobacco Use: High Risk (02/22/2024)   SDOH Interventions: Food Insecurity Interventions: Intervention Not Indicated, Inpatient TOC Housing Interventions: Intervention Not Indicated, Inpatient TOC Transportation Interventions: Intervention Not Indicated, Inpatient TOC Utilities Interventions: Intervention Not Indicated, Inpatient TOC   Readmission Risk Interventions    02/23/2024    4:39 PM  Readmission Risk Prevention Plan  Transportation Screening Complete  PCP or Specialist Appt within 5-7 Days Complete  Home Care Screening Complete  Medication Review (RN CM) Complete

## 2024-02-23 NOTE — Progress Notes (Signed)
 Orthostatic Vitals taken at 1250.  BP lying:   130/44  Pulse:  57 Sitting:  136/51   60 Standing: 143/65   67 3 min stand: 161/54   65

## 2024-02-23 NOTE — Progress Notes (Signed)
 Orders for 30 day cardiac monitor for syncopy. Dr Sheena to read.

## 2024-02-23 NOTE — Progress Notes (Addendum)
 [facility]                                                                               Diamond Martucci, is a 78 y.o. male, DOB - 09/15/1945, FMW:[id number] Admit date - 02/22/2024    Outpatient Primary MD for the patient is Hamrick, Charlene CROME, MD  LOS - 1  days    Brief summary    Bobby Nguyen is a 78 y.o. male with medical history significant of aortic atherosclerosis, osteoarthritis, back pain, BPH, bladder cancer, CKD, chronic pain syndrome, emphysema/COPD, depression, gout, hyperlipidemia, hypertension, history of pneumonia, renal cancer, left nephrectomy, history of tobacco use, history of ischemic CVA who was brought to the emergency department after having a fall at home and being found with altered mental status by his wife who provides that portion of the HPI as the patient does not have good recollection of the events of this morning.  He normally is sleeps downstairs, but does not remember entirely how he went upstairs and fell. He was recently treated for UTI with nitrofurantoin.   CT chest/abdomen/pelvis with contrast showing mild cardiomegaly.  Mild to moderate centrilobular and paraseptal emphysema.  Streaky peribronchovascular opacities in the lower lobes bilaterally.  Mild bronchiectasis.  No edema or focal consolidation.  No effusion or pneumothorax.  Thoracic aorta atheromatous calcification.  Hepatic cysts.  Hemorrhagic cyst in the superior pole of the right kidney.  Surgically absent left kidney.  Thoracic spine showed chronic compression deformity of T6 treated with bilateral vertebral augmentation.  Chronic nonunited fracture deformities of the spinous processes of T1, T2 and T4.  No acute fracture.  CT lumbar spine with no acute findings.  There is moderate chronic DDD L5-S1.  Extensive atheromatous disease of abdominal aorta.   He was admitted for possible recurrent UTI and multiple falls.    Assessment & Plan    Assessment and Plan:  Recurrent  cystitis Recently completed nitrofurantoin.  Follow up urine and blood cultures.  Continue with IV Ceftriaxone .  Recommend outpatient follow up with urology in view of his bladder cancer.     Emphysema:  With some mild bronchiectasis and acute bronchitis.  Continue with IV antibiotics and get sputum cultures if able.     Contusion of the left ear from the fall.  Pain control.    Falls with episodes of bradycardia.  Holding atenolol .  Cardiology consulted for recommendations.  Echocardiogram ordered.    Stage 3 a CKD Creatinine at 1.3, much better than baseline.  S/p left nephrectomy.    Hypertension Well controlled.  Continue with norvasc .    Hyperlipidemia   Tobacco abuse Counseled on cessation.  Nicotine  patch ordered.   H/o osteoarthritis Pain control and physical therapy.    Estimated body mass index is 26.15 kg/m as calculated from the following:   Height as of this encounter: 5' 6 (1.676 m).   Weight as of this encounter: 73.5 kg.  Code Status: full code.  DVT Prophylaxis:  enoxaparin  (LOVENOX ) injection 40 mg Start: 02/22/24 2200   Level of Care: Level of care: Telemetry Family Communication: none at bedside.   Disposition Plan:     Remains inpatient  appropriate:  pending clinical improvement.   Procedures:  None.   Consultants:   Cardiology.   Antimicrobials:   Anti-infectives (From admission, onward)    Start     Dose/Rate Route Frequency Ordered Stop   02/23/24 1400  azithromycin  (ZITHROMAX ) 500 mg in sodium chloride  0.9 % 250 mL IVPB        500 mg 250 mL/hr over 60 Minutes Intravenous Every 24 hours 02/22/24 1320 02/27/24 1359   02/23/24 1300  cefTRIAXone  (ROCEPHIN ) 1 g in sodium chloride  0.9 % 100 mL IVPB        1 g 200 mL/hr over 30 Minutes Intravenous Every 24 hours 02/22/24 1320 02/27/24 1259   02/22/24 1300  cefTRIAXone  (ROCEPHIN ) 1 g in sodium chloride  0.9 % 100 mL IVPB        1 g 200 mL/hr over 30 Minutes Intravenous   Once 02/22/24 1253 02/22/24 1358   02/22/24 1300  azithromycin  (ZITHROMAX ) 500 mg in sodium chloride  0.9 % 250 mL IVPB        500 mg 250 mL/hr over 60 Minutes Intravenous  Once 02/22/24 1253 02/22/24 1457        Medications  Scheduled Meds:  allopurinol   300 mg Oral q AM   amLODipine   10 mg Oral Daily   enoxaparin  (LOVENOX ) injection  40 mg Subcutaneous Q24H   escitalopram   10 mg Oral Daily   fenofibrate   160 mg Oral q AM   nicotine   14 mg Transdermal Daily   Continuous Infusions:  azithromycin      cefTRIAXone  (ROCEPHIN )  IV 1 g (02/23/24 1330)   PRN Meds:.acetaminophen  **OR** acetaminophen , ondansetron  **OR** ondansetron  (ZOFRAN ) IV, oxyCODONE , phenol    Subjective:   Bobby Nguyen was seen and examined today.  No new complaints. He denies any chest pain.    Objective:   Vitals:   02/23/24 0325 02/23/24 0345 02/23/24 0854 02/23/24 1250  BP: (!) 176/51 (!) 176/51 (!) 153/56 (!) 130/44  Pulse: (!) 48  (!) 50 (!) 57  Resp: 18  12 16   Temp: 98.1 F (36.7 C)   98.7 F (37.1 C)  TempSrc: Oral   Oral  SpO2: 90%  95% 94%  Weight:      Height:        Intake/Output Summary (Last 24 hours) at 02/23/2024 1423 Last data filed at 02/22/2024 2300 Gross per 24 hour  Intake 448.58 ml  Output 350 ml  Net 98.58 ml   Filed Weights   02/22/24 0730  Weight: 73.5 kg     Exam General: Alert and oriented x 3, NAD Cardiovascular: S1 S2 auscultated, no murmurs, bradycardia.  Respiratory: Clear to auscultation bilaterally, no wheezing, rales or rhonchi Gastrointestinal: Soft, nontender, nondistended, + bowel sounds Ext: no pedal edema bilaterally Neuro: AAOx3,  Skin: No rashes    Data Reviewed:  I have personally reviewed following labs and imaging studies   CBC Lab Results  Component Value Date   WBC 11.5 (H) 02/23/2024   RBC 4.04 (L) 02/23/2024   HGB 12.5 (L) 02/23/2024   HCT 38.6 (L) 02/23/2024   MCV 95.5 02/23/2024   MCH 30.9 02/23/2024   PLT 217 02/23/2024    MCHC 32.4 02/23/2024   RDW 15.0 02/23/2024   LYMPHSABS 2.0 02/22/2024   MONOABS 0.8 02/22/2024   EOSABS 0.2 02/22/2024   BASOSABS 0.1 02/22/2024     Last metabolic panel Lab Results  Component Value Date   NA 140 02/23/2024   K 3.6 02/23/2024   CL 105  02/23/2024   CO2 24 02/23/2024   BUN 19 02/23/2024   CREATININE 1.33 (H) 02/23/2024   GLUCOSE 95 02/23/2024   GFRNONAA 55 (L) 02/23/2024   GFRAA 58 (L) 10/06/2015   CALCIUM 9.3 02/23/2024   PROT 6.6 02/23/2024   ALBUMIN 3.7 02/23/2024   BILITOT 0.4 02/23/2024   ALKPHOS 58 02/23/2024   AST 23 02/23/2024   ALT <5 02/23/2024   ANIONGAP 11 02/23/2024    CBG (last 3)  No results for input(s): GLUCAP in the last 72 hours.    Coagulation Profile: Recent Labs  Lab 02/22/24 0905  INR 0.9     Radiology Studies: CT T-SPINE NO CHARGE Result Date: 02/22/2024 EXAM: CT THORACIC SPINE WITHOUT CONTRAST 02/22/2024 11:34:35 AM TECHNIQUE: CT of the thoracic spine was performed without the administration of intravenous contrast. Multiplanar reformatted images are provided for review. Automated exposure control, iterative reconstruction, and/or weight based adjustment of the mA/kV was utilized to reduce the radiation dose to as low as reasonably achievable. COMPARISON: None available. CLINICAL HISTORY: FINDINGS: BONES AND ALIGNMENT: There is a chronic compression deformity of T6 which has been treated with bilateral vertebral augmentation. There are chronic nonunited fracture deformities of the spinous processes of T1, T2 and T4. No acute fracture or suspicious bone lesion. Normal alignment. DEGENERATIVE CHANGES: There are anterior and lateral osteophytes present throughout the thoracic spine. SOFT TISSUES: No acute abnormality. IMPRESSION: 1. Chronic compression deformity of T6 treated with bilateral vertebral augmentation. 2. Chronic nonunited fracture deformities of the spinous processes of T1, T2, and T4. 3. No acute fracture.  Electronically signed by: Evalene Coho MD 02/22/2024 12:07 PM EST RP Workstation: [id number]   CT L-SPINE NO CHARGE Result Date: 02/22/2024 EXAM: CT OF THE LUMBAR SPINE WITHOUT CONTRAST 02/22/2024 11:34:35 AM TECHNIQUE: CT of the lumbar spine was performed without the administration of intravenous contrast. Multiplanar reformatted images are provided for review. Automated exposure control, iterative reconstruction, and/or weight based adjustment of the mA/kV was utilized to reduce the radiation dose to as low as reasonably achievable. COMPARISON: None available. CLINICAL HISTORY: FINDINGS: BONES AND ALIGNMENT: Normal vertebral body heights. There is no evidence of fracture. There is a nonunited deformity of the left transverse process of L1. No suspicious bone lesion. Normal alignment. DEGENERATIVE CHANGES: There is moderate chronic degenerative disc disease at L5-S1. SOFT TISSUES: The patient is status post left nephrectomy. There is extensive atheromatous disease within the abdominal aorta. IMPRESSION: 1. No acute findings. 2. Moderate chronic degenerative disc disease at L5-S1. 3. Status post left nephrectomy. 4. Extensive atheromatous disease of the abdominal aorta. Electronically signed by: Evalene Coho MD 02/22/2024 12:04 PM EST RP Workstation: [id number]   CT HEAD WO CONTRAST Result Date: 02/22/2024 EXAM: CT HEAD AND CERVICAL SPINE 02/22/2024 11:34:35 AM TECHNIQUE: CT of the head and cervical spine was performed without the administration of intravenous contrast. Multiplanar reformatted images are provided for review. Automated exposure control, iterative reconstruction, and/or weight based adjustment of the mA/kV was utilized to reduce the radiation dose to as low as reasonably achievable. COMPARISON: CT Head and CT cervical spine June 19, 2007 CLINICAL HISTORY: Head trauma, moderate-severe FINDINGS: CT HEAD BRAIN AND VENTRICLES: No acute intracranial hemorrhage. No mass effect or midline  shift. No abnormal extra-axial fluid collection. No evidence of acute infarct. Remote right MCA territory infarct, new since 2009. No hydrocephalus. ORBITS: No acute abnormality. SINUSES AND MASTOIDS: No acute abnormality. SOFT TISSUES AND SKULL: No acute skull fracture. No acute soft tissue abnormality. CT CERVICAL SPINE BONES  AND ALIGNMENT: No acute fracture or traumatic malalignment. Subtle sequelae of remote lower cervical and upper thoracic fractures. DEGENERATIVE CHANGES: Mild for age bony degenerative change. SOFT TISSUES: No prevertebral soft tissue swelling. IMPRESSION: 1. No acute intracranial abnormality. 2. No acute fracture or traumatic malalignment of the cervical spine. 3. Remote right MCA territory infarct, new since 2009. Electronically signed by: Gilmore Molt MD 02/22/2024 11:49 AM EST RP Workstation: [id number]   CT CERVICAL SPINE WO CONTRAST Result Date: 02/22/2024 EXAM: CT HEAD AND CERVICAL SPINE 02/22/2024 11:34:35 AM TECHNIQUE: CT of the head and cervical spine was performed without the administration of intravenous contrast. Multiplanar reformatted images are provided for review. Automated exposure control, iterative reconstruction, and/or weight based adjustment of the mA/kV was utilized to reduce the radiation dose to as low as reasonably achievable. COMPARISON: CT Head and CT cervical spine June 19, 2007 CLINICAL HISTORY: Head trauma, moderate-severe FINDINGS: CT HEAD BRAIN AND VENTRICLES: No acute intracranial hemorrhage. No mass effect or midline shift. No abnormal extra-axial fluid collection. No evidence of acute infarct. Remote right MCA territory infarct, new since 2009. No hydrocephalus. ORBITS: No acute abnormality. SINUSES AND MASTOIDS: No acute abnormality. SOFT TISSUES AND SKULL: No acute skull fracture. No acute soft tissue abnormality. CT CERVICAL SPINE BONES AND ALIGNMENT: No acute fracture or traumatic malalignment. Subtle sequelae of remote lower cervical and upper  thoracic fractures. DEGENERATIVE CHANGES: Mild for age bony degenerative change. SOFT TISSUES: No prevertebral soft tissue swelling. IMPRESSION: 1. No acute intracranial abnormality. 2. No acute fracture or traumatic malalignment of the cervical spine. 3. Remote right MCA territory infarct, new since 2009. Electronically signed by: Gilmore Molt MD 02/22/2024 11:49 AM EST RP Workstation: [id number]   CT CHEST ABDOMEN PELVIS W CONTRAST Result Date: 02/22/2024 EXAM: CT CHEST, ABDOMEN AND PELVIS WITH CONTRAST 02/22/2024 11:34:35 AM TECHNIQUE: CT of the chest, abdomen and pelvis was performed with the administration of 80 mL of iohexol  (OMNIPAQUE ) 300 MG/ML solution. Multiplanar reformatted images are provided for review. Automated exposure control, iterative reconstruction, and/or weight based adjustment of the mA/kV was utilized to reduce the radiation dose to as low as reasonably achievable. COMPARISON: CT of the chest dated 07/24/2023 and CT of the abdomen dated 06/13/2022. CLINICAL HISTORY: Polytrauma, blunt. FINDINGS: CHEST: MEDIASTINUM AND LYMPH NODES: The heart is mildly enlarged. Pericardium is unremarkable. The central airways are clear. No mediastinal, hilar or axillary lymphadenopathy. LUNGS AND PLEURA: Mild-to-moderate central lobular and paraseptal emphysema. There are streaky peribronchovascular opacities present within the lower lobes bilaterally. There is also mild bronchiectasis. No focal consolidation or pulmonary edema. No pleural effusion or pneumothorax. ABDOMEN AND PELVIS: LIVER: There is a vascular blush present within the periphery of segment 7 of the liver. There is also a small cyst within segment 6. GALLBLADDER AND BILE DUCTS: Gallbladder is unremarkable. No biliary ductal dilatation. SPLEEN: No acute abnormality. PANCREAS: No acute abnormality. ADRENAL GLANDS: No acute abnormality. KIDNEYS, URETERS AND BLADDER: There is a hemorrhagic cyst again demonstrated in the superior pole of the  right kidney. The left kidney is surgically absent. No stones in the right kidney or ureter. No hydronephrosis. No perinephric or periureteral stranding. Urinary bladder is unremarkable. GI AND BOWEL: Stomach demonstrates no acute abnormality. There is no bowel obstruction. REPRODUCTIVE ORGANS: The patient is status post prostatectomy. PERITONEUM AND RETROPERITONEUM: No ascites. No free air. VASCULATURE: Aorta is normal in caliber. There is extensive calcific atheromatous disease throughout the thoracic aorta. ABDOMINAL AND PELVIS LYMPH NODES: No lymphadenopathy. BONES AND SOFT TISSUES: The bones  appear intact. No acute osseous abnormality. No focal soft tissue abnormality. There is no evidence of acute traumatic injury. IMPRESSION: 1. No evidence of acute traumatic injury. 2. Mild-to-moderate centrilobular and paraseptal emphysema. Given emphysema is an independent lung cancer risk factor, consider evaluation for eligibility for a low-dose CT lung cancer screening program. 3. Streaky peribronchovascular opacities in the lower lobes bilaterally with mild bronchiectasis. 4. Mild cardiomegaly. 5. Extensive calcific atheromatous disease throughout the thoracic aorta. 6. Vascular blush in the periphery of segment 7 of the liver and a small cyst in segment 6. 7. Hemorrhagic cyst in the superior pole of the right kidney. Left kidney is surgically absent. Electronically signed by: Evalene Coho MD 02/22/2024 11:47 AM EST RP Workstation: [id number]   DG Pelvis Portable Result Date: 02/22/2024 CLINICAL DATA:  Fall down stairs.  Pelvic pain. EXAM: PORTABLE PELVIS 1-2 VIEWS COMPARISON:  None Available. FINDINGS: There is no evidence of pelvic fracture or diastasis. No pelvic bone lesions are seen. Bilateral iliac and peripheral vascular calcification noted. IMPRESSION: No acute findings. Electronically Signed   By: Norleen DELENA Kil M.D.   On: 02/22/2024 09:19   DG Chest Port 1 View Result Date: 02/22/2024 CLINICAL DATA:   Fall down stairs. EXAM: PORTABLE CHEST 1 VIEW COMPARISON:  03/04/2022 FINDINGS: The heart size and mediastinal contours are within normal limits. Cardiac loop recorder again seen. Decreased lung volumes are seen with mild bibasilar atelectasis. No evidence of pulmonary consolidation or pleural effusion. No evidence of pneumothorax or hemothorax. Both lungs are clear. The visualized skeletal structures are unremarkable. IMPRESSION: Low lung volumes with mild bibasilar atelectasis. Electronically Signed   By: Norleen DELENA Kil M.D.   On: 02/22/2024 09:18       Elgie Butter M.D. [facility] 02/23/2024, 2:23 PM  Available via Epic secure chat 7am-7pm After 7 pm, please refer to night coverage provider listed on amion.

## 2024-02-24 DIAGNOSIS — G9341 Metabolic encephalopathy: Secondary | ICD-10-CM | POA: Diagnosis not present

## 2024-02-24 DIAGNOSIS — N1831 Chronic kidney disease, stage 3a: Secondary | ICD-10-CM | POA: Diagnosis not present

## 2024-02-24 DIAGNOSIS — E781 Pure hyperglyceridemia: Secondary | ICD-10-CM

## 2024-02-24 DIAGNOSIS — N3001 Acute cystitis with hematuria: Secondary | ICD-10-CM | POA: Diagnosis not present

## 2024-02-24 DIAGNOSIS — R001 Bradycardia, unspecified: Secondary | ICD-10-CM | POA: Diagnosis not present

## 2024-02-24 DIAGNOSIS — J439 Emphysema, unspecified: Secondary | ICD-10-CM | POA: Diagnosis not present

## 2024-02-24 LAB — CBC WITH DIFFERENTIAL/PLATELET
Abs Immature Granulocytes: 0.04 K/uL (ref 0.00–0.07)
Basophils Absolute: 0.1 K/uL (ref 0.0–0.1)
Basophils Relative: 1 %
Eosinophils Absolute: 0.4 K/uL (ref 0.0–0.5)
Eosinophils Relative: 3 %
HCT: 37.8 % — ABNORMAL LOW (ref 39.0–52.0)
Hemoglobin: 12.1 g/dL — ABNORMAL LOW (ref 13.0–17.0)
Immature Granulocytes: 0 %
Lymphocytes Relative: 21 %
Lymphs Abs: 2.2 K/uL (ref 0.7–4.0)
MCH: 30.5 pg (ref 26.0–34.0)
MCHC: 32 g/dL (ref 30.0–36.0)
MCV: 95.2 fL (ref 80.0–100.0)
Monocytes Absolute: 1.2 K/uL — ABNORMAL HIGH (ref 0.1–1.0)
Monocytes Relative: 11 %
Neutro Abs: 6.7 K/uL (ref 1.7–7.7)
Neutrophils Relative %: 64 %
Platelets: 222 K/uL (ref 150–400)
RBC: 3.97 MIL/uL — ABNORMAL LOW (ref 4.22–5.81)
RDW: 15.2 % (ref 11.5–15.5)
WBC: 10.5 K/uL (ref 4.0–10.5)
nRBC: 0 % (ref 0.0–0.2)

## 2024-02-24 LAB — BASIC METABOLIC PANEL WITH GFR
Anion gap: 8 (ref 5–15)
BUN: 20 mg/dL (ref 8–23)
CO2: 24 mmol/L (ref 22–32)
Calcium: 9.4 mg/dL (ref 8.9–10.3)
Chloride: 105 mmol/L (ref 98–111)
Creatinine, Ser: 1.46 mg/dL — ABNORMAL HIGH (ref 0.61–1.24)
GFR, Estimated: 49 mL/min — ABNORMAL LOW (ref >60)
Glucose, Bld: 106 mg/dL — ABNORMAL HIGH (ref 70–99)
Potassium: 3.6 mmol/L (ref 3.5–5.1)
Sodium: 137 mmol/L (ref 135–145)

## 2024-02-24 MED ORDER — CEPHALEXIN 500 MG PO CAPS: 500.0000 mg | ORAL_CAPSULE | Freq: Two times a day (BID) | ORAL | 0 refills | Status: AC

## 2024-02-24 MED ORDER — FENOFIBRATE 54 MG PO TABS: 54.0000 mg | ORAL_TABLET | Freq: Every morning | ORAL | Status: DC

## 2024-02-24 MED ORDER — HYDRALAZINE HCL 25 MG PO TABS: 25.0000 mg | ORAL_TABLET | Freq: Three times a day (TID) | ORAL | 1 refills | Status: AC

## 2024-02-24 MED ORDER — HYDRALAZINE HCL 25 MG PO TABS
25.0000 mg | ORAL_TABLET | Freq: Three times a day (TID) | ORAL | Status: DC
  Administered 2024-02-24: 25 mg via ORAL
  Filled 2024-02-24: qty 1

## 2024-02-24 MED ORDER — FENOFIBRATE 54 MG PO TABS: 54.0000 mg | ORAL_TABLET | Freq: Every morning | ORAL | 1 refills | Status: AC

## 2024-02-24 NOTE — Discharge Summary (Signed)
 Physician Discharge Summary   Patient: Bobby Nguyen MRN: [id number] DOB: [birth year]  Admit date:     02/22/2024  Discharge date: 02/24/24  Discharge Physician: Elgie Butter   PCP: Stephanie Charlene CROME, MD   Recommendations at discharge:  Please follow up with PCP in one week.   Discharge Diagnoses: Principal Problem:   Acute metabolic encephalopathy Active Problems:   Chronic pain syndrome   Acute cystitis   Bilateral atelectasis   CKD stage 3a, GFR 45-59 ml/min (HCC)   Emphysema lung (HCC)   Thoracic vertebral fracture (HCC)   Aortic atherosclerosis   Gout   Contusion of right elbow   Abrasion of right elbow   Contusion of auricle of left ear   Abrasion of left ear  Resolved Problems:   * No resolved hospital problems. *  Hospital Course: Bobby Nguyen is a 78 y.o. male with medical history significant of aortic atherosclerosis, osteoarthritis, back pain, BPH, bladder cancer, CKD, chronic pain syndrome, emphysema/COPD, depression, gout, hyperlipidemia, hypertension, history of pneumonia, renal cancer, left nephrectomy, history of tobacco use, history of ischemic CVA who was brought to the emergency department after having a fall at home and being found with altered mental status by his wife who provides that portion of the HPI as the patient does not have good recollection of the events of this morning.  He normally is sleeps downstairs, but does not remember entirely how he went upstairs and fell. He was recently treated for UTI with nitrofurantoin.    CT chest/abdomen/pelvis with contrast showing mild cardiomegaly.  Mild to moderate centrilobular and paraseptal emphysema.  Streaky peribronchovascular opacities in the lower lobes bilaterally.  Mild bronchiectasis.  No edema or focal consolidation.  No effusion or pneumothorax.  Thoracic aorta atheromatous calcification.  Hepatic cysts.  Hemorrhagic cyst in the superior pole of the right kidney.  Surgically absent left kidney.   Thoracic spine showed chronic compression deformity of T6 treated with bilateral vertebral augmentation.  Chronic nonunited fracture deformities of the spinous processes of T1, T2 and T4.  No acute fracture.  CT lumbar spine with no acute findings.  There is moderate chronic DDD L5-S1.  Extensive atheromatous disease of abdominal aorta.   He was admitted for possible recurrent UTI and multiple falls.   Assessment and Plan:   Recurrent cystitis Recently completed nitrofurantoin.  Follow up urine and blood cultures.  Continue with IV Ceftriaxone .  Cultures from urine have been negative. Will discharge her on oral keflex  to complete the course.  Recommend outpatient follow up with urology in view of his bladder cancer.        Emphysema:  With some mild bronchiectasis and acute bronchitis.  Discharged on keflex .        Contusion of the left ear from the fall.  Pain control.      Falls with episodes of bradycardia.  Holding atenolol .  Cardiology consulted for recommendations.  Echocardiogram ordered and reviewed with the patient.  30 day monitor ordered by cardiology.  Outpatient follow up with cardiology recommended and scheduled.     Involuntary movements of the legs.  Holding lexapro . Discussed with the patient 's daughter and son .      Stage 3 a CKD Creatinine at 1.3, much better than baseline.  S/p left nephrectomy.      Hypertension Suboptimal, continue with norvasc  and added hydralazine  25 mg TID.      Hyperlipidemia  decreased the dose of fenofibrate  to 54 mg daily.  Tobacco abuse Counseled on cessation.  Nicotine  patch ordered.    H/o osteoarthritis Pain control and physical therapy.      Estimated body mass index is 26.15 kg/m as calculated from the following:   Height as of this encounter: 5' 6 (1.676 m).   Weight as of this encounter: 73.5 kg.     Consultants: cardiology.  Procedures performed: echo  Disposition: Home Diet recommendation:   Discharge Diet Orders (From admission, onward)     Start     Ordered   02/24/24 0000  Diet - low sodium heart healthy        02/24/24 1246           Regular diet DISCHARGE MEDICATION: Allergies as of 02/24/2024       Reactions   Atorvastatin Other (See Comments)   constipation   Pravastatin Sodium Other (See Comments)   sweating   Solifenacin Other (See Comments)   sweating   Penicillins Itching, Swelling, Rash   Peripheral swelling        Medication List     STOP taking these medications    atenolol  50 MG tablet Commonly known as: TENORMIN    escitalopram  10 MG tablet Commonly known as: LEXAPRO    nitrofurantoin (macrocrystal-monohydrate) 100 MG capsule Commonly known as: MACROBID       TAKE these medications    allopurinol  300 MG tablet Commonly known as: ZYLOPRIM  Take 300 mg by mouth in the morning.   amLODipine  10 MG tablet Commonly known as: NORVASC  Take 10 mg by mouth in the morning.   bisacodyl  5 MG EC tablet Commonly known as: DULCOLAX Take 10 mg by mouth daily as needed for mild constipation or moderate constipation.   cephALEXin  500 MG capsule Commonly known as: KEFLEX  Take 1 capsule (500 mg total) by mouth 2 (two) times daily for 5 days. Notes to patient: Take with food   ciclopirox  8 % solution Commonly known as: PENLAC  Apply topically 2 (two) times daily as needed. Apply over nail and surrounding skin. Apply daily over previous coat. After seven (7) days, may remove with alcohol and continue cycle.   fenofibrate  54 MG tablet Take 1 tablet (54 mg total) by mouth in the morning. Start taking on: February 25, 2024 What changed:  medication strength how much to take   hydrALAZINE  25 MG tablet Commonly known as: APRESOLINE  Take 1 tablet (25 mg total) by mouth every 8 (eight) hours. Notes to patient: 6 am, 2 pm & 10 pm   oxybutynin  5 MG tablet Commonly known as: DITROPAN  Take 1 tablet (5 mg total) by mouth every 8 (eight) hours  as needed for bladder spasms.   oxyCODONE -acetaminophen  10-325 MG tablet Commonly known as: PERCOCET Take 1 tablet by mouth in the morning, at noon, and at bedtime.   phenazopyridine  200 MG tablet Commonly known as: Pyridium  Take 1 tablet (200 mg total) by mouth 3 (three) times daily as needed (for pain with urination).        Follow-up Information     Tobb, Kardie, DO Follow up.   Specialty: Cardiology Contact information: [address] [place] [address] [phone]                Discharge Exam: Bobby Nguyen   02/22/24 0730  Weight: 73.5 kg   General exam: Appears calm and comfortable  Respiratory system: Clear to auscultation. Respiratory effort normal. Cardiovascular system: S1 & S2 heard, RRR. Gastrointestinal system: Abdomen is nondistended, soft and nontender.  Central nervous system:  Alert and oriented. Extremities: Symmetric 5 x 5 power. Skin: No rashes,  Psychiatry: Mood & affect appropriate.    Condition at discharge: fair  The results of significant diagnostics from this hospitalization (including imaging, microbiology, ancillary and laboratory) are listed below for reference.   Imaging Studies: ECHOCARDIOGRAM COMPLETE Result Date: 02/23/2024    ECHOCARDIOGRAM REPORT   Patient Name:   Bobby Nguyen Date of Exam: 02/23/2024 Medical Rec #:  [id number]      Height:       66.0 in Accession #:    [id number]     Weight:       162.0 lb Date of Birth:  1945/05/31      BSA:          1.828 m Patient Age:    78 years       BP:           153/56 mmHg Patient Gender: M              HR:           61 bpm. Exam Location:  Inpatient Procedure: 2D Echo (Both Spectral and Color Flow Doppler were utilized during            procedure). Indications:    Sycope  History:        Patient has prior history of Echocardiogram examinations.                 Signs/Symptoms:Syncope.  Sonographer:    Norleen Amour Referring Phys: [id number] ZANE ADAMS IMPRESSIONS  1. Left  ventricular ejection fraction, by estimation, is 55 to 60%. Left ventricular ejection fraction by 2D MOD biplane is 60.7 %. The left ventricle has normal function. The left ventricle has no regional wall motion abnormalities. Left ventricular diastolic parameters are consistent with Grade I diastolic dysfunction (impaired relaxation).  2. Right ventricular systolic function is normal. The right ventricular size is normal. Tricuspid regurgitation signal is inadequate for assessing PA pressure.  3. The mitral valve is normal in structure. No evidence of mitral valve regurgitation. No evidence of mitral stenosis.  4. The aortic valve is tricuspid. Aortic valve regurgitation is trivial. No aortic stenosis is present.  5. The inferior vena cava is normal in size with greater than 50% respiratory variability, suggesting right atrial pressure of 3 mmHg. Comparison(s): No significant change from prior study. Conclusion(s)/Recommendation(s): No cause for syncope identified by this study. FINDINGS  Left Ventricle: Left ventricular ejection fraction, by estimation, is 55 to 60%. Left ventricular ejection fraction by 2D MOD biplane is 60.7 %. The left ventricle has normal function. The left ventricle has no regional wall motion abnormalities. The left ventricular internal cavity size was normal in size. There is borderline left ventricular hypertrophy. Left ventricular diastolic parameters are consistent with Grade I diastolic dysfunction (impaired relaxation). Right Ventricle: The right ventricular size is normal. No increase in right ventricular wall thickness. Right ventricular systolic function is normal. Tricuspid regurgitation signal is inadequate for assessing PA pressure. Left Atrium: Left atrial size was normal in size. Right Atrium: Right atrial size was normal in size. Pericardium: There is no evidence of pericardial effusion. Mitral Valve: The mitral valve is normal in structure. Mild mitral annular calcification.  No evidence of mitral valve regurgitation. No evidence of mitral valve stenosis. MV peak gradient, 2.8 mmHg. The mean mitral valve gradient is 1.0 mmHg. Tricuspid Valve: The tricuspid valve is normal in structure. Tricuspid valve regurgitation is trivial. No evidence of tricuspid stenosis.  Aortic Valve: The aortic valve is tricuspid. Aortic valve regurgitation is trivial. Aortic regurgitation PHT measures 744 msec. No aortic stenosis is present. Aortic valve mean gradient measures 5.0 mmHg. Aortic valve peak gradient measures 9.6 mmHg. Aortic valve area, by VTI measures 2.46 cm. Pulmonic Valve: The pulmonic valve was normal in structure. Pulmonic valve regurgitation is trivial. No evidence of pulmonic stenosis. Aorta: The aortic root and ascending aorta are structurally normal, with no evidence of dilitation. Venous: The inferior vena cava is normal in size with greater than 50% respiratory variability, suggesting right atrial pressure of 3 mmHg. IAS/Shunts: No atrial level shunt detected by color flow Doppler. Additional Comments: A device lead is visualized.  LEFT VENTRICLE PLAX 2D                        Biplane EF (MOD) LVIDd:         4.30 cm         LV Biplane EF:   Left LVIDs:         2.50 cm                          ventricular LV PW:         0.90 cm                          ejection LV IVS:        1.10 cm                          fraction by LVOT diam:     1.90 cm                          2D MOD LV SV:         90                               biplane is LV SV Index:   49                               60.7 %. LVOT Area:     2.84 cm                                Diastology                                LV e' medial:    7.29 cm/s LV Volumes (MOD)               LV E/e' medial:  10.2 LV vol d, MOD    70.1 ml       LV e' lateral:   9.46 cm/s A2C:                           LV E/e' lateral: 7.8 LV vol d, MOD    84.7 ml A4C: LV vol s, MOD    28.8 ml A2C: LV vol s, MOD    30.8 ml A4C: LV SV MOD A2C:   41.3 ml LV SV  MOD A4C:  84.7 ml LV SV MOD BP:    47.5 ml RIGHT VENTRICLE             IVC RV Basal diam:  3.70 cm     IVC diam: 1.30 cm RV S prime:     12.30 cm/s TAPSE (M-mode): 3.4 cm      PULMONARY VEINS                             Diastolic Velocity: 48.40 cm/s                             S/D Velocity:       0.60                             Systolic Velocity:  30.00 cm/s LEFT ATRIUM             Index        RIGHT ATRIUM           Index LA diam:        4.20 cm 2.30 cm/m   RA Area:     18.90 cm LA Vol (A2C):   38.9 ml 21.27 ml/m  RA Volume:   49.20 ml  26.91 ml/m LA Vol (A4C):   63.6 ml 34.78 ml/m LA Biplane Vol: 50.3 ml 27.51 ml/m  AORTIC VALVE                     PULMONIC VALVE AV Area (Vmax):    2.27 cm      PV Vmax:       1.07 m/s AV Area (Vmean):   2.42 cm      PV Peak grad:  4.6 mmHg AV Area (VTI):     2.46 cm AV Vmax:           155.00 cm/s AV Vmean:          104.000 cm/s AV VTI:            0.367 m AV Peak Grad:      9.6 mmHg AV Mean Grad:      5.0 mmHg LVOT Vmax:         124.00 cm/s LVOT Vmean:        88.900 cm/s LVOT VTI:          0.318 m LVOT/AV VTI ratio: 0.87 AI PHT:            744 msec  AORTA Ao Root diam: 3.00 cm Ao Asc diam:  2.80 cm MITRAL VALVE MV Area (PHT): 2.36 cm    SHUNTS MV Area VTI:   2.93 cm    Systemic VTI:  0.32 m MV Peak grad:  2.8 mmHg    Systemic Diam: 1.90 cm MV Mean grad:  1.0 mmHg MV Vmax:       0.83 m/s MV Vmean:      49.5 cm/s MV Decel Time: 321 msec MV E velocity: 74.10 cm/s MV A velocity: 93.40 cm/s MV E/A ratio:  0.79 Georganna Archer Electronically signed by Georganna Archer Signature Date/Time: 02/23/2024/2:49:07 PM    Final    CT T-SPINE NO CHARGE Result Date: 02/22/2024 EXAM: CT THORACIC SPINE WITHOUT CONTRAST 02/22/2024 11:34:35 AM TECHNIQUE: CT of the thoracic spine was performed without the administration of intravenous contrast. Multiplanar reformatted images are provided for review. Automated  exposure control, iterative reconstruction, and/or weight based adjustment of  the mA/kV was utilized to reduce the radiation dose to as low as reasonably achievable. COMPARISON: None available. CLINICAL HISTORY: FINDINGS: BONES AND ALIGNMENT: There is a chronic compression deformity of T6 which has been treated with bilateral vertebral augmentation. There are chronic nonunited fracture deformities of the spinous processes of T1, T2 and T4. No acute fracture or suspicious bone lesion. Normal alignment. DEGENERATIVE CHANGES: There are anterior and lateral osteophytes present throughout the thoracic spine. SOFT TISSUES: No acute abnormality. IMPRESSION: 1. Chronic compression deformity of T6 treated with bilateral vertebral augmentation. 2. Chronic nonunited fracture deformities of the spinous processes of T1, T2, and T4. 3. No acute fracture. Electronically signed by: Evalene Coho MD 02/22/2024 12:07 PM EST RP Workstation: [id number]   CT L-SPINE NO CHARGE Result Date: 02/22/2024 EXAM: CT OF THE LUMBAR SPINE WITHOUT CONTRAST 02/22/2024 11:34:35 AM TECHNIQUE: CT of the lumbar spine was performed without the administration of intravenous contrast. Multiplanar reformatted images are provided for review. Automated exposure control, iterative reconstruction, and/or weight based adjustment of the mA/kV was utilized to reduce the radiation dose to as low as reasonably achievable. COMPARISON: None available. CLINICAL HISTORY: FINDINGS: BONES AND ALIGNMENT: Normal vertebral body heights. There is no evidence of fracture. There is a nonunited deformity of the left transverse process of L1. No suspicious bone lesion. Normal alignment. DEGENERATIVE CHANGES: There is moderate chronic degenerative disc disease at L5-S1. SOFT TISSUES: The patient is status post left nephrectomy. There is extensive atheromatous disease within the abdominal aorta. IMPRESSION: 1. No acute findings. 2. Moderate chronic degenerative disc disease at L5-S1. 3. Status post left nephrectomy. 4. Extensive atheromatous disease  of the abdominal aorta. Electronically signed by: Evalene Coho MD 02/22/2024 12:04 PM EST RP Workstation: [id number]   CT HEAD WO CONTRAST Result Date: 02/22/2024 EXAM: CT HEAD AND CERVICAL SPINE 02/22/2024 11:34:35 AM TECHNIQUE: CT of the head and cervical spine was performed without the administration of intravenous contrast. Multiplanar reformatted images are provided for review. Automated exposure control, iterative reconstruction, and/or weight based adjustment of the mA/kV was utilized to reduce the radiation dose to as low as reasonably achievable. COMPARISON: CT Head and CT cervical spine June 19, 2007 CLINICAL HISTORY: Head trauma, moderate-severe FINDINGS: CT HEAD BRAIN AND VENTRICLES: No acute intracranial hemorrhage. No mass effect or midline shift. No abnormal extra-axial fluid collection. No evidence of acute infarct. Remote right MCA territory infarct, new since 2009. No hydrocephalus. ORBITS: No acute abnormality. SINUSES AND MASTOIDS: No acute abnormality. SOFT TISSUES AND SKULL: No acute skull fracture. No acute soft tissue abnormality. CT CERVICAL SPINE BONES AND ALIGNMENT: No acute fracture or traumatic malalignment. Subtle sequelae of remote lower cervical and upper thoracic fractures. DEGENERATIVE CHANGES: Mild for age bony degenerative change. SOFT TISSUES: No prevertebral soft tissue swelling. IMPRESSION: 1. No acute intracranial abnormality. 2. No acute fracture or traumatic malalignment of the cervical spine. 3. Remote right MCA territory infarct, new since 2009. Electronically signed by: Gilmore Molt MD 02/22/2024 11:49 AM EST RP Workstation: [id number]   CT CERVICAL SPINE WO CONTRAST Result Date: 02/22/2024 EXAM: CT HEAD AND CERVICAL SPINE 02/22/2024 11:34:35 AM TECHNIQUE: CT of the head and cervical spine was performed without the administration of intravenous contrast. Multiplanar reformatted images are provided for review. Automated exposure control, iterative  reconstruction, and/or weight based adjustment of the mA/kV was utilized to reduce the radiation dose to as low as reasonably achievable. COMPARISON: CT Head and CT cervical spine June 19, 2007 CLINICAL HISTORY: Head trauma, moderate-severe FINDINGS: CT HEAD BRAIN AND VENTRICLES: No acute intracranial hemorrhage. No mass effect or midline shift. No abnormal extra-axial fluid collection. No evidence of acute infarct. Remote right MCA territory infarct, new since 2009. No hydrocephalus. ORBITS: No acute abnormality. SINUSES AND MASTOIDS: No acute abnormality. SOFT TISSUES AND SKULL: No acute skull fracture. No acute soft tissue abnormality. CT CERVICAL SPINE BONES AND ALIGNMENT: No acute fracture or traumatic malalignment. Subtle sequelae of remote lower cervical and upper thoracic fractures. DEGENERATIVE CHANGES: Mild for age bony degenerative change. SOFT TISSUES: No prevertebral soft tissue swelling. IMPRESSION: 1. No acute intracranial abnormality. 2. No acute fracture or traumatic malalignment of the cervical spine. 3. Remote right MCA territory infarct, new since 2009. Electronically signed by: Gilmore Molt MD 02/22/2024 11:49 AM EST RP Workstation: [id number]   CT CHEST ABDOMEN PELVIS W CONTRAST Result Date: 02/22/2024 EXAM: CT CHEST, ABDOMEN AND PELVIS WITH CONTRAST 02/22/2024 11:34:35 AM TECHNIQUE: CT of the chest, abdomen and pelvis was performed with the administration of 80 mL of iohexol  (OMNIPAQUE ) 300 MG/ML solution. Multiplanar reformatted images are provided for review. Automated exposure control, iterative reconstruction, and/or weight based adjustment of the mA/kV was utilized to reduce the radiation dose to as low as reasonably achievable. COMPARISON: CT of the chest dated 07/24/2023 and CT of the abdomen dated 06/13/2022. CLINICAL HISTORY: Polytrauma, blunt. FINDINGS: CHEST: MEDIASTINUM AND LYMPH NODES: The heart is mildly enlarged. Pericardium is unremarkable. The central airways are  clear. No mediastinal, hilar or axillary lymphadenopathy. LUNGS AND PLEURA: Mild-to-moderate central lobular and paraseptal emphysema. There are streaky peribronchovascular opacities present within the lower lobes bilaterally. There is also mild bronchiectasis. No focal consolidation or pulmonary edema. No pleural effusion or pneumothorax. ABDOMEN AND PELVIS: LIVER: There is a vascular blush present within the periphery of segment 7 of the liver. There is also a small cyst within segment 6. GALLBLADDER AND BILE DUCTS: Gallbladder is unremarkable. No biliary ductal dilatation. SPLEEN: No acute abnormality. PANCREAS: No acute abnormality. ADRENAL GLANDS: No acute abnormality. KIDNEYS, URETERS AND BLADDER: There is a hemorrhagic cyst again demonstrated in the superior pole of the right kidney. The left kidney is surgically absent. No stones in the right kidney or ureter. No hydronephrosis. No perinephric or periureteral stranding. Urinary bladder is unremarkable. GI AND BOWEL: Stomach demonstrates no acute abnormality. There is no bowel obstruction. REPRODUCTIVE ORGANS: The patient is status post prostatectomy. PERITONEUM AND RETROPERITONEUM: No ascites. No free air. VASCULATURE: Aorta is normal in caliber. There is extensive calcific atheromatous disease throughout the thoracic aorta. ABDOMINAL AND PELVIS LYMPH NODES: No lymphadenopathy. BONES AND SOFT TISSUES: The bones appear intact. No acute osseous abnormality. No focal soft tissue abnormality. There is no evidence of acute traumatic injury. IMPRESSION: 1. No evidence of acute traumatic injury. 2. Mild-to-moderate centrilobular and paraseptal emphysema. Given emphysema is an independent lung cancer risk factor, consider evaluation for eligibility for a low-dose CT lung cancer screening program. 3. Streaky peribronchovascular opacities in the lower lobes bilaterally with mild bronchiectasis. 4. Mild cardiomegaly. 5. Extensive calcific atheromatous disease  throughout the thoracic aorta. 6. Vascular blush in the periphery of segment 7 of the liver and a small cyst in segment 6. 7. Hemorrhagic cyst in the superior pole of the right kidney. Left kidney is surgically absent. Electronically signed by: Evalene Coho MD 02/22/2024 11:47 AM EST RP Workstation: [id number]   DG Pelvis Portable Result Date: 02/22/2024 CLINICAL DATA:  Fall down stairs.  Pelvic pain. EXAM: PORTABLE PELVIS 1-2 VIEWS  COMPARISON:  None Available. FINDINGS: There is no evidence of pelvic fracture or diastasis. No pelvic bone lesions are seen. Bilateral iliac and peripheral vascular calcification noted. IMPRESSION: No acute findings. Electronically Signed   By: Norleen DELENA Kil M.D.   On: 02/22/2024 09:19   DG Chest Port 1 View Result Date: 02/22/2024 CLINICAL DATA:  Fall down stairs. EXAM: PORTABLE CHEST 1 VIEW COMPARISON:  03/04/2022 FINDINGS: The heart size and mediastinal contours are within normal limits. Cardiac loop recorder again seen. Decreased lung volumes are seen with mild bibasilar atelectasis. No evidence of pulmonary consolidation or pleural effusion. No evidence of pneumothorax or hemothorax. Both lungs are clear. The visualized skeletal structures are unremarkable. IMPRESSION: Low lung volumes with mild bibasilar atelectasis. Electronically Signed   By: Norleen DELENA Kil M.D.   On: 02/22/2024 09:18    Microbiology: Results for orders placed or performed during the hospital encounter of 02/22/24  Blood Culture (routine x 2)     Status: None (Preliminary result)   Collection Time: 02/22/24  9:05 AM   Specimen: BLOOD  Result Value Ref Range Status   Specimen Description   Final    BLOOD BLOOD RIGHT FOREARM Performed at [facility], [address], [place], [place] [address]    Special Requests   Final    BOTTLES DRAWN AEROBIC AND ANAEROBIC Blood Culture adequate volume Performed at [facility], [address] [place], [place]  [address]    Culture   Final    NO GROWTH 2 DAYS Performed at [facility], [address], [place], [place] [address]    Report Status PENDING  Incomplete  Urine Culture     Status: None   Collection Time: 02/22/24 10:05 AM   Specimen: Urine, Random  Result Value Ref Range Status   Specimen Description   Final    URINE, RANDOM Performed at [facility], [address] [place], [place] [address]    Special Requests   Final    NONE Reflexed from [id number] Performed at [facility], [address], [place], [place] [address]    Culture   Final    NO GROWTH Performed at [facility], [address], [place], [place] [address]    Report Status 02/23/2024 FINAL  Final  Blood Culture (routine x 2)     Status: None (Preliminary result)   Collection Time: 02/22/24  2:45 PM   Specimen: BLOOD LEFT ARM  Result Value Ref Range Status   Specimen Description   Final    BLOOD LEFT ARM Performed at [facility], [address], [place], [place] [address]    Special Requests   Final    BOTTLES DRAWN AEROBIC ONLY Blood Culture adequate volume Performed at [facility], [address], [place], [place] [address]    Culture   Final    NO GROWTH 2 DAYS Performed at [facility], [address] [place], [place] [address]    Report Status PENDING  Incomplete    Labs: CBC: Recent Labs  Lab 02/22/24 0905 02/23/24 0356 02/24/24 0331  WBC 12.6* 11.5* 10.5  NEUTROABS 9.5*  --  6.7  HGB 14.6 12.5* 12.1*  HCT 44.6 38.6* 37.8*  MCV 94.3 95.5 95.2  PLT 255 217 222   Basic Metabolic Panel: Recent Labs  Lab 02/22/24 0905 02/23/24 0356 02/24/24 0331  NA 139 140 137  K 3.9 3.6 3.6  CL 103  105 105  CO2 23 24 24   GLUCOSE 101* 95 106*  BUN 23 19 20   CREATININE 1.46* 1.33* 1.46*  CALCIUM 10.0 9.3 9.4  MG  --  2.0  --    Liver Function Tests: Recent Labs  Lab 02/22/24 0905 02/23/24 0356  AST 25 23  ALT  <5 <5  ALKPHOS 65 58  BILITOT 0.5 0.4  PROT 7.8 6.6  ALBUMIN 4.3 3.7   CBG: No results for input(s): GLUCAP in the last 168 hours.  Discharge time spent: 39 minutes.   Signed: Elgie Butter, MD [facility] 02/24/2024

## 2024-02-24 NOTE — Progress Notes (Signed)
 AVS reviewed w/ patient and family in room who verbalized an understanding. No other questions at this time. Patient will discharge to home after IV antibiotics are complete. Patient's son will have another family member pick up discharge meds from the pharmacy in [place], [place].

## 2024-02-24 NOTE — Progress Notes (Signed)
 DAILY PROGRESS NOTE   Patient Name: Bobby Nguyen Date of Encounter: 02/24/2024 Cardiologist: Kardie Tobb, DO  Chief Complaint   No complaints  Patient Profile   Bobby Nguyen is a 78 y.o. male with a hx of asymptomatic bradycardia, cryptogenic stroke, and bladder tumor who is being seen 02/23/2024 for the evaluation of bradycardia at the request of Elgie Butter MD.   Subjective   HR improved overnight - now in the 60's. Frequent PVC's noted. Systolic BP elevated, but low diastolic pressures. Echo yesterday showed LVEF 55-60%, trivial AI.  Objective   Vitals:   02/24/24 0006 02/24/24 0100 02/24/24 0420 02/24/24 0725  BP: (!) 138/55  (!) 158/59 (!) 169/64  Pulse: 61  (!) 57 67  Resp: 15 18 16 20   Temp: 98.7 F (37.1 C)  98.6 F (37 C) 97.8 F (36.6 C)  TempSrc: Oral  Oral Oral  SpO2: 93%  94% 94%  Weight:      Height:        Intake/Output Summary (Last 24 hours) at 02/24/2024 9076 Last data filed at 02/24/2024 0840 Gross per 24 hour  Intake 210.91 ml  Output 950 ml  Net -739.09 ml   Filed Weights   02/22/24 0730  Weight: 73.5 kg    Physical Exam   General appearance: alert and no distress Lungs: clear to auscultation bilaterally Heart: regular rate and rhythm, S1, S2 normal, no murmur, click, rub or gallop Extremities: extremities normal, atraumatic, no cyanosis or edema Neurologic: Grossly normal  Inpatient Medications    Scheduled Meds:  allopurinol   300 mg Oral q AM   amLODipine   10 mg Oral Daily   enoxaparin  (LOVENOX ) injection  40 mg Subcutaneous Q24H   fenofibrate   160 mg Oral q AM   nicotine   14 mg Transdermal Daily    Continuous Infusions:  azithromycin  250 mL/hr at 02/23/24 1500   cefTRIAXone  (ROCEPHIN )  IV Stopped (02/23/24 1401)    PRN Meds: acetaminophen  **OR** acetaminophen , ondansetron  **OR** ondansetron  (ZOFRAN ) IV, phenol   Labs   Results for orders placed or performed during the hospital encounter of 02/22/24 (from the  past 48 hours)  Urinalysis, w/ Reflex to Culture (Infection Suspected) -Urine, Clean Catch     Status: Abnormal   Collection Time: 02/22/24 10:05 AM  Result Value Ref Range   Specimen Source URINE, CLEAN CATCH    Color, Urine AMBER (A) YELLOW    Comment: BIOCHEMICALS MAY BE AFFECTED BY COLOR   APPearance CLEAR CLEAR   Specific Gravity, Urine 1.006 1.005 - 1.030   pH 6.0 5.0 - 8.0   Glucose, UA NEGATIVE NEGATIVE mg/dL   Hgb urine dipstick SMALL (A) NEGATIVE   Bilirubin Urine NEGATIVE NEGATIVE   Ketones, ur NEGATIVE NEGATIVE mg/dL   Protein, ur 30 (A) NEGATIVE mg/dL   Nitrite POSITIVE (A) NEGATIVE   Leukocytes,Ua MODERATE (A) NEGATIVE   RBC / HPF 0-5 0 - 5 RBC/hpf   WBC, UA >50 0 - 5 WBC/hpf    Comment:        Reflex urine culture not performed if WBC <=10, OR if Squamous epithelial cells >5. If Squamous epithelial cells >5 suggest recollection.    Bacteria, UA RARE (A) NONE SEEN   Squamous Epithelial / HPF 0-5 0 - 5 /HPF    Comment: Performed at [facility], [address], [place], [place] [address]  Urine Culture     Status: None   Collection Time: 02/22/24 10:05 AM  Specimen: Urine, Random  Result Value Ref Range   Specimen Description      URINE, RANDOM Performed at [facility], [address], [place], [place] [address]    Special Requests      NONE Reflexed from [id number] Performed at [facility], [address] [place], [place] [address]    Culture      NO GROWTH Performed at [facility], [address] [place], [place] [address]    Report Status 02/23/2024 FINAL   Blood Culture (routine x 2)     Status: None (Preliminary result)   Collection Time: 02/22/24  2:45 PM   Specimen: BLOOD LEFT ARM  Result Value Ref Range   Specimen Description      BLOOD LEFT ARM Performed at [facility], [address] [place], [place] [address]    Special Requests      BOTTLES DRAWN AEROBIC ONLY Blood  Culture adequate volume Performed at [facility], [address], [place], [place] [address]    Culture      NO GROWTH < 24 HOURS Performed at [facility], [address] [place], [place] [address]    Report Status PENDING   Troponin T, High Sensitivity     Status: None   Collection Time: 02/22/24  2:45 PM  Result Value Ref Range   Troponin T High Sensitivity 18 0 - 19 ng/L    Comment: (NOTE) Biotin concentrations > 1000 ng/mL falsely decrease TnT results.  Serial cardiac troponin measurements are suggested.  Refer to the Links section for chest pain algorithms and additional  guidance. Performed at [facility], [address] [place], [place] [address]   CBC     Status: Abnormal   Collection Time: 02/23/24  3:56 AM  Result Value Ref Range   WBC 11.5 (H) 4.0 - 10.5 K/uL   RBC 4.04 (L) 4.22 - 5.81 MIL/uL   Hemoglobin 12.5 (L) 13.0 - 17.0 g/dL   HCT 61.3 (L) 60.9 - 47.9 %   MCV 95.5 80.0 - 100.0 fL   MCH 30.9 26.0 - 34.0 pg   MCHC 32.4 30.0 - 36.0 g/dL   RDW 84.9 88.4 - 84.4 %   Platelets 217 150 - 400 K/uL   nRBC 0.0 0.0 - 0.2 %    Comment: Performed at [facility], [address] [place], [place] [address]  Comprehensive metabolic panel     Status: Abnormal   Collection Time: 02/23/24  3:56 AM  Result Value Ref Range   Sodium 140 135 - 145 mmol/L   Potassium 3.6 3.5 - 5.1 mmol/L   Chloride 105 98 - 111 mmol/L   CO2 24 22 - 32 mmol/L   Glucose, Bld 95 70 - 99 mg/dL    Comment: Glucose reference range applies only to samples taken after fasting for at least 8 hours.   BUN 19 8 - 23 mg/dL   Creatinine, Ser 8.66 (H) 0.61 - 1.24 mg/dL   Calcium 9.3 8.9 - 89.6 mg/dL   Total Protein 6.6 6.5 - 8.1 g/dL   Albumin 3.7 3.5 - 5.0 g/dL   AST 23 15 - 41 U/L   ALT <5 0 - 44 U/L   Alkaline Phosphatase 58 38 - 126 U/L   Total Bilirubin 0.4 0.0 - 1.2 mg/dL   GFR, Estimated 55 (L) >60 mL/min    Comment:  (NOTE) Calculated using the CKD-EPI Creatinine  Equation (2021)    Anion gap 11 5 - 15    Comment: Performed at [facility], [address], [place], [place] [address]  Magnesium     Status: None   Collection Time: 02/23/24  3:56 AM  Result Value Ref Range   Magnesium 2.0 1.7 - 2.4 mg/dL    Comment: Performed at [facility], [address] [place], [place] [address]  CBC with Differential/Platelet     Status: Abnormal   Collection Time: 02/24/24  3:31 AM  Result Value Ref Range   WBC 10.5 4.0 - 10.5 K/uL   RBC 3.97 (L) 4.22 - 5.81 MIL/uL   Hemoglobin 12.1 (L) 13.0 - 17.0 g/dL   HCT 62.1 (L) 60.9 - 47.9 %   MCV 95.2 80.0 - 100.0 fL   MCH 30.5 26.0 - 34.0 pg   MCHC 32.0 30.0 - 36.0 g/dL   RDW 84.7 88.4 - 84.4 %   Platelets 222 150 - 400 K/uL   nRBC 0.0 0.0 - 0.2 %   Neutrophils Relative % 64 %   Neutro Abs 6.7 1.7 - 7.7 K/uL   Lymphocytes Relative 21 %   Lymphs Abs 2.2 0.7 - 4.0 K/uL   Monocytes Relative 11 %   Monocytes Absolute 1.2 (H) 0.1 - 1.0 K/uL   Eosinophils Relative 3 %   Eosinophils Absolute 0.4 0.0 - 0.5 K/uL   Basophils Relative 1 %   Basophils Absolute 0.1 0.0 - 0.1 K/uL   Immature Granulocytes 0 %   Abs Immature Granulocytes 0.04 0.00 - 0.07 K/uL    Comment: Performed at [facility], [address] [place], [place] [address]  Basic metabolic panel with GFR     Status: Abnormal   Collection Time: 02/24/24  3:31 AM  Result Value Ref Range   Sodium 137 135 - 145 mmol/L   Potassium 3.6 3.5 - 5.1 mmol/L   Chloride 105 98 - 111 mmol/L   CO2 24 22 - 32 mmol/L   Glucose, Bld 106 (H) 70 - 99 mg/dL    Comment: Glucose reference range applies only to samples taken after fasting for at least 8 hours.   BUN 20 8 - 23 mg/dL   Creatinine, Ser 8.53 (H) 0.61 - 1.24 mg/dL   Calcium 9.4 8.9 - 89.6 mg/dL   GFR, Estimated 49 (L) >60 mL/min    Comment: (NOTE) Calculated using the CKD-EPI Creatinine Equation (2021)     Anion gap 8 5 - 15    Comment: Performed at [facility], [address], [place], [place] [address]    ECG   N/A  Telemetry   Sinus rhythm with PVC's - Personally Reviewed  Radiology    ECHOCARDIOGRAM COMPLETE Result Date: 02/23/2024    ECHOCARDIOGRAM REPORT   Patient Name:   KIONTE BAUMGARDNER Date of Exam: 02/23/2024 Medical Rec #:  [id number]      Height:       66.0 in Accession #:    [id number]     Weight:       162.0 lb Date of Birth:  02-06-1946      BSA:          1.828 m Patient Age:    78 years       BP:           153/56 mmHg Patient Gender: M              HR:  61 bpm. Exam Location:  Inpatient Procedure: 2D Echo (Both Spectral and Color Flow Doppler were utilized during            procedure). Indications:    Sycope  History:        Patient has prior history of Echocardiogram examinations.                 Signs/Symptoms:Syncope.  Sonographer:    Norleen Amour Referring Phys: [id number] ZANE ADAMS IMPRESSIONS  1. Left ventricular ejection fraction, by estimation, is 55 to 60%. Left ventricular ejection fraction by 2D MOD biplane is 60.7 %. The left ventricle has normal function. The left ventricle has no regional wall motion abnormalities. Left ventricular diastolic parameters are consistent with Grade I diastolic dysfunction (impaired relaxation).  2. Right ventricular systolic function is normal. The right ventricular size is normal. Tricuspid regurgitation signal is inadequate for assessing PA pressure.  3. The mitral valve is normal in structure. No evidence of mitral valve regurgitation. No evidence of mitral stenosis.  4. The aortic valve is tricuspid. Aortic valve regurgitation is trivial. No aortic stenosis is present.  5. The inferior vena cava is normal in size with greater than 50% respiratory variability, suggesting right atrial pressure of 3 mmHg. Comparison(s): No significant change from prior study. Conclusion(s)/Recommendation(s): No cause for syncope  identified by this study. FINDINGS  Left Ventricle: Left ventricular ejection fraction, by estimation, is 55 to 60%. Left ventricular ejection fraction by 2D MOD biplane is 60.7 %. The left ventricle has normal function. The left ventricle has no regional wall motion abnormalities. The left ventricular internal cavity size was normal in size. There is borderline left ventricular hypertrophy. Left ventricular diastolic parameters are consistent with Grade I diastolic dysfunction (impaired relaxation). Right Ventricle: The right ventricular size is normal. No increase in right ventricular wall thickness. Right ventricular systolic function is normal. Tricuspid regurgitation signal is inadequate for assessing PA pressure. Left Atrium: Left atrial size was normal in size. Right Atrium: Right atrial size was normal in size. Pericardium: There is no evidence of pericardial effusion. Mitral Valve: The mitral valve is normal in structure. Mild mitral annular calcification. No evidence of mitral valve regurgitation. No evidence of mitral valve stenosis. MV peak gradient, 2.8 mmHg. The mean mitral valve gradient is 1.0 mmHg. Tricuspid Valve: The tricuspid valve is normal in structure. Tricuspid valve regurgitation is trivial. No evidence of tricuspid stenosis. Aortic Valve: The aortic valve is tricuspid. Aortic valve regurgitation is trivial. Aortic regurgitation PHT measures 744 msec. No aortic stenosis is present. Aortic valve mean gradient measures 5.0 mmHg. Aortic valve peak gradient measures 9.6 mmHg. Aortic valve area, by VTI measures 2.46 cm. Pulmonic Valve: The pulmonic valve was normal in structure. Pulmonic valve regurgitation is trivial. No evidence of pulmonic stenosis. Aorta: The aortic root and ascending aorta are structurally normal, with no evidence of dilitation. Venous: The inferior vena cava is normal in size with greater than 50% respiratory variability, suggesting right atrial pressure of 3 mmHg.  IAS/Shunts: No atrial level shunt detected by color flow Doppler. Additional Comments: A device lead is visualized.  LEFT VENTRICLE PLAX 2D                        Biplane EF (MOD) LVIDd:         4.30 cm         LV Biplane EF:   Left LVIDs:         2.50 cm  ventricular LV PW:         0.90 cm                          ejection LV IVS:        1.10 cm                          fraction by LVOT diam:     1.90 cm                          2D MOD LV SV:         90                               biplane is LV SV Index:   49                               60.7 %. LVOT Area:     2.84 cm                                Diastology                                LV e' medial:    7.29 cm/s LV Volumes (MOD)               LV E/e' medial:  10.2 LV vol d, MOD    70.1 ml       LV e' lateral:   9.46 cm/s A2C:                           LV E/e' lateral: 7.8 LV vol d, MOD    84.7 ml A4C: LV vol s, MOD    28.8 ml A2C: LV vol s, MOD    30.8 ml A4C: LV SV MOD A2C:   41.3 ml LV SV MOD A4C:   84.7 ml LV SV MOD BP:    47.5 ml RIGHT VENTRICLE             IVC RV Basal diam:  3.70 cm     IVC diam: 1.30 cm RV S prime:     12.30 cm/s TAPSE (M-mode): 3.4 cm      PULMONARY VEINS                             Diastolic Velocity: 48.40 cm/s                             S/D Velocity:       0.60                             Systolic Velocity:  30.00 cm/s LEFT ATRIUM             Index        RIGHT ATRIUM           Index LA diam:        4.20 cm 2.30  cm/m   RA Area:     18.90 cm LA Vol (A2C):   38.9 ml 21.27 ml/m  RA Volume:   49.20 ml  26.91 ml/m LA Vol (A4C):   63.6 ml 34.78 ml/m LA Biplane Vol: 50.3 ml 27.51 ml/m  AORTIC VALVE                     PULMONIC VALVE AV Area (Vmax):    2.27 cm      PV Vmax:       1.07 m/s AV Area (Vmean):   2.42 cm      PV Peak grad:  4.6 mmHg AV Area (VTI):     2.46 cm AV Vmax:           155.00 cm/s AV Vmean:          104.000 cm/s AV VTI:            0.367 m AV Peak Grad:      9.6 mmHg AV Mean Grad:       5.0 mmHg LVOT Vmax:         124.00 cm/s LVOT Vmean:        88.900 cm/s LVOT VTI:          0.318 m LVOT/AV VTI ratio: 0.87 AI PHT:            744 msec  AORTA Ao Root diam: 3.00 cm Ao Asc diam:  2.80 cm MITRAL VALVE MV Area (PHT): 2.36 cm    SHUNTS MV Area VTI:   2.93 cm    Systemic VTI:  0.32 m MV Peak grad:  2.8 mmHg    Systemic Diam: 1.90 cm MV Mean grad:  1.0 mmHg MV Vmax:       0.83 m/s MV Vmean:      49.5 cm/s MV Decel Time: 321 msec MV E velocity: 74.10 cm/s MV A velocity: 93.40 cm/s MV E/A ratio:  0.79 Georganna Archer Electronically signed by Georganna Archer Signature Date/Time: 02/23/2024/2:49:07 PM    Final    CT T-SPINE NO CHARGE Result Date: 02/22/2024 EXAM: CT THORACIC SPINE WITHOUT CONTRAST 02/22/2024 11:34:35 AM TECHNIQUE: CT of the thoracic spine was performed without the administration of intravenous contrast. Multiplanar reformatted images are provided for review. Automated exposure control, iterative reconstruction, and/or weight based adjustment of the mA/kV was utilized to reduce the radiation dose to as low as reasonably achievable. COMPARISON: None available. CLINICAL HISTORY: FINDINGS: BONES AND ALIGNMENT: There is a chronic compression deformity of T6 which has been treated with bilateral vertebral augmentation. There are chronic nonunited fracture deformities of the spinous processes of T1, T2 and T4. No acute fracture or suspicious bone lesion. Normal alignment. DEGENERATIVE CHANGES: There are anterior and lateral osteophytes present throughout the thoracic spine. SOFT TISSUES: No acute abnormality. IMPRESSION: 1. Chronic compression deformity of T6 treated with bilateral vertebral augmentation. 2. Chronic nonunited fracture deformities of the spinous processes of T1, T2, and T4. 3. No acute fracture. Electronically signed by: Evalene Coho MD 02/22/2024 12:07 PM EST RP Workstation: [id number]   CT L-SPINE NO CHARGE Result Date: 02/22/2024 EXAM: CT OF THE LUMBAR SPINE WITHOUT  CONTRAST 02/22/2024 11:34:35 AM TECHNIQUE: CT of the lumbar spine was performed without the administration of intravenous contrast. Multiplanar reformatted images are provided for review. Automated exposure control, iterative reconstruction, and/or weight based adjustment of the mA/kV was utilized to reduce the radiation dose to as low as reasonably achievable. COMPARISON: None available. CLINICAL HISTORY: FINDINGS: BONES  AND ALIGNMENT: Normal vertebral body heights. There is no evidence of fracture. There is a nonunited deformity of the left transverse process of L1. No suspicious bone lesion. Normal alignment. DEGENERATIVE CHANGES: There is moderate chronic degenerative disc disease at L5-S1. SOFT TISSUES: The patient is status post left nephrectomy. There is extensive atheromatous disease within the abdominal aorta. IMPRESSION: 1. No acute findings. 2. Moderate chronic degenerative disc disease at L5-S1. 3. Status post left nephrectomy. 4. Extensive atheromatous disease of the abdominal aorta. Electronically signed by: Evalene Coho MD 02/22/2024 12:04 PM EST RP Workstation: [id number]   CT HEAD WO CONTRAST Result Date: 02/22/2024 EXAM: CT HEAD AND CERVICAL SPINE 02/22/2024 11:34:35 AM TECHNIQUE: CT of the head and cervical spine was performed without the administration of intravenous contrast. Multiplanar reformatted images are provided for review. Automated exposure control, iterative reconstruction, and/or weight based adjustment of the mA/kV was utilized to reduce the radiation dose to as low as reasonably achievable. COMPARISON: CT Head and CT cervical spine June 19, 2007 CLINICAL HISTORY: Head trauma, moderate-severe FINDINGS: CT HEAD BRAIN AND VENTRICLES: No acute intracranial hemorrhage. No mass effect or midline shift. No abnormal extra-axial fluid collection. No evidence of acute infarct. Remote right MCA territory infarct, new since 2009. No hydrocephalus. ORBITS: No acute abnormality. SINUSES  AND MASTOIDS: No acute abnormality. SOFT TISSUES AND SKULL: No acute skull fracture. No acute soft tissue abnormality. CT CERVICAL SPINE BONES AND ALIGNMENT: No acute fracture or traumatic malalignment. Subtle sequelae of remote lower cervical and upper thoracic fractures. DEGENERATIVE CHANGES: Mild for age bony degenerative change. SOFT TISSUES: No prevertebral soft tissue swelling. IMPRESSION: 1. No acute intracranial abnormality. 2. No acute fracture or traumatic malalignment of the cervical spine. 3. Remote right MCA territory infarct, new since 2009. Electronically signed by: Gilmore Molt MD 02/22/2024 11:49 AM EST RP Workstation: [id number]   CT CERVICAL SPINE WO CONTRAST Result Date: 02/22/2024 EXAM: CT HEAD AND CERVICAL SPINE 02/22/2024 11:34:35 AM TECHNIQUE: CT of the head and cervical spine was performed without the administration of intravenous contrast. Multiplanar reformatted images are provided for review. Automated exposure control, iterative reconstruction, and/or weight based adjustment of the mA/kV was utilized to reduce the radiation dose to as low as reasonably achievable. COMPARISON: CT Head and CT cervical spine June 19, 2007 CLINICAL HISTORY: Head trauma, moderate-severe FINDINGS: CT HEAD BRAIN AND VENTRICLES: No acute intracranial hemorrhage. No mass effect or midline shift. No abnormal extra-axial fluid collection. No evidence of acute infarct. Remote right MCA territory infarct, new since 2009. No hydrocephalus. ORBITS: No acute abnormality. SINUSES AND MASTOIDS: No acute abnormality. SOFT TISSUES AND SKULL: No acute skull fracture. No acute soft tissue abnormality. CT CERVICAL SPINE BONES AND ALIGNMENT: No acute fracture or traumatic malalignment. Subtle sequelae of remote lower cervical and upper thoracic fractures. DEGENERATIVE CHANGES: Mild for age bony degenerative change. SOFT TISSUES: No prevertebral soft tissue swelling. IMPRESSION: 1. No acute intracranial abnormality. 2.  No acute fracture or traumatic malalignment of the cervical spine. 3. Remote right MCA territory infarct, new since 2009. Electronically signed by: Gilmore Molt MD 02/22/2024 11:49 AM EST RP Workstation: [id number]   CT CHEST ABDOMEN PELVIS W CONTRAST Result Date: 02/22/2024 EXAM: CT CHEST, ABDOMEN AND PELVIS WITH CONTRAST 02/22/2024 11:34:35 AM TECHNIQUE: CT of the chest, abdomen and pelvis was performed with the administration of 80 mL of iohexol  (OMNIPAQUE ) 300 MG/ML solution. Multiplanar reformatted images are provided for review. Automated exposure control, iterative reconstruction, and/or weight based adjustment of the mA/kV was utilized to reduce  the radiation dose to as low as reasonably achievable. COMPARISON: CT of the chest dated 07/24/2023 and CT of the abdomen dated 06/13/2022. CLINICAL HISTORY: Polytrauma, blunt. FINDINGS: CHEST: MEDIASTINUM AND LYMPH NODES: The heart is mildly enlarged. Pericardium is unremarkable. The central airways are clear. No mediastinal, hilar or axillary lymphadenopathy. LUNGS AND PLEURA: Mild-to-moderate central lobular and paraseptal emphysema. There are streaky peribronchovascular opacities present within the lower lobes bilaterally. There is also mild bronchiectasis. No focal consolidation or pulmonary edema. No pleural effusion or pneumothorax. ABDOMEN AND PELVIS: LIVER: There is a vascular blush present within the periphery of segment 7 of the liver. There is also a small cyst within segment 6. GALLBLADDER AND BILE DUCTS: Gallbladder is unremarkable. No biliary ductal dilatation. SPLEEN: No acute abnormality. PANCREAS: No acute abnormality. ADRENAL GLANDS: No acute abnormality. KIDNEYS, URETERS AND BLADDER: There is a hemorrhagic cyst again demonstrated in the superior pole of the right kidney. The left kidney is surgically absent. No stones in the right kidney or ureter. No hydronephrosis. No perinephric or periureteral stranding. Urinary bladder is unremarkable.  GI AND BOWEL: Stomach demonstrates no acute abnormality. There is no bowel obstruction. REPRODUCTIVE ORGANS: The patient is status post prostatectomy. PERITONEUM AND RETROPERITONEUM: No ascites. No free air. VASCULATURE: Aorta is normal in caliber. There is extensive calcific atheromatous disease throughout the thoracic aorta. ABDOMINAL AND PELVIS LYMPH NODES: No lymphadenopathy. BONES AND SOFT TISSUES: The bones appear intact. No acute osseous abnormality. No focal soft tissue abnormality. There is no evidence of acute traumatic injury. IMPRESSION: 1. No evidence of acute traumatic injury. 2. Mild-to-moderate centrilobular and paraseptal emphysema. Given emphysema is an independent lung cancer risk factor, consider evaluation for eligibility for a low-dose CT lung cancer screening program. 3. Streaky peribronchovascular opacities in the lower lobes bilaterally with mild bronchiectasis. 4. Mild cardiomegaly. 5. Extensive calcific atheromatous disease throughout the thoracic aorta. 6. Vascular blush in the periphery of segment 7 of the liver and a small cyst in segment 6. 7. Hemorrhagic cyst in the superior pole of the right kidney. Left kidney is surgically absent. Electronically signed by: Evalene Coho MD 02/22/2024 11:47 AM EST RP Workstation: [id number]    Cardiac Studies   See echo above  Assessment   Principal Problem:   Acute metabolic encephalopathy Active Problems:   Chronic pain syndrome   Acute cystitis   Bilateral atelectasis   CKD stage 3a, GFR 45-59 ml/min (HCC)   Emphysema lung (HCC)   Thoracic vertebral fracture (HCC)   Aortic atherosclerosis   Gout   Contusion of right elbow   Abrasion of right elbow   Contusion of auricle of left ear   Abrasion of left ear   Plan   HR improved off BB, however, noted to have frequent PVC's - may ultimately need AAD suppression - agree with plan for outpatient monitor which has been ordered. Echo is unchanged from previous, reassuring  normal LVEF, trivial AI - wide pulse pressure with systolic hypertension. On amlodipine  - will add low dose hydralazine 25 mg TID. Has Stage 3a CKD - GFR <60, will need dose reduction of fenofibrate  to 54 mg daily. Check fasting lipid profile in am.  Time Spent Directly with Patient:  I have spent a total of 35 minutes with the patient reviewing hospital notes, telemetry, EKGs, labs and examining the patient as well as establishing an assessment and plan that was discussed personally with the patient.  > 50% of time was spent in direct patient care.  Length of Stay:  LOS: 2 days   Vinie KYM Maxcy, MD, [facility], FNLA, FACP  [place]  [facility]  Medical Director of the Advanced [facility] Diplomate of the [employer] Board of Clinical Lipidology Attending Cardiologist  Direct Dial: [phone]  Fax: [phone]  Website:  [email or web address] Vinie BROCKS Dillan Lunden 02/24/2024, 9:23 AM

## 2024-02-24 NOTE — Evaluation (Addendum)
 Physical Therapy Evaluation-1x Patient Details Name: Bobby Nguyen MRN: [id number] DOB: June 02, 1945 Today's Date: 02/24/2024  History of Present Illness  78 yo male admitted with recurrent cystitis, back pain. Pt was found down on floor at home. Hx of CVA, renal cell Ca, CKD, chronic pain, COPD, gout, chronic vertebral fxs.  Clinical Impression  On eval, pt was Mod Ind with mobility. He ambulated ~270 feet around the unit. He reports he has been to/from bathroom in room with supervision from nurse. He tolerated activity well. No LOB during session. Pt denied dizziness. 1x eval. Will sign off.         If plan is discharge home, recommend the following:     Can travel by private vehicle        Equipment Recommendations None recommended by PT  Recommendations for Other Services       Functional Status Assessment Patient has not had a recent decline in their functional status     Precautions / Restrictions Precautions Precautions: Fall Restrictions Weight Bearing Restrictions Per Provider Order: No      Mobility  Bed Mobility Overal bed mobility: Modified Independent                  Transfers Overall transfer level: Modified independent                      Ambulation/Gait Ambulation/Gait assistance: Modified independent (Device/Increase time) Gait Distance (Feet): 270 Feet Assistive device: None Gait Pattern/deviations: Step-through pattern       General Gait Details: Pt denied dizziness, dyspnea. No LOB. Good steady pace. Tolerated distance well.  Stairs            Wheelchair Mobility     Tilt Bed    Modified Rankin (Stroke Patients Only)       Balance Overall balance assessment: No apparent balance deficits (not formally assessed)                                           Pertinent Vitals/Pain Pain Assessment Pain Assessment: No/denies pain    Home Living Family/patient expects to be discharged to::  Private residence Living Arrangements: Spouse/significant other   Type of Home: House Home Access: Stairs to enter       Home Layout: Two level;Able to live on main level with bedroom/bathroom Home Equipment: Rolling Walker (2 wheels);Cane - single point;Shower seat;BSC/3in1      Prior Function               Mobility Comments: mod ind ADLs Comments: mod ind     Extremity/Trunk Assessment   Upper Extremity Assessment Upper Extremity Assessment: Overall WFL for tasks assessed    Lower Extremity Assessment Lower Extremity Assessment: Overall WFL for tasks assessed    Cervical / Trunk Assessment Cervical / Trunk Assessment: Normal  Communication   Communication Communication: Impaired Factors Affecting Communication: Hearing impaired    Cognition Arousal: Alert Behavior During Therapy: WFL for tasks assessed/performed   PT - Cognitive impairments: No apparent impairments                         Following commands: Intact       Cueing Cueing Techniques: Verbal cues     General Comments      Exercises     Assessment/Plan    PT Assessment  Patient does not need any further PT services  PT Problem List         PT Treatment Interventions      PT Goals (Current goals can be found in the Care Plan section)  Acute Rehab PT Goals Patient Stated Goal: home PT Goal Formulation: All assessment and education complete, DC therapy    Frequency       Co-evaluation               AM-PAC PT 6 Clicks Mobility  Outcome Measure Help needed turning from your back to your side while in a flat bed without using bedrails?: None Help needed moving from lying on your back to sitting on the side of a flat bed without using bedrails?: None Help needed moving to and from a bed to a chair (including a wheelchair)?: None Help needed standing up from a chair using your arms (e.g., wheelchair or bedside chair)?: None Help needed to walk in hospital  room?: None Help needed climbing 3-5 steps with a railing? : None 6 Click Score: 24    End of Session Equipment Utilized During Treatment: Gait belt Activity Tolerance: Patient tolerated treatment well Patient left: with call bell/phone within reach;with bed alarm set;with family/visitor present        Time: 8855-8840 PT Time Calculation (min) (ACUTE ONLY): 15 min   Charges:   PT Evaluation $PT Eval Low Complexity: 1 Low   PT General Charges $$ ACUTE PT VISIT: 1 Visit           Dannial SQUIBB, PT Acute Rehabilitation  Office: [phone]

## 2024-02-26 ENCOUNTER — Encounter: Payer: Self-pay | Admitting: *Deleted

## 2024-02-27 LAB — CULTURE, BLOOD (ROUTINE X 2)
Culture: NO GROWTH
Culture: NO GROWTH
Special Requests: ADEQUATE
Special Requests: ADEQUATE

## 2024-03-11 ENCOUNTER — Telehealth: Payer: Self-pay | Admitting: Cardiology

## 2024-03-11 NOTE — Telephone Encounter (Signed)
 Pt daughter called in, pt lost box and stickers to use heart monitor. Pt requesting a call back to determine when he should be scheduled for heart monitor follow up. Please advise.

## 2024-03-11 NOTE — Telephone Encounter (Signed)
 Patient's daughter Eleanor states she believes her mother (patient's spouse) may have accidentally thrown away the box for the heart monitor containing the adhesive strips and return label. Patient has the device with the phone, but no adhesive.  Melissa provided serial number for heart monitor device: [id number]  Will forward to monitor tech for assistance.  Scheduled new patient appt with Dr. Sheena for 05/09/24 at 9:20 AM.

## 2024-03-11 NOTE — Telephone Encounter (Signed)
 [employer] contacted to send additional strips, box to return monitor and prepaid return UPS bag to patient.  Informed of new patient appointment date of 05/09/2024 at 9:20 AM.

## 2024-04-04 ENCOUNTER — Ambulatory Visit: Attending: Student

## 2024-04-04 DIAGNOSIS — R55 Syncope and collapse: Secondary | ICD-10-CM | POA: Diagnosis not present

## 2024-04-05 ENCOUNTER — Ambulatory Visit: Payer: Self-pay | Admitting: Student

## 2024-04-10 NOTE — Progress Notes (Signed)
 Left message for pt to call back to discuss.

## 2024-05-09 ENCOUNTER — Ambulatory Visit: Admitting: Cardiology

## 2024-05-09 ENCOUNTER — Ambulatory Visit: Admitting: Podiatry
